# Patient Record
Sex: Male | Born: 1945 | Race: White | Hispanic: No | Marital: Married | Smoking: Former smoker
Health system: Southern US, Community
[De-identification: ages and names within clinical notes are randomized; demographics above are authoritative.]

## PROBLEM LIST (undated history)

## (undated) DIAGNOSIS — E669 Obesity, unspecified: Secondary | ICD-10-CM

## (undated) DIAGNOSIS — I4581 Long QT syndrome: Secondary | ICD-10-CM

## (undated) DIAGNOSIS — R609 Edema, unspecified: Secondary | ICD-10-CM

## (undated) DIAGNOSIS — E079 Disorder of thyroid, unspecified: Secondary | ICD-10-CM

## (undated) DIAGNOSIS — Z950 Presence of cardiac pacemaker: Secondary | ICD-10-CM

## (undated) DIAGNOSIS — R55 Syncope and collapse: Secondary | ICD-10-CM

## (undated) DIAGNOSIS — I442 Atrioventricular block, complete: Secondary | ICD-10-CM

## (undated) DIAGNOSIS — G4733 Obstructive sleep apnea (adult) (pediatric): Secondary | ICD-10-CM

## (undated) DIAGNOSIS — I10 Essential (primary) hypertension: Secondary | ICD-10-CM

## (undated) HISTORY — DX: Atrioventricular block, complete: I44.2

## (undated) HISTORY — DX: Long QT syndrome: I45.81

## (undated) HISTORY — DX: Obstructive sleep apnea (adult) (pediatric): G47.33

## (undated) HISTORY — DX: Disorder of thyroid, unspecified: E07.9

## (undated) HISTORY — DX: Essential (primary) hypertension: I10

## (undated) HISTORY — DX: Syncope and collapse: R55

## (undated) HISTORY — DX: Presence of cardiac pacemaker: Z95.0

## (undated) HISTORY — DX: Obesity, unspecified: E66.9

## (undated) HISTORY — DX: Edema, unspecified: R60.9

---

## 2005-05-23 ENCOUNTER — Ambulatory Visit: Payer: Self-pay | Admitting: Internal Medicine

## 2011-12-12 ENCOUNTER — Encounter: Payer: Self-pay | Admitting: Internal Medicine

## 2011-12-12 ENCOUNTER — Ambulatory Visit (INDEPENDENT_AMBULATORY_CARE_PROVIDER_SITE_OTHER): Payer: Self-pay | Admitting: Internal Medicine

## 2011-12-12 VITALS — BP 140/92 | HR 51 | Ht 72.0 in | Wt 288.0 lb

## 2011-12-12 DIAGNOSIS — I4581 Long QT syndrome: Secondary | ICD-10-CM | POA: Insufficient documentation

## 2011-12-12 DIAGNOSIS — R55 Syncope and collapse: Secondary | ICD-10-CM

## 2011-12-12 DIAGNOSIS — I10 Essential (primary) hypertension: Secondary | ICD-10-CM

## 2011-12-12 DIAGNOSIS — Z87898 Personal history of other specified conditions: Secondary | ICD-10-CM | POA: Insufficient documentation

## 2011-12-12 MED ORDER — SPIRONOLACTONE 25 MG PO TABS: 25.0000 mg | ORAL_TABLET | Freq: Every day | ORAL | Status: DC

## 2011-12-12 NOTE — Assessment & Plan Note (Signed)
The spells that they have described in the past have been relatively long in duration and unlikely related to polymorphic ventricular tachycardia. This most recent episode with his much more abrupt onset and full loss of consciousness could have been. The fact that he was unresponsive for many many minutes would make this less likely and more likely neurally mediated as I think is the mechanism of the previous spells.  However, it's hard to be sure and I recommended the insertion of a loop recorder for diagnostic purposes.

## 2011-12-12 NOTE — Assessment & Plan Note (Signed)
I suggested that he pursue the treatment for his sleep apnea and will change his diuretics as noted above to be potassium sparing.

## 2011-12-12 NOTE — Patient Instructions (Addendum)
Your physician has recommended you make the following change in your medication: STOP TRIAMTERENE/HCTZ AND START ON ALDACTONE (SPIRONOLACTONE) 25 MG 1 TABLET DAILY  Your physician recommends that you return for lab work in: TODAY BMET, MAGNESIUM   Your physician recommends that you schedule a follow-up appointment in: 4-6 WEEKS WITH DR. Graciela Husbands IN THE [place] OFFICE PER DR. Graciela Husbands

## 2011-12-12 NOTE — Progress Notes (Signed)
History and Physical  Patient ID: William Woods MRN: [id number], SOB: August 20, 1946 66 y.o. Date of Encounter: 12/12/2011, 4:30 PM  Primary Physician: Andree Elk, MD, MD Primary Cardiologist:  Primary Electrophysiologist:  sk  Chief Complaint: long QT syndrome  History of Present Illness: William Woods is a 66 y.o. male gentleman who I met in 2006. Just prior to that he had been seen at [facility] because of a chest pain syndrome and an abnormal electro-cardiogram. This prompted catheterization demonstrating no obstructive disease and normal left ventricular function.  He was seen then by Dr. Wynona Luna and was felt to have QT prolongation. His father apparently cured a similar diagnosis. I  He has profound fatigue because of atenolol at that time which I discontinued and he then was referred back to [facility]. There is also a comment in the charts related to hypertrophic heart disease. He had QRS prolongation and I have not seen him since.   He comes in today to reestablish care.  He had an episode of syncope about 4 or 5 weeks ago. He been feeling fine. He lifted a box out of of his trunk and carried it into the house. He weighed about 60 pounds. He put it on the counter and then fainted. He was out for some number of minutes. EMS was called. First vital signs are recorded as normal.  He has 2 children. Both of his sons has died. One of cancer and one for homan autopsy demonstrated cocaine and to which this was attributed. His brother and nephew were apparently evaluated and the diagnosis was unclear.  He has had a couple of other episodes of weakness. These have occurred in the setting of relative dehydration. The first occurred at the beach after he got badly sunburned in the setting of Claritin the other in the context of a low calorie diet.  His modest shortness of breath. He has some peripheral edema. He is being managed currently with potassium wasting diuretic taken in conjunction with  triamterene. He also has gout.   Past Medical History  Diagnosis Date  . Long Q-T syndrome   . Hypertension   . Thyroid disease      Past Surgical History  Procedure Date  . Cardiac catheterization     [facility]       Current Outpatient Prescriptions  Medication Sig Dispense Refill  . anastrozole (ARIMIDEX) 1 MG tablet Take 0.5 mg by mouth daily.      Marland Kitchen thyroid (ARMOUR) 120 MG tablet Take 120 mg by mouth daily.      Marland Kitchen spironolactone (ALDACTONE) 25 MG tablet Take 1 tablet (25 mg total) by mouth daily.  90 tablet  3     Allergies: No Known Allergies   History  Substance Use Topics  . Smoking status: Former Smoker -- 1.0 packs/day for 28 years    Types: Cigarettes  . Smokeless tobacco: Former Neurosurgeon    Quit date: 12/11/1997  . Alcohol Use: No      History reviewed. No pertinent family history.    ROS:  Please see the history of present illness.     All other systems reviewed and negative.   Vital Signs: Blood pressure 140/92, pulse 51, height 6' (1.829 m), weight 288 lb (130.636 kg).  PHYSICAL EXAM: General:  Well nourished, obese Caucasian male in no acute distress  HEENT: normal Lymph: no adenopathy Neck: no JVD Endocrine:  No thryomegaly Vascular: No carotid bruits; FA pulses 2+ bilaterally without bruits Cardiac:  normal S1, S2; RRR; no murmur Back: without kyphosis/scoliosis, no CVA tenderness Lungs:  clear to auscultation bilaterally, no wheezing, rhonchi or rales Abd: soft, nontender, no hepatomegaly Ext: 1-2+ edema Musculoskeletal:  No deformities, BUE and BLE strength normal and equal Skin: warm and dry Neuro:  CNs 2-12 intact, no focal abnormalities noted Psych:  Normal affect   EKG:  Sinus rhythm at 51 with intervals of 0.22/0.15/0.62 with a QTC of 0.55 JT. interval on today's tracings are 500 ms He also is right bundle branch block and left anterior fascicular block in conjunction with voltage criteria suggestive of hypertrophy.  Labs:        ASSESSMENT AND PLAN:     Duke Salvia, MD  12/12/2011, 4:30 PM

## 2011-12-12 NOTE — Assessment & Plan Note (Addendum)
The patient has QT intervals of more than 506 100 ms. This is true even with a mildly broad QRS and QT interval is 500 ms. The family carries a diagnosis of long Q-T I don't know if this is been verified. The patient's sons have both died. There is however very broad family on his father's side who is considered the source of the Gene.  I have discussed with them the importance of reviewing all drugs with the website QT drugs.org. Will discontinue his potassium wasting diuretic and will start him on spironolactone. I have suggested that we undertake his straining. They have no insurance and we would need to use [employer].    We will check his potassium and magnesium today.

## 2012-01-14 ENCOUNTER — Ambulatory Visit: Payer: Self-pay | Admitting: Internal Medicine

## 2012-01-19 ENCOUNTER — Observation Stay: Payer: Self-pay | Admitting: Internal Medicine

## 2012-01-19 DIAGNOSIS — I359 Nonrheumatic aortic valve disorder, unspecified: Secondary | ICD-10-CM

## 2012-01-19 DIAGNOSIS — R55 Syncope and collapse: Secondary | ICD-10-CM

## 2012-01-19 LAB — BASIC METABOLIC PANEL
Anion Gap: 9 (ref 7–16)
BUN: 19 mg/dL — ABNORMAL HIGH (ref 7–18)
Calcium, Total: 8.9 mg/dL (ref 8.5–10.1)
Chloride: 106 mmol/L (ref 98–107)
Co2: 29 mmol/L (ref 21–32)
Creatinine: 1.32 mg/dL — ABNORMAL HIGH (ref 0.60–1.30)
EGFR (African American): >60
EGFR (Non-African Amer.): 58 — ABNORMAL LOW
Glucose: 118 mg/dL — ABNORMAL HIGH (ref 65–99)
Osmolality: 290 (ref 275–301)
Potassium: 4.1 mmol/L (ref 3.5–5.1)
Sodium: 144 mmol/L (ref 136–145)

## 2012-01-19 LAB — APTT: Activated PTT: 27.7 secs (ref 23.6–35.9)

## 2012-01-19 LAB — CBC
HCT: 42 % (ref 40.0–52.0)
HGB: 13.6 g/dL (ref 13.0–18.0)
MCH: 26.4 pg (ref 26.0–34.0)
MCHC: 32.4 g/dL (ref 32.0–36.0)
MCV: 82 fL (ref 80–100)
Platelet: 123 10*3/uL — ABNORMAL LOW (ref 150–440)
RBC: 5.16 10*6/uL (ref 4.40–5.90)
RDW: 14.1 % (ref 11.5–14.5)
WBC: 6.1 10*3/uL (ref 3.8–10.6)

## 2012-01-19 LAB — TROPONIN I
Troponin-I: 0.06 ng/mL — ABNORMAL HIGH
Troponin-I: 0.18 ng/mL — ABNORMAL HIGH

## 2012-01-19 LAB — PRO B NATRIURETIC PEPTIDE: B-Type Natriuretic Peptide: 244 pg/mL — ABNORMAL HIGH (ref 0–125)

## 2012-01-20 DIAGNOSIS — I200 Unstable angina: Secondary | ICD-10-CM

## 2012-01-20 HISTORY — PX: CARDIAC CATHETERIZATION: SHX172

## 2012-01-20 LAB — BASIC METABOLIC PANEL
Anion Gap: 6 — ABNORMAL LOW (ref 7–16)
BUN: 21 mg/dL — ABNORMAL HIGH (ref 7–18)
Calcium, Total: 8.5 mg/dL (ref 8.5–10.1)
Chloride: 107 mmol/L (ref 98–107)
Co2: 30 mmol/L (ref 21–32)
Creatinine: 1.27 mg/dL (ref 0.60–1.30)
EGFR (African American): >60
EGFR (Non-African Amer.): >60
Glucose: 103 mg/dL — ABNORMAL HIGH (ref 65–99)
Osmolality: 288 (ref 275–301)
Potassium: 4.1 mmol/L (ref 3.5–5.1)
Sodium: 143 mmol/L (ref 136–145)

## 2012-01-20 LAB — CBC WITH DIFFERENTIAL/PLATELET
Basophil #: 0 10*3/uL (ref 0.0–0.1)
Basophil %: 0 %
Eosinophil #: 0.1 10*3/uL (ref 0.0–0.7)
Eosinophil %: 1.7 %
HCT: 36.2 % — ABNORMAL LOW (ref 40.0–52.0)
HGB: 12 g/dL — ABNORMAL LOW (ref 13.0–18.0)
Lymphocyte #: 1.9 10*3/uL (ref 1.0–3.6)
Lymphocyte %: 28.7 %
MCH: 26.9 pg (ref 26.0–34.0)
MCHC: 33.1 g/dL (ref 32.0–36.0)
MCV: 81 fL (ref 80–100)
Monocyte #: 0.6 10*3/uL (ref 0.0–0.7)
Monocyte %: 9.3 %
Neutrophil #: 4.1 10*3/uL (ref 1.4–6.5)
Neutrophil %: 60.3 %
Platelet: 120 10*3/uL — ABNORMAL LOW (ref 150–440)
RBC: 4.46 10*6/uL (ref 4.40–5.90)
RDW: 13.9 % (ref 11.5–14.5)
WBC: 6.8 10*3/uL (ref 3.8–10.6)

## 2012-01-20 LAB — HEMOGLOBIN A1C: Hemoglobin A1C: 6.1 % (ref 4.2–6.3)

## 2012-01-20 LAB — LIPID PANEL
Cholesterol: 145 mg/dL (ref 0–200)
HDL Cholesterol: 30 mg/dL — ABNORMAL LOW (ref 40–60)
Ldl Cholesterol, Calc: 96 mg/dL (ref 0–100)
Triglycerides: 95 mg/dL (ref 0–200)
VLDL Cholesterol, Calc: 19 mg/dL (ref 5–40)

## 2012-01-20 LAB — PROTIME-INR
INR: 1
Prothrombin Time: 13.5 secs (ref 11.5–14.7)

## 2012-01-20 LAB — T4, FREE: Free Thyroxine: 0.86 ng/dL (ref 0.76–1.46)

## 2012-01-20 LAB — TSH: Thyroid Stimulating Horm: 0.156 u[IU]/mL — ABNORMAL LOW

## 2012-01-20 LAB — TROPONIN I: Troponin-I: 0.16 ng/mL — ABNORMAL HIGH

## 2012-01-21 ENCOUNTER — Encounter (HOSPITAL_COMMUNITY)
Admission: RE | Disposition: A | Discharge status: Home / Self Care | Payer: Self-pay | Source: Ambulatory Visit | Attending: Internal Medicine

## 2012-01-21 ENCOUNTER — Ambulatory Visit (HOSPITAL_COMMUNITY)
Admission: RE | Admit: 2012-01-21 | Discharge: 2012-01-21 | Disposition: A | Discharge status: Home / Self Care | Payer: Self-pay | Source: Ambulatory Visit | Attending: Internal Medicine | Admitting: Internal Medicine

## 2012-01-21 DIAGNOSIS — R55 Syncope and collapse: Secondary | ICD-10-CM

## 2012-01-21 DIAGNOSIS — I10 Essential (primary) hypertension: Secondary | ICD-10-CM | POA: Insufficient documentation

## 2012-01-21 DIAGNOSIS — E669 Obesity, unspecified: Secondary | ICD-10-CM | POA: Insufficient documentation

## 2012-01-21 HISTORY — PX: LOOP RECORDER IMPLANT: SHX5477

## 2012-01-21 LAB — BASIC METABOLIC PANEL
BUN: 18 mg/dL (ref 6–23)
CO2: 27 mEq/L (ref 19–32)
Calcium: 9.4 mg/dL (ref 8.4–10.5)
Chloride: 106 mEq/L (ref 96–112)
Creatinine, Ser: 1.1 mg/dL (ref 0.50–1.35)
GFR calc Af Amer: 81 mL/min — ABNORMAL LOW (ref >90)
GFR calc non Af Amer: 70 mL/min — ABNORMAL LOW (ref >90)
Glucose, Bld: 100 mg/dL — ABNORMAL HIGH (ref 70–99)
Potassium: 3.9 mEq/L (ref 3.5–5.1)
Sodium: 142 mEq/L (ref 135–145)

## 2012-01-21 LAB — CBC
HCT: 39.2 % (ref 39.0–52.0)
Hemoglobin: 13.1 g/dL (ref 13.0–17.0)
MCH: 26.7 pg (ref 26.0–34.0)
MCHC: 33.4 g/dL (ref 30.0–36.0)
MCV: 79.8 fL (ref 78.0–100.0)
Platelets: 128 10*3/uL — ABNORMAL LOW (ref 150–400)
RBC: 4.91 MIL/uL (ref 4.22–5.81)
RDW: 13.9 % (ref 11.5–15.5)
WBC: 6.4 10*3/uL (ref 4.0–10.5)

## 2012-01-21 LAB — PROTIME-INR
INR: 0.98 (ref 0.00–1.49)
Prothrombin Time: 13.2 seconds (ref 11.6–15.2)

## 2012-01-21 SURGERY — LOOP RECORDER IMPLANT
Anesthesia: LOCAL

## 2012-01-21 MED ORDER — MIDAZOLAM HCL 5 MG/5ML IJ SOLN
INTRAMUSCULAR | Status: AC
  Filled 2012-01-21: qty 5

## 2012-01-21 MED ORDER — CEFAZOLIN SODIUM 1-5 GM-% IV SOLN
INTRAVENOUS | Status: AC
  Filled 2012-01-21: qty 150

## 2012-01-21 MED ORDER — LIDOCAINE HCL (PF) 1 % IJ SOLN
INTRAMUSCULAR | Status: AC
  Filled 2012-01-21: qty 60

## 2012-01-21 MED ORDER — GENTAMICIN SULFATE 40 MG/ML IJ SOLN
INTRAMUSCULAR | Status: DC
  Filled 2012-01-21: qty 2

## 2012-01-21 MED ORDER — FENTANYL CITRATE 0.05 MG/ML IJ SOLN
INTRAMUSCULAR | Status: AC
  Filled 2012-01-21: qty 2

## 2012-01-21 NOTE — Consult Note (Signed)
ELECTROPHYSIOLOGY CONSULT NOTE    Patient ID: William Woods MRN: [id number], DOB/AGE: June 10, 1946 66 y.o.  Admit date: 01/21/2012 Date of Consult: 01-21-2012  Primary Physician: Andree Elk, MD, MD Primary Cardiologist: Dossie Arbour, MD  Reason for Consultation: syncope  HPI: William Woods is a 66 year old male who has had a history of syncope.  I have evaluated him in February after spell of syncope occurring after he had walked to the kitchen. He was unconscious for about 4 or 5 minutes afterwards his recollection.  I had recommended in ILR at that time but the patient declined because of insurance issues  He had an episode 2 weeks ago while walking to church. A typical prodrome. He held onto a pole for about 2 minutes and the symptoms gradually abated. There associated with nausea diaphoresis and recovery fatigue.  This last Sunday, he had a subsequent episode of syncope on Sunday.  He was walking into church and had sudden onset of dizziness, grabbed a pole, and passed out.  He hit his head and was taken to [facility] ER for evaluation. At that time, he underwent echocardiogram and catheterization which was normal.  He also reports that on arrival of EMS, he was bradycardic then had another episode of bradycardia in the ER accompanied by pre-syncope.  He was discharged yesterday from [facility] with plans for ILR implantation today with Dr Graciela Husbands.    Of note, he also has long QT syndrome. Gene testing has not been pursued.     Past Medical History  Diagnosis Date  . Long Q-T syndrome   . Hypertension   . Thyroid disease   . Obesity (BMI 30-39.9)   . Obstructive sleep apnea   . Edema     Suspect diastolic heart failure  . Syncope      Surgical History:  Past Surgical History  Procedure Date  . Cardiac catheterization     [facility]      Prescriptions prior to admission  Medication Sig Dispense Refill  . anastrozole (ARIMIDEX) 1 MG tablet Take 0.5 mg by mouth daily.      . B Complex  Vitamins (VITAMIN B COMPLEX PO) Take 1 tablet by mouth daily.      . cholecalciferol (D-VI-SOL) 400 UNIT/ML LIQD Take 2,000-3,000 Units by mouth daily.      Marland Kitchen co-enzyme Q-10 50 MG capsule Take 50 mg by mouth daily.      . Cyanocobalamin (VITAMIN B-12 PO) Take 750 mg by mouth daily.      Marland Kitchen DHEA 25 MG CAPS Take 25 mg by mouth 2 (two) times a week.      . DOCOSAHEXAENOIC ACID PO Take 350 mg by mouth daily.      . folic acid (FOLVITE) 400 MCG tablet Take 400 mcg by mouth daily.      . LevOCARNitine Fumarate (L-CARNITINE) 200 MG CAPS Take 200 mg by mouth daily.      . Magnesium 200 MG TABS Take 1 tablet by mouth daily.      . Omega-3 Fatty Acids (EPA PO) Take 150 mg by mouth daily.      Marland Kitchen OVER THE COUNTER MEDICATION Take 500 mg by mouth daily. Cherry Extract      . OVER THE COUNTER MEDICATION Take 1 capsule by mouth daily. Calamarine Oil Concentrate      . OVER THE COUNTER MEDICATION Take 80,000 Units by mouth 2 (two) times daily. Serrapeptase 40,000 units      . Pyridoxine HCl (  VITAMIN B-6 PO) Take 30 mg by mouth daily.      Marland Kitchen spironolactone (ALDACTONE) 25 MG tablet Take 1 tablet (25 mg total) by mouth daily.  90 tablet  3  . TESTOSTERONE IM Inject 0.25 mLs into the muscle 2 (two) times a week.      . thyroid (ARMOUR) 120 MG tablet Take 120 mg by mouth daily.         Allergies: No Known Allergies  History   Social History  . Marital Status: Married    Spouse Name: N/A    Number of Children: N/A  . Years of Education: N/A   Occupational History  . Not on file.   Social History Main Topics  . Smoking status: Former Smoker -- 1.0 packs/day for 28 years    Types: Cigarettes  . Smokeless tobacco: Former Neurosurgeon    Quit date: 12/11/1997  . Alcohol Use: No  . Drug Use: No  . Sexually Active: Not on file   Other Topics Concern  . Not on file   Social History Narrative  . 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  Assessment/Impression/Plan  1) the patient has had recurrent syncope with a prolonged duration significant recovery symptoms and at least on one occasion a relatively long prodrome. I don't think these are consistent with long QT and polymorphic ventricular tachycardia given the duration.  As noted previously I think there neurally mediated. Therapeutic interventions depend upon the degree of bradycardia associated with the episode and this can be clarified by implantable loop recorder. Patient has not been demonstrated to be effective except in a small subset of patients with prolonged asystole associated with a clinical event, either supposed to tilt table testing results.  I have reviewed with him the potential limits of a diagnostic tool and reality of a recurrent event being necessary for diagnosis is somewhat disturbing to him, I think he appreciates the limitations at this point.  With further review the potential risks including and primarily related to  infection as well as the untoward events associated with another syncopal episode. He is advised about driving

## 2012-01-21 NOTE — CV Procedure (Signed)
LOOP RECORDER IMPLANT  Preoop Dx syncope Post op Dx same   After routine prep and drape of the left upper chest, lidocaine was infiltrated lateral to the sternum and caudal to the medial aspect of the clavicle. An incision was made and carried down to the layer of the prepectoral fascia and a Paco is formed. Hemostasis was obtained  2 2-0 silk sutures were placed at the cephalad aspect of the pocket and used to secure a reveal XT Medtronic loop recorder serial number  [id number]. The pocket was copiously irrigated with antibiotic containing saline solution hemostasis was assured and the pocket was closed in 3 layers in the normal fashion.  The wound was washed dried and a benzoin Steri-Strip this was applied needle counts sponge counts and instrument counts were correct at the end of the procedure according to the staff. The patient tolerated the procedure without apparent complication.

## 2012-02-04 ENCOUNTER — Encounter (HOSPITAL_COMMUNITY)
Admission: EM | Disposition: A | Discharge status: Home / Self Care | Payer: Self-pay | Source: Ambulatory Visit | Attending: Cardiology

## 2012-02-04 ENCOUNTER — Encounter (HOSPITAL_COMMUNITY): Payer: Self-pay | Admitting: Cardiology

## 2012-02-04 ENCOUNTER — Inpatient Hospital Stay (HOSPITAL_COMMUNITY)
Admission: EM | Admit: 2012-02-04 | Discharge: 2012-02-05 | DRG: 244 | Disposition: A | Discharge status: Home / Self Care | Payer: Self-pay | Source: Ambulatory Visit | Attending: Cardiology | Admitting: Cardiology

## 2012-02-04 ENCOUNTER — Emergency Department (HOSPITAL_COMMUNITY): Payer: Self-pay

## 2012-02-04 DIAGNOSIS — I10 Essential (primary) hypertension: Secondary | ICD-10-CM | POA: Diagnosis not present

## 2012-02-04 DIAGNOSIS — R55 Syncope and collapse: Secondary | ICD-10-CM

## 2012-02-04 DIAGNOSIS — I4581 Long QT syndrome: Secondary | ICD-10-CM

## 2012-02-04 DIAGNOSIS — Z87891 Personal history of nicotine dependence: Secondary | ICD-10-CM

## 2012-02-04 DIAGNOSIS — E669 Obesity, unspecified: Secondary | ICD-10-CM | POA: Diagnosis present

## 2012-02-04 DIAGNOSIS — I442 Atrioventricular block, complete: Secondary | ICD-10-CM

## 2012-02-04 DIAGNOSIS — Z87898 Personal history of other specified conditions: Secondary | ICD-10-CM | POA: Diagnosis not present

## 2012-02-04 DIAGNOSIS — E079 Disorder of thyroid, unspecified: Secondary | ICD-10-CM | POA: Diagnosis present

## 2012-02-04 DIAGNOSIS — R001 Bradycardia, unspecified: Secondary | ICD-10-CM

## 2012-02-04 DIAGNOSIS — I498 Other specified cardiac arrhythmias: Secondary | ICD-10-CM

## 2012-02-04 DIAGNOSIS — G4733 Obstructive sleep apnea (adult) (pediatric): Secondary | ICD-10-CM | POA: Diagnosis present

## 2012-02-04 DIAGNOSIS — Z79899 Other long term (current) drug therapy: Secondary | ICD-10-CM

## 2012-02-04 DIAGNOSIS — Z4509 Encounter for adjustment and management of other cardiac device: Secondary | ICD-10-CM

## 2012-02-04 HISTORY — PX: PACEMAKER INSERTION: SHX728

## 2012-02-04 HISTORY — PX: PERMANENT PACEMAKER INSERTION: SHX5480

## 2012-02-04 HISTORY — PX: LOOP RECORDER EXPLANT: SHX5476

## 2012-02-04 HISTORY — PX: OTHER SURGICAL HISTORY: SHX169

## 2012-02-04 LAB — DIFFERENTIAL
Basophils Absolute: 0 10*3/uL (ref 0.0–0.1)
Basophils Relative: 0 % (ref 0–1)
Eosinophils Absolute: 0.2 10*3/uL (ref 0.0–0.7)
Eosinophils Relative: 2 % (ref 0–5)
Lymphocytes Relative: 28 % (ref 12–46)
Lymphs Abs: 2.7 10*3/uL (ref 0.7–4.0)
Monocytes Absolute: 0.8 10*3/uL (ref 0.1–1.0)
Monocytes Relative: 8 % (ref 3–12)
Neutro Abs: 5.9 10*3/uL (ref 1.7–7.7)
Neutrophils Relative %: 62 % (ref 43–77)

## 2012-02-04 LAB — BASIC METABOLIC PANEL
BUN: 20 mg/dL (ref 6–23)
CO2: 26 mEq/L (ref 19–32)
Calcium: 9.7 mg/dL (ref 8.4–10.5)
Chloride: 103 mEq/L (ref 96–112)
Creatinine, Ser: 1.65 mg/dL — ABNORMAL HIGH (ref 0.50–1.35)
GFR calc Af Amer: 49 mL/min — ABNORMAL LOW (ref >90)
GFR calc non Af Amer: 43 mL/min — ABNORMAL LOW (ref >90)
Glucose, Bld: 104 mg/dL — ABNORMAL HIGH (ref 70–99)
Potassium: 4.5 mEq/L (ref 3.5–5.1)
Sodium: 141 mEq/L (ref 135–145)

## 2012-02-04 LAB — POCT I-STAT, CHEM 8
BUN: 23 mg/dL (ref 6–23)
Calcium, Ion: 1.22 mmol/L (ref 1.12–1.32)
Chloride: 104 mEq/L (ref 96–112)
Creatinine, Ser: 1.8 mg/dL — ABNORMAL HIGH (ref 0.50–1.35)
Glucose, Bld: 110 mg/dL — ABNORMAL HIGH (ref 70–99)
HCT: 47 % (ref 39.0–52.0)
Hemoglobin: 16 g/dL (ref 13.0–17.0)
Potassium: 4.4 mEq/L (ref 3.5–5.1)
Sodium: 142 mEq/L (ref 135–145)
TCO2: 28 mmol/L (ref 0–100)

## 2012-02-04 LAB — MAGNESIUM: Magnesium: 2 mg/dL (ref 1.5–2.5)

## 2012-02-04 LAB — TSH: TSH: 3.074 u[IU]/mL (ref 0.350–4.500)

## 2012-02-04 LAB — PRO B NATRIURETIC PEPTIDE: Pro B Natriuretic peptide (BNP): 743.6 pg/mL — ABNORMAL HIGH (ref 0–125)

## 2012-02-04 LAB — CBC
HCT: 46.2 % (ref 39.0–52.0)
Hemoglobin: 14.8 g/dL (ref 13.0–17.0)
MCH: 26.1 pg (ref 26.0–34.0)
MCHC: 32 g/dL (ref 30.0–36.0)
MCV: 81.6 fL (ref 78.0–100.0)
Platelets: 169 10*3/uL (ref 150–400)
RBC: 5.66 MIL/uL (ref 4.22–5.81)
RDW: 14.4 % (ref 11.5–15.5)
WBC: 9.6 10*3/uL (ref 4.0–10.5)

## 2012-02-04 LAB — TROPONIN I: Troponin I: <0.3 ng/mL (ref <0.30)

## 2012-02-04 LAB — GLUCOSE, CAPILLARY: Glucose-Capillary: 111 mg/dL — ABNORMAL HIGH (ref 70–99)

## 2012-02-04 SURGERY — PERMANENT PACEMAKER INSERTION
Anesthesia: LOCAL

## 2012-02-04 MED ORDER — FOLIC ACID 1 MG PO TABS
1.0000 mg | ORAL_TABLET | Freq: Every day | ORAL | Status: DC
  Administered 2012-02-04 – 2012-02-05 (×2): 1 mg via ORAL
  Filled 2012-02-04 (×2): qty 1

## 2012-02-04 MED ORDER — CEFAZOLIN SODIUM 1-5 GM-% IV SOLN
INTRAVENOUS | Status: AC
  Filled 2012-02-04: qty 50

## 2012-02-04 MED ORDER — DOPAMINE-DEXTROSE 3.2-5 MG/ML-% IV SOLN
INTRAVENOUS | Status: AC
  Administered 2012-02-04: 5 ug/kg/min
  Filled 2012-02-04: qty 250

## 2012-02-04 MED ORDER — ACETAMINOPHEN 325 MG PO TABS: 325.0000 mg | ORAL_TABLET | ORAL | Status: DC | PRN

## 2012-02-04 MED ORDER — VITAMIN B COMPLEX PO TABS: 1.0000 | ORAL_TABLET | Freq: Every day | ORAL | Status: DC

## 2012-02-04 MED ORDER — SODIUM CHLORIDE 0.9 % IV SOLN
INTRAVENOUS | Status: AC
  Filled 2012-02-04: qty 100

## 2012-02-04 MED ORDER — SPIRONOLACTONE 25 MG PO TABS
25.0000 mg | ORAL_TABLET | Freq: Every day | ORAL | Status: DC
  Filled 2012-02-04: qty 1

## 2012-02-04 MED ORDER — FOLIC ACID 400 MCG PO TABS: 400.0000 ug | ORAL_TABLET | Freq: Every day | ORAL | Status: DC

## 2012-02-04 MED ORDER — ONDANSETRON HCL 4 MG/2ML IJ SOLN
INTRAMUSCULAR | Status: AC
  Filled 2012-02-04: qty 2

## 2012-02-04 MED ORDER — HYDROCODONE-ACETAMINOPHEN 5-325 MG PO TABS: 1.0000 | ORAL_TABLET | ORAL | Status: DC | PRN

## 2012-02-04 MED ORDER — CYANOCOBALAMIN 500 MCG PO TABS
750.0000 ug | ORAL_TABLET | Freq: Every day | ORAL | Status: DC
  Filled 2012-02-04: qty 1

## 2012-02-04 MED ORDER — CEFAZOLIN SODIUM 1-5 GM-% IV SOLN
1.0000 g | Freq: Four times a day (QID) | INTRAVENOUS | Status: AC
  Administered 2012-02-04 – 2012-02-05 (×3): 1 g via INTRAVENOUS
  Filled 2012-02-04 (×3): qty 50

## 2012-02-04 MED ORDER — VITAMIN D3 25 MCG (1000 UNIT) PO TABS
2000.0000 [IU] | ORAL_TABLET | Freq: Every day | ORAL | Status: DC
  Administered 2012-02-05: 2000 [IU] via ORAL
  Filled 2012-02-04: qty 2

## 2012-02-04 MED ORDER — NITROGLYCERIN 0.4 MG SL SUBL: 0.4000 mg | SUBLINGUAL_TABLET | SUBLINGUAL | Status: DC | PRN

## 2012-02-04 MED ORDER — ONDANSETRON HCL 4 MG/2ML IJ SOLN: 4.0000 mg | Freq: Four times a day (QID) | INTRAMUSCULAR | Status: DC | PRN

## 2012-02-04 MED ORDER — EPA 1000 MG PO CAPS: 150.0000 mg | ORAL_CAPSULE | Freq: Every day | ORAL | Status: DC

## 2012-02-04 MED ORDER — L-CARNITINE 200 MG PO CAPS: 200.0000 mg | ORAL_CAPSULE | Freq: Every day | ORAL | Status: DC

## 2012-02-04 MED ORDER — CO-ENZYME Q-10 50 MG PO CAPS: 50.0000 mg | ORAL_CAPSULE | Freq: Every day | ORAL | Status: DC

## 2012-02-04 MED ORDER — B COMPLEX-C PO TABS
1.0000 | ORAL_TABLET | Freq: Every day | ORAL | Status: DC
  Administered 2012-02-05: 1 via ORAL
  Filled 2012-02-04: qty 1

## 2012-02-04 MED ORDER — CEFAZOLIN SODIUM 1-5 GM-% IV SOLN
INTRAVENOUS | Status: AC
  Filled 2012-02-04: qty 100

## 2012-02-04 MED ORDER — ZOLPIDEM TARTRATE 5 MG PO TABS: 5.0000 mg | ORAL_TABLET | Freq: Every evening | ORAL | Status: DC | PRN

## 2012-02-04 MED ORDER — PYRIDOXINE HCL 25 MG PO TABS
25.0000 mg | ORAL_TABLET | Freq: Every day | ORAL | Status: DC
  Filled 2012-02-04: qty 1

## 2012-02-04 MED ORDER — ATROPINE SULFATE 1 MG/ML IJ SOLN
INTRAMUSCULAR | Status: AC
  Administered 2012-02-04: 17:00:00 via INTRAVENOUS
  Filled 2012-02-04: qty 1

## 2012-02-04 MED ORDER — MAGNESIUM OXIDE 400 MG PO TABS
200.0000 mg | ORAL_TABLET | Freq: Every day | ORAL | Status: DC
  Administered 2012-02-04 – 2012-02-05 (×2): 200 mg via ORAL
  Filled 2012-02-04 (×2): qty 0.5

## 2012-02-04 MED ORDER — SODIUM CHLORIDE 0.9 % IJ SOLN: 3.0000 mL | INTRAMUSCULAR | Status: DC | PRN

## 2012-02-04 MED ORDER — ATROPINE SULFATE 1 MG/ML IJ SOLN
INTRAMUSCULAR | Status: AC
  Filled 2012-02-04: qty 2

## 2012-02-04 MED ORDER — THYROID 120 MG PO TABS
120.0000 mg | ORAL_TABLET | Freq: Every day | ORAL | Status: DC
  Administered 2012-02-04 – 2012-02-05 (×2): 120 mg via ORAL
  Filled 2012-02-04 (×2): qty 1

## 2012-02-04 MED ORDER — CALCIUM GLUCONATE 10 % IV SOLN
INTRAVENOUS | Status: AC
  Administered 2012-02-04: 17:00:00
  Filled 2012-02-04: qty 10

## 2012-02-04 MED ORDER — SODIUM CHLORIDE 0.9 % IJ SOLN
3.0000 mL | Freq: Two times a day (BID) | INTRAMUSCULAR | Status: DC
  Administered 2012-02-04 – 2012-02-05 (×2): 3 mL via INTRAVENOUS

## 2012-02-04 MED ORDER — FENTANYL CITRATE 0.05 MG/ML IJ SOLN
INTRAMUSCULAR | Status: AC
Start: 2012-02-04 — End: 2012-02-04
  Filled 2012-02-04: qty 2

## 2012-02-04 MED ORDER — SODIUM CHLORIDE 0.9 % IV SOLN: 250.0000 mL | INTRAVENOUS | Status: DC | PRN

## 2012-02-04 MED ORDER — MAGNESIUM 200 MG PO TABS: 1.0000 | ORAL_TABLET | Freq: Every day | ORAL | Status: DC

## 2012-02-04 MED ORDER — CHOLECALCIFEROL 400 UNIT/ML PO LIQD: 2000.0000 [IU] | Freq: Every day | ORAL | Status: DC

## 2012-02-04 MED ORDER — ALPRAZOLAM 0.25 MG PO TABS: 0.2500 mg | ORAL_TABLET | Freq: Two times a day (BID) | ORAL | Status: DC | PRN

## 2012-02-04 MED ORDER — ANASTROZOLE 1 MG PO TABS
0.5000 mg | ORAL_TABLET | Freq: Every day | ORAL | Status: DC
  Filled 2012-02-04 (×2): qty 1

## 2012-02-04 MED ORDER — ACETAMINOPHEN 325 MG PO TABS: 650.0000 mg | ORAL_TABLET | ORAL | Status: DC | PRN

## 2012-02-04 MED ORDER — DHEA 25 MG PO CAPS: 25.0000 mg | ORAL_CAPSULE | ORAL | Status: DC

## 2012-02-04 MED ORDER — SODIUM CHLORIDE 0.9 % IR SOLN
Status: DC
  Filled 2012-02-04: qty 2

## 2012-02-04 MED ORDER — LIDOCAINE HCL (PF) 1 % IJ SOLN
INTRAMUSCULAR | Status: AC
  Filled 2012-02-04: qty 30

## 2012-02-04 NOTE — Progress Notes (Signed)
PHARMACIST - PHYSICIAN ORDER COMMUNICATION  CONCERNING: P&T Medication Policy on Herbal Medications  DESCRIPTION:  This patient's order for: dhea, levocarnitine has been noted.  This product(s) is classified as an "herbal" or natural product. Due to a lack of definitive safety studies or FDA approval, nonstandard manufacturing practices, plus the potential risk of unknown drug-drug interactions while on inpatient medications, the Pharmacy and Therapeutics Committee does not permit the use of "herbal" or natural products of this type within [facility].   ACTION TAKEN: The pharmacy department is unable to verify this order at this time and your patient has been informed of this safety policy. Please reevaluate patient's clinical condition at discharge and address if the herbal or natural product(s) should be resumed at that time.

## 2012-02-04 NOTE — ED Notes (Addendum)
Pt had a HR of 26-30 at 1530 taken by [place]. Initial VS BP 126/90 1515, 127/62 1535, 100/80 1600. O2 at 97% 4L. 20 gauge in the right hand tolerated well. History of implanted cardiac monitor, no pacer or debrillator. Dr. Graciela Husbands placed it in 2 weeks ago. Allergic to codeine and several others.

## 2012-02-04 NOTE — ED Notes (Signed)
EDP to the bedside to evaluate pt at this time. Placed on the zole pads. Pt denies any chest pain at this time. Wife at the bedside.

## 2012-02-04 NOTE — ED Notes (Addendum)
Pt reports that they placed a loop monitor by cardiology by Dr. Alberteen Spindle. Rhonda Barrett at the bedside to evaluate pt.

## 2012-02-04 NOTE — ED Notes (Signed)
Pt taken to cath at this time on zole, cardiology team at the bedside to transport

## 2012-02-04 NOTE — Op Note (Signed)
SURGEON:  Hillis Range, MD     PREPROCEDURE DIAGNOSIS:  Syncope and complete heart block    POSTPROCEDURE DIAGNOSIS:  Syncope and complete heart block     PROCEDURES:   1. Pacemaker implantation.  2. Implantable loop recorder removal     INTRODUCTION: William Woods is a 66 y.o. male  with a history of recurrent unexplained syncope.  He recently underwent loop recorder implant by Dr Graciela Husbands.  He now presents with recurrent syncope and complete heart block.  His ventricular rate is in the 20s.  No reversible causes have been identified.  The patient therefore presents today for emergent pacemaker implantation with subsequent implantable loop recorder removal (per the patient's request).     DESCRIPTION OF PROCEDURE:  Informed written consent was obtained, and the patient was brought to the electrophysiology lab in a fasting state.  The patient received IV fentanyl for the procedure today.  The patients left chest was prepped and draped in the usual sterile fashion by the EP lab staff. The skin overlying the left deltopectoral region was infiltrated with lidocaine for local analgesia.  A 4-cm incision was made over the left deltopectoral region.  A left subcutaneous pacemaker pocket was fashioned using a combination of sharp and blunt dissection. Electrocautery was required to assure hemostasis.    RA/RV Lead Placement: The left axillary vein was therefore visualized and cannulated.   Through the left axillary vein, a Medtronic model [id number] (serial number [id number]) right atrial lead and a Medtronic model 5076- 58 (serial number [id number]) right ventricular lead were advanced with fluoroscopic visualization into the right atrial appendage and right ventricular apex positions respectively.  The patient had intermittent ventricular standstill during lead placement.  Initial atrial lead P- waves measured 10mV with impedance of 693 ohms and a threshold of 1.3 V at 0.5 msec.  Right ventricular lead  R-waves measured 6.6 mV with an impedance of 767 ohms and a threshold of  0.4 V at 0.5 msec.  Both leads were secured to the pectoralis fascia using #2-0 silk over the suture sleeves.   Device Placement:  The leads were then connected to a Medtronic Adapta L model ADDRL 1 (serial number [id number]) pacemaker.  The pocket was irrigated with copious gentamicin solution.  The pacemaker was then placed into the pocket.  The pocket was then closed in 2 layers with 2.0 Vicryl suture for the subcutaneous and subcuticular layers.  Steri-Strips and a sterile dressing were then applied.  There were no early apparent complications.   Implantable loop recorder removal: The skin overlying the implantable loop recorder (ILR) was infiltrated with lidocaine for local analgesia.  A 1.5-cm incision was made over the ILR implant site. Using a combination of sharp and blunt dissection, device was exposed.  2 separate #2 silk sutures were identified and removed in their entirety.  The Reveal XT ILR device was then removed from the pocket.  Electrocautery was required to assure hemostasis. The pocket was irrigated with copious gentamicin solution. The pocket was then closed in 2 layers with 2.0 Vicryl suture for the subcutaneous and subcuticular layers.  Steri-Strips and a sterile dressing were then applied.  There were no early apparent complications.    CONCLUSIONS:   1. Successful implantation of a Medtronic Adapta L dual-chamber pacemaker for syncope and complete heart block  2. Successful removal of a previously implanted MDT Reveal XT implantable loop recorder  3. No early apparent complications.  Hillis Range, MD 02/04/2012 6:23 PM

## 2012-02-04 NOTE — ED Notes (Signed)
CBG 111 @ 1617

## 2012-02-04 NOTE — H&P (Signed)
History and Physical  Patient ID: William Woods Patient ID: William Woods MRN: [id number], DOB/AGE: 1946/06/28 66 y.o. Date of Encounter: 02/04/2012  Primary Physician: Andree Elk, MD, MD Primary Cardiologist: TG/SK  Chief Complaint:  Weakness, CHB  HPI: William Woods is a 66 year old male with a history of syncope and long QT who had a loop recorder implanted on 01/21/2012.   Today, pt was doing well but was cleaning his car and bent over. When he stood up, he felt dizzy. He was taken home by staff and was checked on by 2 neighbors who are nurses as well as his wife They found his HR to be in the 30s and called EMS. In the ER, his heart rate is 19 and he has a ventricular escape rhythm. He is alert and oriented, he denies chest pain but is slightly SOB and is diaphoretic. He feels weak and is presyncopal. His HR did not respond to Atropine or Dopamine. This is the first episode since the loop recorder was implanted.  Past Medical History  Diagnosis Date  . Long Q-T syndrome   . Hypertension   . Thyroid disease   . Obesity (BMI 30-39.9)   . Obstructive sleep apnea   . Edema     Suspect diastolic heart failure  . Syncope      Surgical History:  Past Surgical History  Procedure Date  . Cardiac catheterization     [facility] March 2013     I have reviewed the patient's current medications.  Current facility-administered medications: 1. atropine 1 MG/ML injection,   2. calcium gluconate 10 % injection, , , , ;  3. DOPamine (INTROPIN) 3.2-5 MG/ML-% infusion, , , , ;   4. sodium chloride 0.9 % infusion, , , ,   Current outpatient prescriptions: 1. anastrozole (ARIMIDEX) 1 MG tablet, Take 0.5 mg by mouth daily., Disp: , Rfl: ;   2. B Complex Vitamins (VITAMIN B COMPLEX PO), Take 1 tablet by mouth daily.,  3. cholecalciferol (D-VI-SOL) 400 UNIT/ML LIQD, Take 2,000-3,000 Units by mouth daily. 4.  co-enzyme Q-10 50 MG capsule, Take 50 mg by mouth daily., Disp: , Rfl:    5. Cyanocobalamin (VITAMIN B-12 PO), Take 750 mg by mouth daily., Disp: , Rfl: ;  6. DHEA 25 MG CAPS, Take 25 mg by mouth 2 (two) times a week., Disp: , Rfl: ;  7. DOCOSAHEXAENOIC ACID PO, Take 350 mg by mouth daily., Disp: , Rfl: ;  8. folic acid (FOLVITE) 400 MCG tablet, Take 400 mcg by mouth daily., Disp: , Rfl: ;  9. LevOCARNitine Fumarate (L-CARNITINE) 200 MG CAPS, Take 200 mg by mouth daily., 10. Magnesium 200 MG TABS, Take 1 tablet by mouth daily., Disp: , Rfl: ;  11. Omega-3 Fatty Acids (EPA PO), Take 150 mg by mouth daily., Disp: , Rfl: ;   12. Cherry Extract Take 500 mg by mouth daily. , Disp: , Rfl: ;   13. Calamarine Oil ConcentrateTake 1 capsule by mouth daily. ,  14. Serrapeptase 40,000 units  Take 80,000 Units by mouth 2 (two) times daily., 15. Pyridoxine HCl (VITAMIN B-6 PO), Take 30 mg by mouth daily., ;   16. spironolactone (ALDACTONE) 25 MG tablet, Take 1 tablet (25 mg total) by mouth daily. 17. TESTOSTERONE IM, Inject 0.25 mLs into the muscle 2 (two) times a week.,  18. thyroid (ARMOUR) 120 MG tablet, Take 120 mg by mouth daily.,    Allergies: No Known Allergies  History   Social History  . Marital Status: Married    Spouse Name: N/A    Number of Children: N/A  . Years of Education: N/A   Occupational History  . Business Owner   Social History Main Topics  . Smoking status: Former Smoker -- 1.0 packs/day for 28 years    Types: Cigarettes  . Smokeless tobacco: Former Neurosurgeon    Quit date: 12/11/1997  . Alcohol Use: No  . Drug Use: No  . Sexually Active: Not on file   Other Topics Concern  . Does not exercise   Social History Narrative and Family History   Pt owns [employer] Woods. Quit tobacco at age 85. He lives with his wife. Pt is not active..the patient father had a history of long QT and syncope and died at age 49. His mother died at age 39 of cancer. No siblings have/had cardiac issues.   Review of Systems: No recent illnesses, fevers or  chills.  He has no bleeding issues or problems. He has a poor diet. He does not have regular GI issues. Full 14-point review of systems otherwise negative except as noted above.   Physical Exam: Blood pressure 115/70, pulse 23, resp. rate 20, SpO2 100.00%. General: Well developed, well nourished, male in no acute distress. Head: Normocephalic, atraumatic, sclera non-icteric, no xanthomas, nares are without discharge. Dentition: good Neck: No carotid bruits. JVD not elevated. No thyromegally Lungs: Good expansion bilaterally without wheezes or rhonchi. No Rales Heart: Very slow but Regular rate and rhythm with S1 S2.  No S3 or S4.  No murmur, no rubs, or gallops appreciated. Abdomen: Soft, non-tender, non-distended with normoactive bowel sounds. No hepatomegaly. No rebound/guarding. No obvious abdominal masses. Msk:  Strength and tone appear normal for age. No joint deformities or effusions, no spine or costo-vertebral angle tenderness. Extremities: No clubbing or cyanosis. No edema.  Distal pedal pulses are 2+ and equal bilaterally. Neuro: Alert and oriented X 3. Moves all extremities spontaneously. No focal deficits noted. Psych:  Responds to questions appropriately with a normal affect. Skin: No rashes or lesions noted  Labs: pending  Radiology/Studies:  Pending   ECG: CHB, rate 20  ASSESSMENT AND PLAN:  Principal Problem:  *Complete heart block Active Problems:  Syncope  Long Q-T syndrome  Hypertension  Plan: William Woods has symptomatic bradycardia and needs a PPM. He is currently mentating and has a BP but he is very unstable. Dr Johney Frame and Dr Myrtis Ser discussed the situation with William Woods. The risks and benefits of a pacemaker including, but not limited to, death, stroke, MI, pneumothorax, pericardial effusion, kidney damage and bleeding were discussed with the patient and his wife who indicate understanding and agree to proceed.    Signed,  William Loser Barrett  PA-C 02/04/2012, 4:38 PM   The patient presents with recurrent unexplained syncope.  Previously s/p ILR by Dr Graciela Husbands.  He now presents with complete heart block and profound bradycardia.  No reversible causes have been found.  ON exam, pt is diaphoretic and ill appearing.  I agree with exam as per Ms Woods. I would recommend pacemaker implantation at this time.  He requests that his implantable loop recorder also be removed at the same time. Risks, benefits, alternatives to pacemaker implantation and implantable loop removal were discussed in detail with the patient today. The patient understands that the risks include but are not limited to bleeding, infection, pneumothorax, perforation, tamponade, vascular damage, renal failure, MI, stroke, death,  and  lead dislodgement and wishes to proceed. We will therefore proceed at this time.   Fayrene Fearing Romeka Scifres,MD 02/04/2012

## 2012-02-04 NOTE — ED Provider Notes (Signed)
History     CSN: [id number]  Arrival date & time 02/04/12  1605   None     Chief Complaint  Patient presents with  . Bradycardia     Patient is a 66 y.o. male presenting with syncope. The history is provided by the patient and a relative. No language interpreter was used.  Loss of Consciousness This is a recurrent problem. The current episode started today. The problem occurs rarely. The problem has been unchanged. Pertinent negatives include no abdominal pain, anorexia, arthralgias, change in bowel habit, chest pain, chills, congestion, coughing, diaphoresis, fatigue, fever, headaches, joint swelling, myalgias, nausea, neck pain, numbness, rash, sore throat, swollen glands, urinary symptoms, visual change, vomiting or weakness. Associated symptoms comments: Dizzy . The symptoms are aggravated by bending. He has tried nothing for the symptoms.    Past Medical History  Diagnosis Date  . Long Q-T syndrome   . Hypertension   . Thyroid disease   . Obesity (BMI 30-39.9)   . Obstructive sleep apnea   . Edema     Suspect diastolic heart failure  . Syncope     Past Surgical History  Procedure Date  . Cardiac catheterization     [facility]     History reviewed. No pertinent family history.  History  Substance Use Topics  . Smoking status: Former Smoker -- 1.0 packs/day for 28 years    Types: Cigarettes  . Smokeless tobacco: Former Neurosurgeon    Quit date: 12/11/1997  . Alcohol Use: No      Review of Systems  Constitutional: Negative for fever, chills, diaphoresis and fatigue.  HENT: Negative for congestion, sore throat, neck pain and neck stiffness.   Eyes: Negative for visual disturbance.  Respiratory: Negative for cough, chest tightness and shortness of breath.   Cardiovascular: Positive for syncope. Negative for chest pain, palpitations and leg swelling.  Gastrointestinal: Negative for nausea, vomiting, abdominal pain, diarrhea, constipation, blood in stool, abdominal  distention, anorexia and change in bowel habit.  Genitourinary: Negative for dysuria, urgency, hematuria and difficulty urinating.  Musculoskeletal: Negative for myalgias, back pain, joint swelling, arthralgias and gait problem.  Skin: Negative for rash.  Neurological: Positive for dizziness. Negative for tremors, seizures, syncope, facial asymmetry, speech difficulty, weakness, light-headedness, numbness and headaches.  Hematological: Negative for adenopathy. Does not bruise/bleed easily.  Psychiatric/Behavioral: Negative for confusion.    Allergies  Review of patient's allergies indicates no known allergies.  Home Medications   Current Outpatient Rx  Name Route Sig Dispense Refill  . ANASTROZOLE 1 MG PO TABS Oral Take 0.5 mg by mouth daily.    Marland Kitchen VITAMIN B COMPLEX PO Oral Take 1 tablet by mouth daily.    . CHOLECALCIFEROL 400 UNIT/ML PO LIQD Oral Take 2,000-3,000 Units by mouth daily.    Marland Kitchen CO-ENZYME Q-10 50 MG PO CAPS Oral Take 50 mg by mouth daily.    Marland Kitchen VITAMIN B-12 PO Oral Take 750 mg by mouth daily.    Marland Kitchen DHEA 25 MG PO CAPS Oral Take 25 mg by mouth 2 (two) times a week.    . DOCOSAHEXAENOIC ACID PO Oral Take 350 mg by mouth daily.    Marland Kitchen FOLIC ACID 400 MCG PO TABS Oral Take 400 mcg by mouth daily.    Marland Kitchen L-CARNITINE 200 MG PO CAPS Oral Take 200 mg by mouth daily.    Marland Kitchen MAGNESIUM 200 MG PO TABS Oral Take 1 tablet by mouth daily.    . EPA PO Oral Take 150 mg by  mouth daily.    Marland Kitchen OVER THE COUNTER MEDICATION Oral Take 500 mg by mouth daily. Cherry Extract    . OVER THE COUNTER MEDICATION Oral Take 1 capsule by mouth daily. Calamarine Oil Concentrate    . OVER THE COUNTER MEDICATION Oral Take 80,000 Units by mouth 2 (two) times daily. Serrapeptase 40,000 units    . VITAMIN B-6 PO Oral Take 30 mg by mouth daily.    Marland Kitchen SPIRONOLACTONE 25 MG PO TABS Oral Take 1 tablet (25 mg total) by mouth daily. 90 tablet 3  . TESTOSTERONE IM Intramuscular Inject 0.25 mLs into the muscle 2 (two) times a week.     . THYROID 120 MG PO TABS Oral Take 120 mg by mouth daily.      There were no vitals taken for this visit.  Physical Exam  Constitutional: He is oriented to person, place, and time. He appears well-developed and well-nourished. No distress.  HENT:  Head: Normocephalic.  Eyes: Conjunctivae are normal.  Neck: Normal range of motion. Neck supple. No JVD present.  Cardiovascular: Normal heart sounds, intact distal pulses and normal pulses.  An irregular rhythm present. Bradycardia present.   No murmur heard. Pulmonary/Chest: Effort normal and breath sounds normal. No respiratory distress. He has no wheezes. He has no rales. He exhibits no tenderness.  Abdominal: Soft. Bowel sounds are normal. He exhibits no distension and no mass. There is no tenderness. There is no rebound and no guarding.  Musculoskeletal: Normal range of motion. He exhibits no edema and no tenderness.  Neurological: He is alert and oriented to person, place, and time.  Skin: Skin is warm and dry. He is not diaphoretic.  Psychiatric: He has a normal mood and affect.    ED Course  Procedures (including critical care time)  Labs Reviewed  BASIC METABOLIC PANEL - Abnormal; Notable for the following:    Glucose, Bld 104 (*)    Creatinine, Ser 1.65 (*)    GFR calc non Af Amer 43 (*)    GFR calc Af Amer 49 (*)    All other components within normal limits  PRO B NATRIURETIC PEPTIDE - Abnormal; Notable for the following:    Pro B Natriuretic peptide (BNP) 743.6 (*)    All other components within normal limits  POCT I-STAT, CHEM 8 - Abnormal; Notable for the following:    Creatinine, Ser 1.80 (*)    Glucose, Bld 110 (*)    All other components within normal limits  GLUCOSE, CAPILLARY - Abnormal; Notable for the following:    Glucose-Capillary 111 (*)    All other components within normal limits  CBC  DIFFERENTIAL  TROPONIN I  TSH  MAGNESIUM  LIPID PANEL   No results found.   1. Symptomatic bradycardia   2.  Long Q-T syndrome   3. Complete heart block   4. Syncope     MDM  Pt is a 66yo M with PMH of bradycardia with a loop monitor recently implanted in chest who was brought by EMS for dizziness at home while cleaning out floor mats in car. HR noted to be 19. Pt A&Ox3 in NAD. BP stable. HR 19 in exam room with EKG showing complete heart block. Cardiology emergently consulted. Atropine given without improvement. Minimal improvement with dopamine gtt. No pacing initiated in ED. Pt taken emergently to cath lab.     Date: 02/05/2012  Rate: 18  Rhythm: sinus bradycardia  QRS Axis: left  Intervals: QT prolonged  ST/T Wave abnormalities:  nonspecific T wave changes  Conduction Disutrbances:complete heart block  Narrative Interpretation:   Old EKG Reviewed: none available       Consuello Masse, MD 02/05/12 4098  Consuello Masse, MD 02/05/12 1191

## 2012-02-04 NOTE — H&P (Signed)
  The patient has symptomatic recurrent syncope.  He now presents with complete heart block and a ventricular escape in the 20s.  I would therefore recommend pacemaker implantation at this time.  Risks, benefits, alternatives to pacemaker implantation were discussed in detail with the patient and his spouse today. The patient understands that the risks include but are not limited to bleeding, infection, pneumothorax, perforation, tamponade, vascular damage, renal failure, MI, stroke, death,  and lead dislodgement and wishes to proceed. We will therefore proceed with PPM at this time.  Please see my H&P with Theodore Demark for full details.

## 2012-02-04 NOTE — ED Notes (Signed)
Dr. Clide Cliff at the bedside to assess pt. Remains on cardiac monitor.

## 2012-02-04 NOTE — ED Notes (Signed)
I-Stat Chem 8 results handed to [employer]/gallery exhibitions officer)

## 2012-02-04 NOTE — ED Notes (Signed)
Dopamine 10mcg/kg/min

## 2012-02-05 ENCOUNTER — Inpatient Hospital Stay (HOSPITAL_COMMUNITY): Payer: Self-pay

## 2012-02-05 ENCOUNTER — Encounter: Payer: Self-pay | Admitting: *Deleted

## 2012-02-05 LAB — BASIC METABOLIC PANEL
BUN: 23 mg/dL (ref 6–23)
CO2: 30 mEq/L (ref 19–32)
Calcium: 8.9 mg/dL (ref 8.4–10.5)
Chloride: 102 mEq/L (ref 96–112)
Creatinine, Ser: 1.62 mg/dL — ABNORMAL HIGH (ref 0.50–1.35)
GFR calc Af Amer: 51 mL/min — ABNORMAL LOW (ref >90)
GFR calc non Af Amer: 44 mL/min — ABNORMAL LOW (ref >90)
Glucose, Bld: 99 mg/dL (ref 70–99)
Potassium: 3.9 mEq/L (ref 3.5–5.1)
Sodium: 141 mEq/L (ref 135–145)

## 2012-02-05 LAB — LIPID PANEL
Cholesterol: 161 mg/dL (ref 0–200)
HDL: 34 mg/dL — ABNORMAL LOW (ref >39)
LDL Cholesterol: 104 mg/dL — ABNORMAL HIGH (ref 0–99)
Total CHOL/HDL Ratio: 4.7 RATIO
Triglycerides: 115 mg/dL (ref <150)
VLDL: 23 mg/dL (ref 0–40)

## 2012-02-05 MED ORDER — ANASTROZOLE 1 MG PO TABS: 0.5000 mg | ORAL_TABLET | Freq: Every day | ORAL | Status: DC

## 2012-02-05 MED ORDER — SPIRONOLACTONE 25 MG PO TABS: 25.0000 mg | ORAL_TABLET | Freq: Every day | ORAL | Status: DC

## 2012-02-05 MED ORDER — ANASTROZOLE 1 MG PO TABS: 0.5000 mg | ORAL_TABLET | Freq: Every day | ORAL | Status: AC

## 2012-02-05 MED ORDER — METOPROLOL SUCCINATE ER 25 MG PO TB24: 25.0000 mg | ORAL_TABLET | Freq: Every day | ORAL | Status: DC

## 2012-02-05 MED ORDER — THYROID 120 MG PO TABS: 120.0000 mg | ORAL_TABLET | Freq: Every day | ORAL | Status: DC

## 2012-02-05 NOTE — Progress Notes (Signed)
Orthopedic Tech Progress Note Patient Details:  William Woods [birth year] [id number]  Patient ID: [name], male   DOB: [birth year], 66 y.o.   MRN: [id number] Confirmed patient has arm sling with pt nurse.  Tajai Ihde T 02/05/2012, 1:50 PM

## 2012-02-05 NOTE — Discharge Instructions (Signed)
   Supplemental Discharge Instructions for  Pacemaker Patients  Activity No heavy lifting or vigorous activity with your left/right arm for 6 to 8 weeks.  Do not raise your left/right arm above your head for one week.  Gradually raise your affected arm as drawn below.            02/09/12                            02/10/12                   02/11/12                   02/12/12  NO DRIVING until cleared by your heart doctor WOUND CARE   Keep the wound area clean and dry.  Do not get this area wet for one week. No showers for one week; you may shower on 02/12/12   The tape/steri-strips on your wound will fall off; do not pull them off.  No bandage is needed on the site.  DO  NOT apply any creams, oils, or ointments to the wound area.   If you notice any drainage or discharge from the wound, any swelling or bruising at the site, or you develop a fever > 101? F after you are discharged home, call the office at once.  Special Instructions   You are still able to use cellular telephones; use the ear opposite the side where you have your pacemaker/defibrillator.  Avoid carrying your cellular phone near your device.   When traveling through airports, show security personnel your identification card to avoid being screened in the metal detectors.  Ask the security personnel to use the hand wand.   Avoid arc welding equipment, MRI testing (magnetic resonance imaging), TENS units (transcutaneous nerve stimulators).  Call the office for questions about other devices.   Avoid electrical appliances that are in poor condition or are not properly grounded.   Microwave ovens are safe to be near or to operate.

## 2012-02-05 NOTE — Progress Notes (Addendum)
SUBJECTIVE: The patient is doing well today.  At this time, he denies chest pain, shortness of breath, or any new concerns.     Marland Kitchen anastrozole  0.5 mg Oral Daily  . atropine      . B-complex with vitamin C  1 tablet Oral Daily  . calcium gluconate      . ceFAZolin      . ceFAZolin      .  ceFAZolin (ANCEF) IV  1 g Intravenous Q6H  . cholecalciferol  2,000 Units Oral Daily  . DOPamine      . fentaNYL      . folic acid  1 mg Oral Daily  . lidocaine      . magnesium oxide  200 mg Oral Daily  . ondansetron      . ondansetron      . sodium chloride      . sodium chloride  3 mL Intravenous Q12H  . thyroid  120 mg Oral Daily  . DISCONTD: cholecalciferol  2,000-3,000 Units Oral Daily  . DISCONTD: co-enzyme Q-10  50 mg Oral Daily  . DISCONTD: DHEA  25 mg Oral 2 times weekly  . DISCONTD: EPA  150 mg Oral Daily  . DISCONTD: folic acid  400 mcg Oral Daily  . DISCONTD: gentamicin irrigation   Irrigation To Cath  . DISCONTD: L-Carnitine  200 mg Oral Daily  . DISCONTD: Magnesium  1 tablet Oral Daily  . DISCONTD: pyridOXINE  25 mg Oral Daily  . DISCONTD: spironolactone  25 mg Oral Daily  . DISCONTD: Vitamin B Complex  1 tablet Oral Daily  . DISCONTD: vitamin B-12  750 mcg Oral Daily      OBJECTIVE: Physical Exam: Filed Vitals:   02/05/12 0600 02/05/12 0700 02/05/12 0733 02/05/12 0800  BP: 125/64 134/66  135/67  Pulse: 64  80 79  Temp:   98 F (36.7 C)   TempSrc:   Oral   Resp: 15  16 22   Height:      Weight:  292 lb 5.3 oz (132.6 kg)    SpO2: 96%  94% 96%    Intake/Output Summary (Last 24 hours) at 02/05/12 0827 Last data filed at 02/05/12 0400  Gross per 24 hour  Intake  306.5 ml  Output      0 ml  Net  306.5 ml    Telemetry reveals sinus rhythm, V paced  GEN- The patient is well appearing, alert and oriented x 3 today.   Head- normocephalic, atraumatic Eyes-  Sclera clear, conjunctiva pink Ears- hearing intact Oropharynx- clear Pacemaker site without  hematoma Lungs- Clear to ausculation bilaterally, normal work of breathing Heart- Regular rate and rhythm, no murmurs, rubs or gallops, PMI not laterally displaced GI- soft, NT, ND, + BS Extremities- no clubbing, cyanosis, or edema   LABS: Basic Metabolic Panel:  Basename 02/04/12 2120 02/04/12 1644 02/04/12 1631  NA -- 142 141  K -- 4.4 4.5  CL -- 104 103  CO2 -- -- 26  GLUCOSE -- 110* 104*  BUN -- 23 20  CREATININE -- 1.80* 1.65*  CALCIUM -- -- 9.7  MG 2.0 -- --  PHOS -- -- --   Liver Function Tests: No results found for this basename: AST:2,ALT:2,ALKPHOS:2,BILITOT:2,PROT:2,ALBUMIN:2 in the last 72 hours No results found for this basename: LIPASE:2,AMYLASE:2 in the last 72 hours CBC:  Basename 02/04/12 1644 02/04/12 1631  WBC -- 9.6  NEUTROABS -- 5.9  HGB 16.0 14.8  HCT 47.0 46.2  MCV --  81.6  PLT -- 169   Cardiac Enzymes:  Basename 02/04/12 1632  CKTOTAL --  CKMB --  CKMBINDEX --  TROPONINI <0.30  Fasting Lipid Panel:  Basename 02/05/12 0513  CHOL 161  HDL 34*  LDLCALC 104*  TRIG 115  CHOLHDL 4.7  LDLDIRECT --   Thyroid Function Tests:  Basename 02/04/12 1631  TSH 3.074  T4TOTAL --  T3FREE --  THYROIDAB --   Anemia Panel: No results found for this basename: VITAMINB12,FOLATE,FERRITIN,TIBC,IRON,RETICCTPCT in the last 72 hours  RADIOLOGY: Dg Chest 2 View  02/05/2012  *RADIOLOGY REPORT*  Clinical Data: Pacemaker insertion.  CHEST - 2 VIEW  Comparison: None.  Findings: Pacer is in place with leads in the right atrium and right ventricle.  No pneumothorax.  There is cardiomegaly.  Lungs are clear.  No effusions.  No acute bony abnormality.  IMPRESSION: Cardiomegaly.  Pacer as above.  No pneumothorax.  Original Report Authenticated By: Cyndie Chime, M.D.    ASSESSMENT AND PLAN:  Principal Problem:  *Complete heart block Active Problems:  Syncope  Long Q-T syndrome  Hypertension  1. AV block- doing well s/p PPM Routine wound care advised His  wife reports that he did not adhere to Dr Koren Bound' recommendation regarding wound care for his ILR.  I specifically stressed the importance of appropriate wound care with pt to prevent infection/ complications. Device interrogationt today is reviewed and normal   2. Possible Long QT- with pace at 80 bpm. Add toprol xl 25mg  daily Follow-up with Dr Graciela Husbands for further management  3. Syncope- no driving until he follows up with Dr Graciela Husbands  4. CRI- BMET this am No contrast used for PPM implant  If doing well, could go home late this afternoon. Wound check in 10 days.  Follow-up with Dr Graciela Husbands in [place] in 3 months.    Hillis Range, MD 02/05/2012 8:27 AM

## 2012-02-05 NOTE — Discharge Summary (Signed)
ELECTROPHYSIOLOGY PROCEDURE DISCHARGE SUMMARY    Patient ID: William Woods,  MRN: [id number], DOB/AGE: 64-12-1945 64 y.o.  Admit date: 02/04/2012 Discharge date: 02/05/2012  Primary Care Physician: Cay Schillings, MD Primary Cardiologist: Dossie Arbour, MD Electrophysiologist: Sherryl Manges, MD  Primary Discharge Diagnosis:  Complete heart block status post pacemaker implantation this admission.   Secondary Discharge Diagnosis:  1.  Long QT syndrome 2.  Hypertension 3.  Thyroid disease 4.  Obesity 5.  Obstructive sleep apnea 6.  Syncope- status post implantable loop recorder April 2013  Procedures This Admission: 1. Explantation of a implantable loop recorder and placement of a permanent pacemaker on 02-04-2012 by Dr William Woods.  The patient received a Medtronic ADDRL1 with model number [id number] right atrial and right ventricular leads.  The patient had no early apparent complications.   Brief HPI: [name] is a 66 year old male with a history of long qt syndrome and syncope dating back to February of this year.  He was evaluated by Dr William Woods at that time with recommendations for loop recorder implantation and genetic testing.  Because of financial reasons, neither were pursued.  He then had recurrent syncope and was evaluated at [facility].  At that time he underwent echocardiogram and catheterization which was normal.  He was discharged from [facility] and then underwent placement of an implantable loop recorder by Dr William Woods on 01-21-2012.  On the day of admission, he was cleaning his car and bent over.  When he stood up, he felt dizzy.  He was checked on by his neighbors and his heart rate was found to be in the 30's and EMS was called.  He was brought to [facility] ER where he was found to be in complete heart block with a ventricular escape of 19bpm.    Hospital Course:  The patient was evaluated by Dr William Woods who recommended pacemaker implantation.  Risks, benefits, and alternatives were reviewed with the  patient who wished to proceed.  He was monitored on telemetry overnight which demonstrated sinus rhythm with ventricular pacing.  His left chest was without hematoma or ecchymosis.  Chest x-ray was obtained which demonstrated no pneumothorax status post device implantation.  He was able to ambulate without difficulty and was considered stable for discharge to home.  Of note, he has been advised not to drive until evaluated by Dr William Woods in 3 months.  Toprol XL 25mg  daily was added by Dr William Woods on discharge with the patient's history of long qt syndrome.  His base rate on his pacer was also set at 80bpm.   Discharge Vitals: Blood pressure 148/79, pulse 83, temperature 97.9 F (36.6 C), temperature source Oral, resp. rate 22, height 6' (1.829 m), weight 292 lb 5.3 oz (132.6 kg), SpO2 96.00%.    Labs:   Lab Results  Component Value Date   WBC 9.6 02/04/2012   HGB 16.0 02/04/2012   HCT 47.0 02/04/2012   MCV 81.6 02/04/2012   PLT 169 02/04/2012    Lab 02/05/12 0513  NA 141  K 3.9  CL 102  CO2 30  BUN 23  CREATININE 1.62*  CALCIUM 8.9  PROT --  BILITOT --  ALKPHOS --  ALT --  AST --  GLUCOSE 99   Lab Results  Component Value Date   TROPONINI <0.30 02/04/2012    Lab Results  Component Value Date   CHOL 161 02/05/2012   Lab Results  Component Value Date   HDL 34* 02/05/2012   Lab Results  Component Value Date   LDLCALC 104* 02/05/2012   Lab Results  Component Value Date   TRIG 115 02/05/2012   Lab Results  Component Value Date   CHOLHDL 4.7 02/05/2012    Discharge Medications:  Medication List  As of 02/05/2012  2:07 PM   STOP taking these medications         magnesium oxide 400 MG tablet         TAKE these medications         anastrozole 1 MG tablet   Commonly known as: ARIMIDEX   Take 0.5 tablets (0.5 mg total) by mouth daily. Please contact the doctor who prescribed this medicine to find out if they want you to continue it, since you had previously stopped your  medicines.      metoprolol succinate 25 MG 24 hr tablet   Commonly known as: TOPROL-XL   Take 1 tablet (25 mg total) by mouth daily.      spironolactone 25 MG tablet   Commonly known as: ALDACTONE   Take 1 tablet (25 mg total) by mouth daily.      thyroid 120 MG tablet   Commonly known as: ARMOUR   Take 1 tablet (120 mg total) by mouth daily.            Disposition:  Discharge Orders    Future Appointments: Provider: Department: Dept Phone: Center:   02/11/2012 4:30 PM [facility] Salvia, MD [facility] [phone] [facility]     Future Orders Please Complete By Expires   Diet - low sodium heart healthy      Increase activity slowly      Comments:   Please see attached sheet for instructions on wound care, activity, and bathing.     Discharge instructions      Comments:   Your Toprol prescription should be available for pickup at [facility] in [place] as it has been e-prescribed.  Please follow up with your primary doctor regarding your chronic medications, including whether you should continue Arimidex.     Follow-up Information    Follow up with [facility]. (Wound check at [facility] office 02/11/12 at 4:30pm)    Contact information:   [address] [place] [address] [phone]      Follow up with Sherryl Manges, MD. (Our office will call you with an appointment. If you have not heard from [place] within 3-5 days, please call for appointment.)    Contact information:   [facility] [phone]         Duration of Discharge Encounter: Greater than 30 minutes including physician time.  Signed, Gypsy Balsam, RN, BSN 02/05/2012, 2:07 PM   I have seen, examined the patient, and reviewed the above assessment and plan.   Co Sign: Hillis Range, MD 02/06/2012 12:25 PM

## 2012-02-05 NOTE — Progress Notes (Signed)
UR Completed. Simmons, Chase Arnall F [phone]  

## 2012-02-08 NOTE — ED Provider Notes (Signed)
I saw and evaluated the patient, reviewed the resident's note and I agree with the findings and plan.  I saw and reviewed the EKG and agree with the findings as documented by the resident  Pt seen emergently upon arrival to the ED, pt bradycardic in 30s- pt awake, alert, answering questions without difficulty.  Lungs CTA, CV rrr but slow.  Pt placed on monitor, IV access obtained, cardiology consulted urgently.  Atropine given in ED without much response, dopamine gtt started- pt taken emergently to cath lab by cards.    CRITICAL CARE Performed by: Ethelda Chick   Total critical care time: 35  Critical care time was exclusive of separately billable procedures and treating other patients.  Critical care was necessary to treat or prevent imminent or life-threatening deterioration.  Critical care was time spent personally by me on the following activities: development of treatment plan with patient and/or surrogate as well as nursing, discussions with consultants, evaluation of patient's response to treatment, examination of patient, obtaining history from patient or surrogate, ordering and performing treatments and interventions, ordering and review of laboratory studies, ordering and review of radiographic studies, pulse oximetry and re-evaluation of patient's condition.  Ethelda Chick, MD 02/08/12 2033

## 2012-02-11 ENCOUNTER — Ambulatory Visit (INDEPENDENT_AMBULATORY_CARE_PROVIDER_SITE_OTHER): Payer: Self-pay | Admitting: *Deleted

## 2012-02-11 DIAGNOSIS — I442 Atrioventricular block, complete: Secondary | ICD-10-CM

## 2012-02-11 LAB — PACEMAKER DEVICE OBSERVATION
AL AMPLITUDE: 4 mv
AL IMPEDENCE PM: 456 Ohm
AL THRESHOLD: 0.75 V
ATRIAL PACING PM: 86
BAMS-0001: 150 {beats}/min
BATTERY VOLTAGE: 2.78 V
RV LEAD IMPEDENCE PM: 597 Ohm
RV LEAD THRESHOLD: 0.625 V
VENTRICULAR PACING PM: 100

## 2012-02-11 NOTE — Progress Notes (Signed)
Wound check-PPM 

## 2012-02-12 ENCOUNTER — Encounter: Payer: Self-pay | Admitting: Internal Medicine

## 2012-02-12 ENCOUNTER — Telehealth: Payer: Self-pay | Admitting: Internal Medicine

## 2012-02-12 NOTE — Telephone Encounter (Signed)
Patient called no answer.LMTC. 

## 2012-02-12 NOTE — Telephone Encounter (Signed)
Pt was seen yesterday by Dr Graciela Husbands and has some concerns with his BP meds. Please call pt to discuss this

## 2012-02-12 NOTE — Telephone Encounter (Signed)
Spoke with pt's wife who reports at last office visit with Dr. Graciela Husbands pt was changed from HCTZ to spironolactone.  Pt felt spironolactone was not helping his blood pressure and that it was actually going up so he stopped spironolactone.  I reviewed hospital discharge note from February 05, 2012 which indicates pt to be on Spironolactone.  I gave this information to wife and she states pt does not want to take spironolactone and would like to go back to HCTZ as he feels blood pressure better controlled on HCTZ.  Recent blood pressures and heart rate are:  4/22-138/99,80                                                                         4/23-153/85,80                                                                         4/24-157/86,61 I told wife I would forward to Dr. Graciela Husbands for his recommendations.

## 2012-02-12 NOTE — Telephone Encounter (Signed)
He can resume HCTZ Thanks  The issue is potassium sparing and he will need BMET in 2-3 weeks steve

## 2012-02-13 ENCOUNTER — Telehealth: Payer: Self-pay | Admitting: Internal Medicine

## 2012-02-13 NOTE — Telephone Encounter (Signed)
Fu call °Pt's wife returning your call °

## 2012-02-13 NOTE — Telephone Encounter (Signed)
I spoke with wife and the blood pressures that were recorded were before pt had taken his medication. Pt usually takes bp when he first gets up. Does not recheck after medication. She will call pcp as script bottle she read to me was not for HCTZ but for Trimamterine.   I will mail order for BMP in 2-3 weeks and fax results to [place]. Mylo Red RN

## 2012-04-30 DIAGNOSIS — M109 Gout, unspecified: Secondary | ICD-10-CM | POA: Insufficient documentation

## 2012-05-12 ENCOUNTER — Ambulatory Visit (INDEPENDENT_AMBULATORY_CARE_PROVIDER_SITE_OTHER): Payer: Self-pay | Admitting: Internal Medicine

## 2012-05-12 ENCOUNTER — Other Ambulatory Visit: Payer: Self-pay

## 2012-05-12 ENCOUNTER — Encounter: Payer: Self-pay | Admitting: Internal Medicine

## 2012-05-12 VITALS — BP 120/88 | HR 103 | Ht 72.0 in | Wt 296.0 lb

## 2012-05-12 DIAGNOSIS — R55 Syncope and collapse: Secondary | ICD-10-CM

## 2012-05-12 DIAGNOSIS — I442 Atrioventricular block, complete: Secondary | ICD-10-CM

## 2012-05-12 DIAGNOSIS — I4581 Long QT syndrome: Secondary | ICD-10-CM

## 2012-05-12 DIAGNOSIS — I10 Essential (primary) hypertension: Secondary | ICD-10-CM

## 2012-05-12 DIAGNOSIS — Z6838 Body mass index (BMI) 38.0-38.9, adult: Secondary | ICD-10-CM | POA: Insufficient documentation

## 2012-05-12 DIAGNOSIS — Z950 Presence of cardiac pacemaker: Secondary | ICD-10-CM

## 2012-05-12 LAB — PACEMAKER DEVICE OBSERVATION
AL AMPLITUDE: 2.8 mv
AL IMPEDENCE PM: 497 Ohm
AL THRESHOLD: 0.875 V
ATRIAL PACING PM: 16
BAMS-0001: 150 {beats}/min
BATTERY VOLTAGE: 2.79 V
RV LEAD AMPLITUDE: 22.4 mv
RV LEAD IMPEDENCE PM: 526 Ohm
RV LEAD THRESHOLD: 0.75 V
VENTRICULAR PACING PM: 100

## 2012-05-12 MED ORDER — METOPROLOL SUCCINATE ER 50 MG PO TB24: 50.0000 mg | ORAL_TABLET | Freq: Every day | ORAL | Status: DC

## 2012-05-12 NOTE — Assessment & Plan Note (Signed)
The patient's device was interrogated and the information was fully reviewed.  The device was reprogrammed to maximize longevity  

## 2012-05-12 NOTE — Assessment & Plan Note (Signed)
No recurrent  

## 2012-05-12 NOTE — Assessment & Plan Note (Signed)
The patient has intermittent complete heart block and has heart block evident today. We will continue him with his current pacing a rhythm of DDD. He has had no recurrent syncope

## 2012-05-12 NOTE — Assessment & Plan Note (Signed)
Blood pressure is reasonably controlled on beta blockers and Triamaterene HYDROCHLOROTHIAZIDE.  He was taking Toprol but when it ran out he didn't realize it was important to refill it.

## 2012-05-12 NOTE — Progress Notes (Signed)
  HPI  William Woods is a 66 y.o. male Seen in followup for pacemaker implanted for symptomatic intermittent complete heart block April 2013. He had a history of syncope with a prolonged QT interval and it undergone loop recorder insertion. With recurrent syncope heart block was identified.  Also be treated with beta blockers for hypertension and long QT. He has some degree of resting tachycardia.  He is not exercising.  Past Medical History  Diagnosis Date  . Long Q-T syndrome   . Hypertension   . Thyroid disease   . Obesity (BMI 30-39.9)   . Obstructive sleep apnea   . Edema     Suspect diastolic heart failure  . Syncope   . CHB (complete heart block)   . Pacemaker     Medtronic Adapta L model ADDRL 1 (serial number [id number]) pacemaker.    Past Surgical History  Procedure Date  . Cardiac catheterization     [facility]   . Pacemaker insertion 02/04/2012    Medtronic Adapta L model ADDRL 1 (serial number [id number]) pacemaker.  . Other surgical history 02/04/2012     removal of a previously implanted MDT Reveal XT implantable loop recorder    Current Outpatient Prescriptions  Medication Sig Dispense Refill  . triamterene-hydrochlorothiazide (DYAZIDE) 37.5-25 MG per capsule Take 1 capsule by mouth every morning.        No Known Allergies  Review of Systems negative except from HPI and PMH  Physical Exam BP 120/88  Pulse 103  Wt 296 lb (134.265 kg) Well developed and well nourished in no acute distress HENT normal E scleral and icterus clear Neck Supple JVP flat; carotids brisk and full Clear to ausculation Device pocket well healed; without hematoma or erythema  \Regular rate and rhythm, no murmurs gallops or rub Soft with active bowel sounds No clubbing cyanosis none Edema Alert and oriented, grossly normal motor and sensory function Skin Warm and Dry  Electrocardiogram demonstrates P. Synchronous pacing  Assessment and  Plan

## 2012-05-12 NOTE — Addendum Note (Signed)
Addended by: Sabino Snipes E on: 05/12/2012 03:34 PM   Modules accepted: Orders

## 2012-05-12 NOTE — Patient Instructions (Addendum)
Your physician wants you to follow-up in: April with Dr Klein You will receive a reminder letter in the mail two months in advance. If you don't receive a letter, please call our office to schedule the follow-up appointment.  

## 2012-05-12 NOTE — Assessment & Plan Note (Signed)
We discussed the importance of exercise. 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

## 2012-05-18 ENCOUNTER — Ambulatory Visit (INDEPENDENT_AMBULATORY_CARE_PROVIDER_SITE_OTHER): Payer: Self-pay | Admitting: Cardiovascular Disease

## 2012-05-18 ENCOUNTER — Encounter: Payer: Self-pay | Admitting: Cardiovascular Disease

## 2012-05-18 VITALS — BP 120/80 | HR 87 | Ht 72.0 in | Wt 291.2 lb

## 2012-05-18 DIAGNOSIS — Z950 Presence of cardiac pacemaker: Secondary | ICD-10-CM

## 2012-05-18 DIAGNOSIS — N390 Urinary tract infection, site not specified: Secondary | ICD-10-CM | POA: Insufficient documentation

## 2012-05-18 DIAGNOSIS — I10 Essential (primary) hypertension: Secondary | ICD-10-CM

## 2012-05-18 DIAGNOSIS — R55 Syncope and collapse: Secondary | ICD-10-CM

## 2012-05-18 DIAGNOSIS — I442 Atrioventricular block, complete: Secondary | ICD-10-CM

## 2012-05-18 MED ORDER — CIPROFLOXACIN HCL 500 MG PO TABS: 500.0000 mg | ORAL_TABLET | Freq: Two times a day (BID) | ORAL | Status: AC

## 2012-05-18 NOTE — Progress Notes (Signed)
HPI  This is a 66 year old gentleman with known history of intermittent complete heart block status post pacemaker placement in April of this year. He went to [facility] primary care today for evaluation of frequent urination and weakness. He felt dizzy there were he was sitting down and had a presyncopal episode. EMS were called to the clinic. His blood sugar was 145. His vital signs were stable. The patient refused to go to the emergency room. He had an EKG done and was told that it was abnormal and they sent him to our clinic for evaluation. He did not have a prescheduled appointment. I'm not sure if they were aware that he had a permanent pacemaker done. The EKG was read as wide QRS rhythm. The patient has been having symptoms of dysuria and frequent urination over the last few days. He has been avoiding drinking fluids to prevent go to the bathroom frequently. He started feeling dizzy and lightheaded over the last few days. He denies any chest pain, dyspnea or palpitations.   No Known Allergies   Current Outpatient Prescriptions on File Prior to Visit  Medication Sig Dispense Refill  . metoprolol succinate (TOPROL-XL) 50 MG 24 hr tablet Take 1 tablet (50 mg total) by mouth daily. Take with or immediately following a meal.  90 tablet  4  . triamterene-hydrochlorothiazide (DYAZIDE) 37.5-25 MG per capsule Take 1 capsule by mouth every morning.         Past Medical History  Diagnosis Date  . Long Q-T syndrome   . Hypertension   . Thyroid disease   . Obesity (BMI 30-39.9)   . Obstructive sleep apnea   . Edema     Suspect diastolic heart failure  . Syncope   . CHB (complete heart block)   . Pacemaker     Medtronic Adapta L model ADDRL 1 (serial number [id number] [id number]) pacemaker.     Past Surgical History  Procedure Date  . Pacemaker insertion 02/04/2012    Medtronic Adapta L model ADDRL 1 (serial number [id number]) pacemaker.  . Other surgical history 02/04/2012     removal of a  previously implanted MDT Reveal XT implantable loop recorder  . Cardiac catheterization     [facility]   . Cardiac catheterization 01/2012    [facility]: no significant CAD, EF 40%.      Family History  Problem Relation Age of Onset  . Kidney disease Father     long QT disease  . Cancer Mother      History   Social History  . Marital Status: Married    Spouse Name: N/A    Number of Children: N/A  . Years of Education: N/A   Occupational History  . Not on file.   Social History Main Topics  . Smoking status: Former Smoker -- 1.0 packs/day for 28 years    Types: Cigarettes  . Smokeless tobacco: Former Neurosurgeon    Quit date: 12/11/1997  . Alcohol Use: No  . Drug Use: No  . Sexually Active: Not on file   Other Topics Concern  . 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 He lives with his wife. Pt is not active..the patient father had a history of long QT and syncope and died at age 60. His mother died at age 75 of cancer.      PHYSICAL EXAM   BP 120/80  Pulse 87  Ht 6' (1.829  m)  Wt 291 lb 4 oz (132.11 kg)  BMI 39.50 kg/m2 Constitutional: He is oriented to person, place, and time. He appears well-developed and well-nourished. No distress.  HENT: No nasal discharge.  Head: Normocephalic and atraumatic.  Eyes: Pupils are equal and round. Right eye exhibits no discharge. Left eye exhibits no discharge.  Neck: Normal range of motion. Neck supple. No JVD present. No thyromegaly present.  Cardiovascular: Normal rate, regular rhythm, normal heart sounds and. Exam reveals no gallop and no friction rub. No murmur heard.  Pulmonary/Chest: Effort normal and breath sounds normal. No stridor. No respiratory distress. He has no wheezes. He has no rales. He exhibits no tenderness.  Abdominal: Soft. Bowel sounds are normal. He exhibits no distension. There is no tenderness. There is no rebound and no guarding.  Musculoskeletal: Normal  range of motion. He exhibits no edema and no tenderness.  Neurological: He is alert and oriented to person, place, and time. Coordination normal.  Skin: Skin is warm and dry. No rash noted. He is not diaphoretic. No erythema. No pallor.  Psychiatric: He has a normal mood and affect. His behavior is normal. Judgment and thought content normal.       EKG: Sinus rhythm with ventricular paced rhythm. The same was noted on his ECG from [facility] primary care    ASSESSMENT AND PLAN

## 2012-05-18 NOTE — Assessment & Plan Note (Signed)
His pacemaker seems to be functioning normally. I do not see evidence of cardiac arrhythmia.

## 2012-05-18 NOTE — Assessment & Plan Note (Signed)
Patient has clinical symptoms suggestive of urinary tract infection. He also seems to be volume depleted. We did labs today including CBC and basic metabolic profile. After the blood draw, he had another presyncopal episode. I advised him and his wife to go to the emergency room for evaluation. However, the patient again refused. I sample was obtained for urinalysis. I advised him to significantly increase fluid intake. I will start him on ciprofloxacin 500 mg twice daily. We will call him tomorrow with the results of the bloodwork. He is to followup with his primary care physician as well.

## 2012-05-18 NOTE — Patient Instructions (Addendum)
Labs today.  Start Ciprofloxacin 500 mg twice daily for 7 days.  Follow up with your primary care physician in 2 weeks to make sure the infection is clearing.

## 2012-05-19 LAB — CBC WITH DIFFERENTIAL/PLATELET
Basophils Absolute: 0 10*3/uL (ref 0.0–0.2)
Basos: 0 % (ref 0–3)
Eos: 1 % (ref 0–7)
Eosinophils Absolute: 0.1 10*3/uL (ref 0.0–0.4)
HCT: 41.2 % (ref 37.5–51.0)
Hemoglobin: 13.7 g/dL (ref 12.6–17.7)
Immature Grans (Abs): 0 10*3/uL (ref 0.0–0.1)
Immature Granulocytes: 0 % (ref 0–2)
Lymphocytes Absolute: 0.9 10*3/uL (ref 0.7–4.5)
Lymphs: 10 % — ABNORMAL LOW (ref 14–46)
MCH: 26.6 pg (ref 26.6–33.0)
MCHC: 33.3 g/dL (ref 31.5–35.7)
MCV: 80 fL (ref 79–97)
Monocytes Absolute: 0.8 10*3/uL (ref 0.1–1.0)
Monocytes: 8 % (ref 4–13)
Neutrophils Absolute: 7.8 10*3/uL (ref 1.8–7.8)
Neutrophils Relative %: 81 % — ABNORMAL HIGH (ref 40–74)
RBC: 5.16 x10E6/uL (ref 4.14–5.80)
RDW: 13.8 % (ref 12.3–15.4)
WBC: 9.6 10*3/uL (ref 4.0–10.5)

## 2012-05-19 LAB — BASIC METABOLIC PANEL
BUN/Creatinine Ratio: 17 (ref 10–22)
BUN: 23 mg/dL (ref 8–27)
CO2: 22 mmol/L (ref 19–28)
Calcium: 8.6 mg/dL (ref 8.6–10.2)
Chloride: 92 mmol/L — ABNORMAL LOW (ref 97–108)
Creatinine, Ser: 1.36 mg/dL — ABNORMAL HIGH (ref 0.76–1.27)
GFR calc Af Amer: 63 mL/min/{1.73_m2} (ref >59)
GFR calc non Af Amer: 55 mL/min/{1.73_m2} — ABNORMAL LOW (ref >59)
Glucose: 114 mg/dL — ABNORMAL HIGH (ref 65–99)
Potassium: 3.8 mmol/L (ref 3.5–5.2)
Sodium: 130 mmol/L — ABNORMAL LOW (ref 134–144)

## 2012-05-19 LAB — URINALYSIS, ROUTINE W REFLEX MICROSCOPIC
Bilirubin, UA: NEGATIVE
Glucose, UA: NEGATIVE
Ketones, UA: NEGATIVE
Nitrite, UA: NEGATIVE
Specific Gravity, UA: 1.015 (ref 1.005–1.030)
Urobilinogen, Ur: 1 mg/dL (ref 0.0–1.9)
pH, UA: 6 (ref 5.0–7.5)

## 2012-05-19 LAB — MICROSCOPIC EXAMINATION

## 2012-08-03 ENCOUNTER — Encounter: Payer: Self-pay | Admitting: Internal Medicine

## 2012-08-03 ENCOUNTER — Ambulatory Visit (INDEPENDENT_AMBULATORY_CARE_PROVIDER_SITE_OTHER): Payer: Self-pay | Admitting: Internal Medicine

## 2012-08-03 VITALS — BP 140/98 | HR 99 | Temp 98.4°F | Ht 72.0 in | Wt 296.0 lb

## 2012-08-03 DIAGNOSIS — N390 Urinary tract infection, site not specified: Secondary | ICD-10-CM

## 2012-08-03 DIAGNOSIS — E785 Hyperlipidemia, unspecified: Secondary | ICD-10-CM

## 2012-08-03 DIAGNOSIS — I4581 Long QT syndrome: Secondary | ICD-10-CM

## 2012-08-03 DIAGNOSIS — R3911 Hesitancy of micturition: Secondary | ICD-10-CM

## 2012-08-03 DIAGNOSIS — I10 Essential (primary) hypertension: Secondary | ICD-10-CM

## 2012-08-03 LAB — HM COLONOSCOPY

## 2012-08-03 MED ORDER — TRIAMTERENE-HCTZ 37.5-25 MG PO CAPS: 1.0000 | ORAL_CAPSULE | ORAL | Status: DC

## 2012-08-03 MED ORDER — CIPROFLOXACIN HCL 250 MG PO TABS: 250.0000 mg | ORAL_TABLET | Freq: Two times a day (BID) | ORAL | Status: DC

## 2012-08-03 MED ORDER — METOPROLOL SUCCINATE ER 50 MG PO TB24: 50.0000 mg | ORAL_TABLET | Freq: Every day | ORAL | Status: DC

## 2012-08-03 NOTE — Assessment & Plan Note (Signed)
Symptomatically doing well after pacemaker placement. Will check electrolytes and renal function with labs today. Follow up 4 weeks.

## 2012-08-03 NOTE — Progress Notes (Signed)
Subjective:    Patient ID: William Woods, male    DOB: [birth year], 66 y.o.   MRN: [id number]  HPI 66 year old male with history of hypertension, long QT syndrome status post pacemaker placement, obesity presents to establish care. He reports that he is generally been doing well. He has not had any issues of palpitations, chest pain. He did have a recent issue in July of 2013 in which he had dysuria and dehydration. He was treated for possible urinary tract infection with 4 days of antibiotics. He reports that symptoms improved but did not completely resolve. Since that time, he has continued to have some urinary hesitancy with decreased urinary stream, but no fever, chills, flank pain.  Outpatient Encounter Prescriptions as of 08/03/2012  Medication Sig Dispense Refill  . metoprolol succinate (TOPROL-XL) 50 MG 24 hr tablet Take 1 tablet (50 mg total) by mouth daily. Take with or immediately following a meal.  90 tablet  4  . triamterene-hydrochlorothiazide (DYAZIDE) 37.5-25 MG per capsule Take 1 each (1 capsule total) by mouth every morning.  90 capsule  4  . DISCONTD: metoprolol succinate (TOPROL-XL) 50 MG 24 hr tablet Take 1 tablet (50 mg total) by mouth daily. Take with or immediately following a meal.  90 tablet  4  . DISCONTD: triamterene-hydrochlorothiazide (DYAZIDE) 37.5-25 MG per capsule Take 1 capsule by mouth every morning.      . ciprofloxacin (CIPRO) 250 MG tablet Take 1 tablet (250 mg total) by mouth 2 (two) times daily.  42 tablet  0  BP 140/98  Pulse 99  Temp 98.4 F (36.9 C) (Oral)  Ht 6' (1.829 m)  Wt 296 lb (134.265 kg)  BMI 40.14 kg/m2  SpO2 97%   Review of Systems  Constitutional: Negative for fever, chills, activity change, appetite change, fatigue and unexpected weight change.  Eyes: Negative for visual disturbance.  Respiratory: Negative for cough and shortness of breath.   Cardiovascular: Negative for chest pain, palpitations and leg swelling.    Gastrointestinal: Negative for abdominal pain and abdominal distention.  Genitourinary: Positive for decreased urine volume and difficulty urinating. Negative for dysuria, urgency, frequency and hematuria.  Musculoskeletal: Negative for arthralgias and gait problem.  Skin: Negative for color change and rash.  Hematological: Negative for adenopathy.  Psychiatric/Behavioral: Negative for disturbed wake/sleep cycle and dysphoric mood. The patient is not nervous/anxious.        Objective:   Physical Exam  Constitutional: He is oriented to person, place, and time. He appears well-developed and well-nourished. No distress.  HENT:  Head: Normocephalic and atraumatic.  Right Ear: External ear normal.  Left Ear: External ear normal.  Nose: Nose normal.  Mouth/Throat: Oropharynx is clear and moist. No oropharyngeal exudate.  Eyes: Conjunctivae normal and EOM are normal. Pupils are equal, round, and reactive to light. Right eye exhibits no discharge. Left eye exhibits no discharge. No scleral icterus.  Neck: Normal range of motion. Neck supple. No tracheal deviation present. No thyromegaly present.  Cardiovascular: Normal rate, regular rhythm and normal heart sounds.  Exam reveals no gallop and no friction rub.   No murmur heard. Pulmonary/Chest: Effort normal and breath sounds normal. No respiratory distress. He has no wheezes. He has no rales. He exhibits no tenderness.  Musculoskeletal: Normal range of motion. He exhibits no edema.  Lymphadenopathy:    He has no cervical adenopathy.  Neurological: He is alert and oriented to person, place, and time. No cranial nerve deficit. Coordination normal.  Skin: Skin is warm and  dry. No rash noted. He is not diaphoretic. No erythema. No pallor.  Psychiatric: He has a normal mood and affect. His behavior is normal. Judgment and thought content normal.          Assessment & Plan:

## 2012-08-03 NOTE — Assessment & Plan Note (Signed)
BMI 40. Pt reports sedentary lifestyle. Will check CMP, TSH with labs today. Followup 4 weeks.

## 2012-08-03 NOTE — Assessment & Plan Note (Signed)
Blood pressure has been fairly well-controlled per patient report. Will check renal function with labs today. Follow up 4 weeks.

## 2012-08-03 NOTE — Assessment & Plan Note (Signed)
Suspect secondary to prostatitis. Will check PSA with labs. Will treat underlying urinary tract infection. Followup 4 weeks.

## 2012-08-03 NOTE — Assessment & Plan Note (Signed)
Symptoms are consistent with urinary tract infection. Will check urinalysis and urine culture today. Will start Cipro 250 mg twice daily x3 weeks given likelihood of prostatitis. Follow up 4 weeks.

## 2012-08-04 LAB — COMPREHENSIVE METABOLIC PANEL
ALT: 15 U/L (ref 0–53)
AST: 19 U/L (ref 0–37)
Albumin: 3.4 g/dL — ABNORMAL LOW (ref 3.5–5.2)
Alkaline Phosphatase: 65 U/L (ref 39–117)
BUN: 23 mg/dL (ref 6–23)
CO2: 30 mEq/L (ref 19–32)
Calcium: 9.3 mg/dL (ref 8.4–10.5)
Chloride: 101 mEq/L (ref 96–112)
Creatinine, Ser: 1.4 mg/dL (ref 0.4–1.5)
GFR: 52.41 mL/min — ABNORMAL LOW (ref >60.00)
Glucose, Bld: 129 mg/dL — ABNORMAL HIGH (ref 70–99)
Potassium: 4.1 mEq/L (ref 3.5–5.1)
Sodium: 139 mEq/L (ref 135–145)
Total Bilirubin: 0.6 mg/dL (ref 0.3–1.2)
Total Protein: 7.3 g/dL (ref 6.0–8.3)

## 2012-08-04 LAB — PSA: PSA: 1.6 ng/mL (ref 0.10–4.00)

## 2012-08-04 LAB — LIPID PANEL
Cholesterol: 187 mg/dL (ref 0–200)
HDL: 32.6 mg/dL — ABNORMAL LOW (ref >39.00)
Total CHOL/HDL Ratio: 6
Triglycerides: 254 mg/dL — ABNORMAL HIGH (ref 0.0–149.0)
VLDL: 50.8 mg/dL — ABNORMAL HIGH (ref 0.0–40.0)

## 2012-08-04 LAB — LDL CHOLESTEROL, DIRECT: Direct LDL: 125.1 mg/dL

## 2012-08-05 LAB — CULTURE, URINE COMPREHENSIVE
Colony Count: NO GROWTH
Organism ID, Bacteria: NO GROWTH

## 2012-08-06 ENCOUNTER — Encounter: Payer: Self-pay | Admitting: Internal Medicine

## 2012-11-24 ENCOUNTER — Emergency Department: Payer: Self-pay | Admitting: Emergency Medicine

## 2012-12-05 ENCOUNTER — Other Ambulatory Visit: Payer: Self-pay

## 2013-05-12 ENCOUNTER — Encounter: Payer: Self-pay | Admitting: *Deleted

## 2013-05-26 ENCOUNTER — Other Ambulatory Visit: Payer: Self-pay

## 2013-07-22 ENCOUNTER — Ambulatory Visit (INDEPENDENT_AMBULATORY_CARE_PROVIDER_SITE_OTHER): Payer: Medicare Other | Admitting: Internal Medicine

## 2013-07-22 DIAGNOSIS — I10 Essential (primary) hypertension: Secondary | ICD-10-CM

## 2013-07-22 DIAGNOSIS — I442 Atrioventricular block, complete: Secondary | ICD-10-CM

## 2013-07-22 DIAGNOSIS — R55 Syncope and collapse: Secondary | ICD-10-CM

## 2013-07-22 DIAGNOSIS — G4730 Sleep apnea, unspecified: Secondary | ICD-10-CM

## 2013-07-22 DIAGNOSIS — Z950 Presence of cardiac pacemaker: Secondary | ICD-10-CM | POA: Diagnosis not present

## 2013-07-22 DIAGNOSIS — I4581 Long QT syndrome: Secondary | ICD-10-CM

## 2013-07-22 DIAGNOSIS — G478 Other sleep disorders: Secondary | ICD-10-CM

## 2013-07-22 LAB — PACEMAKER DEVICE OBSERVATION
AL AMPLITUDE: 4 mv
AL IMPEDENCE PM: 413 Ohm
AL THRESHOLD: 0.75 V
ATRIAL PACING PM: 17
BAMS-0001: 150 {beats}/min
BATTERY VOLTAGE: 2.78 V
RV LEAD AMPLITUDE: 15.67 mv
RV LEAD IMPEDENCE PM: 569 Ohm
RV LEAD THRESHOLD: 1 V
VENTRICULAR PACING PM: 100

## 2013-07-22 MED ORDER — PROPRANOLOL HCL ER 160 MG PO CP24: ORAL_CAPSULE | ORAL | Status: DC

## 2013-07-22 MED ORDER — SPIRONOLACTONE 25 MG PO TABS: ORAL_TABLET | ORAL | Status: DC

## 2013-07-22 MED ORDER — NADOLOL 40 MG PO TABS: 40.0000 mg | ORAL_TABLET | Freq: Every day | ORAL | Status: DC

## 2013-07-22 NOTE — Assessment & Plan Note (Signed)
No recurrent syncope 

## 2013-07-22 NOTE — Assessment & Plan Note (Signed)
Likely real. Again there is no inclination to pursue genetic testing.  We'll change her from cardioselective beta blocker to a nonselective beta blocker and use it for blood pressure control. We'll try Corgard 40 and Inderal LA 160 he will let us know which she tolerates better and we reviewed side effects

## 2013-07-22 NOTE — Progress Notes (Signed)
Patient Care Team: Wynona Dove, MD as PCP - General (Internal Medicine)   HPI  William Woods is a 67 y.o. male Seen in followup for pacemaker implanted for symptomatic intermittent complete heart block April 2013. He had a history of syncope with a prolonged QT interval and it undergone loop recorder insertion. With recurrent syncope heart block was identified.  Also be treated with beta blockers for hypertension and long QT.  The family carries a diagnosis of long QT syndrome. Financial issues that precluded genetic testing. Both of his sons are deceased but unassociated with sudden death. In the past he has been advised of the  websites QT drugs.org and his wife place close attention to it. He is a potassium sparing thiazide-containing diuretic. He had problems with this contributing to gallop.  Past Medical History  Diagnosis Date  . Long Q-T syndrome   . Hypertension   . Thyroid disease   . Obesity (BMI 30-39.9)   . Obstructive sleep apnea   . Edema     Suspect diastolic heart failure  . Syncope   . CHB (complete heart block)   . Pacemaker     Medtronic Adapta L model ADDRL 1 (serial number [id number]) pacemaker.    Past Surgical History  Procedure Laterality Date  . Pacemaker insertion  02/04/2012    Medtronic Adapta L model ADDRL 1 (serial number [id number]) pacemaker.  . Other surgical history  02/04/2012     removal of a previously implanted MDT Reveal XT implantable loop recorder  . Cardiac catheterization      [facility]   . Cardiac catheterization  01/2012    [facility]: no significant CAD, EF 40%.     Current Outpatient Prescriptions  Medication Sig Dispense Refill  . ciprofloxacin (CIPRO) 250 MG tablet Take 1 tablet (250 mg total) by mouth 2 (two) times daily.  42 tablet  0  . metoprolol succinate (TOPROL-XL) 50 MG 24 hr tablet Take 1 tablet (50 mg total) by mouth daily. Take with or immediately following a meal.  90 tablet  4  .  triamterene-hydrochlorothiazide (DYAZIDE) 37.5-25 MG per capsule Take 1 each (1 capsule total) by mouth every morning.  90 capsule  4   No current facility-administered medications for this visit.    No Known Allergies  Review of Systems negative except from HPI and PMH  Physical Exam   Well developed and  Morbidly obese in no acute distress HENT normal Neck supple with JVP-flat Clear Regular rate and rhythm, no murmurs or gallops Abd-soft with active BS No Clubbing cyanosis 1-2+ edema Skin-warm and dry A & Oriented  Grossly normal sensory and motor function     Assessment and  Plan

## 2013-07-22 NOTE — Assessment & Plan Note (Signed)
The patient's device was interrogated.  The information was reviewed. No changes were made in the programming.    

## 2013-07-22 NOTE — Assessment & Plan Note (Signed)
Intermittent. 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

## 2013-07-22 NOTE — Patient Instructions (Addendum)
Your physician has recommended you make the following change in your medication:  1) Stop metoprolol 2) Stop triamterene HCTZ 3) Start aldactone 25 mg one tablet daily until swelling has resolved, then take one as needed for swelling.  ** you are being given prescriptions for 2 different beta blockers to try. You may take them in any order, but DO NOT take both at the same time. 4) inderal LA 160 mg one tablet by mouth daily 5) nadolol 40 mg one tablet by mouth daily  Your physician recommends that you have lab work today: bmp/magnesium  Your physician recommends that you return for lab work in: 1 week (bmp) & 3 weeks (bmp)  Remote monitoring is used to monitor your Pacemaker of ICD from home. This monitoring reduces the number of office visits required to check your device to one time per year. It allows [place] to keep an eye on the functioning of your device to ensure it is working properly. You are scheduled for a device check from home on 10/25/13. You may send your transmission at any time that day. If you have a wireless device, the transmission will be sent automatically. After your physician reviews your transmission, you will receive a postcard with your next transmission date.  Your physician wants you to follow-up in: 6 months with Dr. Graciela Husbands. You will receive a reminder letter in the mail two months in advance. If you don't receive a letter, please call our office to schedule the follow-up appointment.

## 2013-07-22 NOTE — Assessment & Plan Note (Signed)
We'll change his beta blocker as noted above and stop his potassium sparing diuretic. We'll give him Aldactone to take. We will follow his potassium checked in today in 1 weeks and in 3 weeks

## 2013-07-23 LAB — BASIC METABOLIC PANEL
BUN/Creatinine Ratio: 17 (ref 10–22)
BUN: 22 mg/dL (ref 8–27)
CO2: 27 mmol/L (ref 18–29)
Calcium: 9.6 mg/dL (ref 8.6–10.2)
Chloride: 95 mmol/L — ABNORMAL LOW (ref 97–108)
Creatinine, Ser: 1.31 mg/dL — ABNORMAL HIGH (ref 0.76–1.27)
GFR calc Af Amer: 66 mL/min/{1.73_m2} (ref >59)
GFR calc non Af Amer: 57 mL/min/{1.73_m2} — ABNORMAL LOW (ref >59)
Glucose: 82 mg/dL (ref 65–99)
Potassium: 4.3 mmol/L (ref 3.5–5.2)
Sodium: 140 mmol/L (ref 134–144)

## 2013-07-23 LAB — MAGNESIUM: Magnesium: 2 mg/dL (ref 1.6–2.6)

## 2013-07-28 ENCOUNTER — Ambulatory Visit (INDEPENDENT_AMBULATORY_CARE_PROVIDER_SITE_OTHER): Payer: Medicare Other | Admitting: *Deleted

## 2013-07-28 DIAGNOSIS — I4581 Long QT syndrome: Secondary | ICD-10-CM | POA: Diagnosis not present

## 2013-07-28 DIAGNOSIS — I442 Atrioventricular block, complete: Secondary | ICD-10-CM | POA: Diagnosis not present

## 2013-07-29 LAB — BASIC METABOLIC PANEL
BUN/Creatinine Ratio: 13 (ref 10–22)
BUN: 14 mg/dL (ref 8–27)
CO2: 27 mmol/L (ref 18–29)
Calcium: 8.8 mg/dL (ref 8.6–10.2)
Chloride: 102 mmol/L (ref 97–108)
Creatinine, Ser: 1.06 mg/dL (ref 0.76–1.27)
GFR calc Af Amer: 85 mL/min/{1.73_m2} (ref >59)
GFR calc non Af Amer: 73 mL/min/{1.73_m2} (ref >59)
Glucose: 100 mg/dL — ABNORMAL HIGH (ref 65–99)
Potassium: 4.3 mmol/L (ref 3.5–5.2)
Sodium: 141 mmol/L (ref 134–144)

## 2013-08-11 ENCOUNTER — Ambulatory Visit (INDEPENDENT_AMBULATORY_CARE_PROVIDER_SITE_OTHER): Payer: Medicare Other

## 2013-08-11 ENCOUNTER — Telehealth: Payer: Self-pay

## 2013-08-11 DIAGNOSIS — I442 Atrioventricular block, complete: Secondary | ICD-10-CM | POA: Diagnosis not present

## 2013-08-11 DIAGNOSIS — I4581 Long QT syndrome: Secondary | ICD-10-CM

## 2013-08-11 NOTE — Telephone Encounter (Signed)
Pt presented to office today with concerns about his BP. States that BP has consistently been creeping up, worse in am.   156/100, 134/68, 148/78, 169/75, 140/93 on his new med regimen of inderal and nadolol. Pt currently taking nadolol 40 mg daily.  He is concerned that this dose is too low. Pt would like to know if he can take nadolol bid or if there is a 24 hr extended release version. Please advise.  Thank you.

## 2013-08-12 LAB — BASIC METABOLIC PANEL
BUN/Creatinine Ratio: 12 (ref 10–22)
BUN: 13 mg/dL (ref 8–27)
CO2: 28 mmol/L (ref 18–29)
Calcium: 9 mg/dL (ref 8.6–10.2)
Chloride: 103 mmol/L (ref 97–108)
Creatinine, Ser: 1.07 mg/dL (ref 0.76–1.27)
GFR calc Af Amer: 84 mL/min/{1.73_m2} (ref >59)
GFR calc non Af Amer: 72 mL/min/{1.73_m2} (ref >59)
Glucose: 100 mg/dL — ABNORMAL HIGH (ref 65–99)
Potassium: 4.8 mmol/L (ref 3.5–5.2)
Sodium: 144 mmol/L (ref 134–144)

## 2013-08-20 ENCOUNTER — Telehealth: Payer: Self-pay

## 2013-08-20 NOTE — Telephone Encounter (Signed)
Spoke w/ pt.  Pt called requesting to speak w/ William Woods, as she left him a message regarding his labs.   Informed pt of results.  He states that his "labs might be normal, but my drugs aren't". Pt reports that BP has consistently been up, 160s-100s, he "feels like crap". States that his meds "are not working, what other drugs does Dr. Graciela Husbands want me to take?". Pt is very upset and confrontational.  He would like a call today regarding his meds.  Please advise.  Thank you.

## 2013-08-20 NOTE — Telephone Encounter (Signed)
Patient tells me he tried the first beta blocker (Inderal) Graciela Husbands recommended trying and that one made him feel bad. He is currently taking Nadolol 40mg  daily. He really wants Dr.Klein to consider something different with his BP still high. He states he currently runs 145-150/95-100. I explained I would discuss this with Dr. Graciela Husbands next week and get back with him. Patient verbalized understanding and agreeable to plan.

## 2013-08-22 NOTE — Telephone Encounter (Signed)
No extended version but can take BID where is he with sleep study? Thanks

## 2013-08-23 ENCOUNTER — Encounter: Payer: Self-pay | Admitting: *Deleted

## 2013-08-23 ENCOUNTER — Other Ambulatory Visit: Payer: Self-pay | Admitting: *Deleted

## 2013-08-25 ENCOUNTER — Other Ambulatory Visit: Payer: Self-pay | Admitting: *Deleted

## 2013-08-25 NOTE — Telephone Encounter (Signed)
Advised patient to increase his Nadolol to 80 mg daily per Dr. Graciela Husbands  - patient tells me he is currently taking 40 mg BID at home and his BPs are still elevated. I will discuss this with Graciela Husbands and get clarification of plan of care. Patient verbalized understanding and agreeable to plan

## 2013-08-26 ENCOUNTER — Other Ambulatory Visit: Payer: Self-pay

## 2013-08-26 ENCOUNTER — Other Ambulatory Visit: Payer: Self-pay | Admitting: *Deleted

## 2013-08-27 ENCOUNTER — Other Ambulatory Visit: Payer: Self-pay | Admitting: *Deleted

## 2013-08-27 DIAGNOSIS — Z950 Presence of cardiac pacemaker: Secondary | ICD-10-CM

## 2013-08-27 DIAGNOSIS — I10 Essential (primary) hypertension: Secondary | ICD-10-CM

## 2013-08-27 DIAGNOSIS — I442 Atrioventricular block, complete: Secondary | ICD-10-CM

## 2013-08-27 DIAGNOSIS — I4581 Long QT syndrome: Secondary | ICD-10-CM

## 2013-08-27 DIAGNOSIS — R55 Syncope and collapse: Secondary | ICD-10-CM

## 2013-08-27 DIAGNOSIS — E875 Hyperkalemia: Secondary | ICD-10-CM

## 2013-08-27 MED ORDER — LOSARTAN POTASSIUM 50 MG PO TABS: 50.0000 mg | ORAL_TABLET | Freq: Every day | ORAL | Status: DC

## 2013-08-27 MED ORDER — NADOLOL 40 MG PO TABS: 40.0000 mg | ORAL_TABLET | Freq: Every day | ORAL | Status: DC

## 2013-08-27 NOTE — Telephone Encounter (Signed)
Sending prescription in for Losartan 50mg . Also sending refill for nadalol because patient is out of the medication. Advised them to come in for BMET 11/19 to check potassium level after initiation of medication. Patient verbalized understanding and agreeable to plan.

## 2013-09-03 DIAGNOSIS — M999 Biomechanical lesion, unspecified: Secondary | ICD-10-CM | POA: Diagnosis not present

## 2013-09-03 DIAGNOSIS — IMO0001 Reserved for inherently not codable concepts without codable children: Secondary | ICD-10-CM | POA: Diagnosis not present

## 2013-09-03 DIAGNOSIS — M5137 Other intervertebral disc degeneration, lumbosacral region: Secondary | ICD-10-CM | POA: Diagnosis not present

## 2013-09-07 DIAGNOSIS — M999 Biomechanical lesion, unspecified: Secondary | ICD-10-CM | POA: Diagnosis not present

## 2013-09-07 DIAGNOSIS — IMO0001 Reserved for inherently not codable concepts without codable children: Secondary | ICD-10-CM | POA: Diagnosis not present

## 2013-09-07 DIAGNOSIS — M5137 Other intervertebral disc degeneration, lumbosacral region: Secondary | ICD-10-CM | POA: Diagnosis not present

## 2013-09-08 ENCOUNTER — Telehealth: Payer: Self-pay

## 2013-09-08 ENCOUNTER — Ambulatory Visit (INDEPENDENT_AMBULATORY_CARE_PROVIDER_SITE_OTHER): Payer: Medicare Other

## 2013-09-08 DIAGNOSIS — E875 Hyperkalemia: Secondary | ICD-10-CM | POA: Diagnosis not present

## 2013-09-08 NOTE — Telephone Encounter (Signed)
Pt presented to office today for lab draw and wanted to speak about his bp meds. Reports that BP is good in the pm when he gets home from work and sits for about 30-45 mins (135-140/85-90). His concern is with his am readings which tend to be higher around 155-160/85-90. Pt reports that HCTZ worked really well for his BP previously, but worsened his gout.   He would like something that works just as well but doesn't aggravate his gout. He is really interested in something extended release to keep his am readings lower. Pt reports that he does not want a sleep study, as he "has a sleep number bed". Had lengthy discussion with pt about benefits of sleep study and treatments. He would like for Dr. Graciela Husbands to go ahead and order this for him if he will.  Pt did not want a follow up appt to discuss this, but would appreciate a phone call with date for sleep study and possible new meds. Please advise.  Thank you!

## 2013-09-09 LAB — BASIC METABOLIC PANEL
BUN/Creatinine Ratio: 14 (ref 10–22)
BUN: 15 mg/dL (ref 8–27)
CO2: 25 mmol/L (ref 18–29)
Calcium: 9 mg/dL (ref 8.6–10.2)
Chloride: 99 mmol/L (ref 97–108)
Creatinine, Ser: 1.09 mg/dL (ref 0.76–1.27)
GFR calc Af Amer: 82 mL/min/{1.73_m2} (ref >59)
GFR calc non Af Amer: 71 mL/min/{1.73_m2} (ref >59)
Glucose: 70 mg/dL (ref 65–99)
Potassium: 4.6 mmol/L (ref 3.5–5.2)
Sodium: 140 mmol/L (ref 134–144)

## 2013-09-10 NOTE — Telephone Encounter (Signed)
Left message on pt's voicemail that someone from [facility] would be contacting him for a home sleep study. Asked pt to call at his convenience.

## 2013-09-10 NOTE — Telephone Encounter (Signed)
Cant figure out what meds he is on, 2 B B are listed.  I would have suggest we try taking either cozaar or bb a second time at night And see how he does plz schedule sleep study, either home or lab whichever is easier for pt

## 2013-09-23 DIAGNOSIS — H01009 Unspecified blepharitis unspecified eye, unspecified eyelid: Secondary | ICD-10-CM | POA: Diagnosis not present

## 2013-09-25 DIAGNOSIS — G4733 Obstructive sleep apnea (adult) (pediatric): Secondary | ICD-10-CM | POA: Diagnosis not present

## 2013-09-27 ENCOUNTER — Encounter: Payer: Self-pay | Admitting: Internal Medicine

## 2013-10-01 ENCOUNTER — Other Ambulatory Visit: Payer: Self-pay | Admitting: *Deleted

## 2013-10-01 DIAGNOSIS — G4733 Obstructive sleep apnea (adult) (pediatric): Secondary | ICD-10-CM

## 2013-10-04 ENCOUNTER — Telehealth: Payer: Self-pay | Admitting: *Deleted

## 2013-10-04 NOTE — Telephone Encounter (Signed)
Informed pt of sleep study referral to pulmonology, secondary to severe OSA results. Pt agreeable to plan.

## 2013-10-22 ENCOUNTER — Telehealth: Payer: Self-pay | Admitting: *Deleted

## 2013-10-22 ENCOUNTER — Other Ambulatory Visit: Payer: Self-pay | Admitting: *Deleted

## 2013-10-22 MED ORDER — LOSARTAN POTASSIUM-HCTZ 100-12.5 MG PO TABS: ORAL_TABLET | ORAL | Status: DC

## 2013-10-22 NOTE — Telephone Encounter (Signed)
Pt faxed over questions about medications/BP. Dr. Caryl Comes spoke with pt's wife. Order to stop Losartan, change to Hyzaar 100/12.5 - take half tablet daily. Pt and wife agreeable to plan.

## 2013-10-25 ENCOUNTER — Ambulatory Visit (INDEPENDENT_AMBULATORY_CARE_PROVIDER_SITE_OTHER): Payer: Medicare Other | Admitting: *Deleted

## 2013-10-25 DIAGNOSIS — I442 Atrioventricular block, complete: Secondary | ICD-10-CM

## 2013-10-26 ENCOUNTER — Telehealth: Payer: Self-pay | Admitting: Internal Medicine

## 2013-10-26 NOTE — Telephone Encounter (Signed)
Advised pt he was to stop Losartan, start Hyzaar 1/2 tablet daily (Pt thought he was to take it every other day). Pt verbalized understanding and agreeable to plan.

## 2013-10-26 NOTE — Telephone Encounter (Signed)
New Problem:  Pt would like clarification on what meds he is to take.

## 2013-10-27 LAB — MDC_IDC_ENUM_SESS_TYPE_REMOTE
Battery Impedance: 160 Ohm
Battery Remaining Longevity: 127 mo
Battery Voltage: 2.78 V
Brady Statistic AP VP Percent: 83 %
Brady Statistic AP VS Percent: 2 %
Brady Statistic AS VP Percent: 14 %
Brady Statistic AS VS Percent: 1 %
Date Time Interrogation Session: 20150105123345
Lead Channel Impedance Value: 392 Ohm
Lead Channel Impedance Value: 542 Ohm
Lead Channel Pacing Threshold Amplitude: 0.75 V
Lead Channel Pacing Threshold Amplitude: 0.75 V
Lead Channel Pacing Threshold Pulse Width: 0.4 ms
Lead Channel Pacing Threshold Pulse Width: 0.4 ms
Lead Channel Setting Pacing Amplitude: 1.5 V
Lead Channel Setting Pacing Amplitude: 2 V
Lead Channel Setting Pacing Pulse Width: 0.4 ms
Lead Channel Setting Sensing Sensitivity: 4 mV

## 2013-10-28 ENCOUNTER — Telehealth: Payer: Self-pay | Admitting: Internal Medicine

## 2013-10-28 NOTE — Telephone Encounter (Signed)
New message    Pt cannot get into mychart.   Do you know their activation code?

## 2013-10-28 NOTE — Telephone Encounter (Signed)
Advised them to call [phone]  (pt is showing as active so I am unable to generate a code for him) for help getting access completed. Pt's wife agreeable to plan.

## 2013-11-04 ENCOUNTER — Encounter: Payer: Self-pay | Admitting: *Deleted

## 2013-11-05 DIAGNOSIS — M999 Biomechanical lesion, unspecified: Secondary | ICD-10-CM | POA: Diagnosis not present

## 2013-11-05 DIAGNOSIS — M5137 Other intervertebral disc degeneration, lumbosacral region: Secondary | ICD-10-CM | POA: Diagnosis not present

## 2013-11-05 DIAGNOSIS — IMO0001 Reserved for inherently not codable concepts without codable children: Secondary | ICD-10-CM | POA: Diagnosis not present

## 2013-11-08 DIAGNOSIS — IMO0001 Reserved for inherently not codable concepts without codable children: Secondary | ICD-10-CM | POA: Diagnosis not present

## 2013-11-08 DIAGNOSIS — M5137 Other intervertebral disc degeneration, lumbosacral region: Secondary | ICD-10-CM | POA: Diagnosis not present

## 2013-11-08 DIAGNOSIS — M999 Biomechanical lesion, unspecified: Secondary | ICD-10-CM | POA: Diagnosis not present

## 2013-11-11 ENCOUNTER — Encounter: Payer: Self-pay | Admitting: Internal Medicine

## 2013-11-11 ENCOUNTER — Encounter: Payer: Self-pay | Admitting: Pulmonary Disease

## 2013-11-11 ENCOUNTER — Ambulatory Visit (INDEPENDENT_AMBULATORY_CARE_PROVIDER_SITE_OTHER): Payer: Medicare Other | Admitting: Pulmonary Disease

## 2013-11-11 VITALS — BP 138/92 | HR 74 | Temp 97.6°F | Ht 72.0 in | Wt 305.8 lb

## 2013-11-11 DIAGNOSIS — G4733 Obstructive sleep apnea (adult) (pediatric): Secondary | ICD-10-CM

## 2013-11-11 NOTE — Patient Instructions (Signed)
Will set up on cpap at a moderate pressure level.  Please call if having tolerance issues. Work on weight loss followup with me in 8 weeks.  

## 2013-11-11 NOTE — Progress Notes (Signed)
Subjective:    Patient ID: William Woods, male    DOB: 11/23/1945, 68 y.o.   MRN: [id number]  HPI The patient is a 68 year old male who I've been asked to see for management of obstructive sleep apnea. He has undergone home sleep testing last month, and over 3 nights that showed an AHI very from 26-55 events per hour. The patient has been noted to have loud snoring, as well as an abnormal breathing pattern during sleep. He has frequent awakenings during the night, and is unrested at least 50% of the mornings. The patient denies inappropriate daytime sleepiness while at work, but admits that he stays very busy. He has definite sleep pressure in the evenings, and falls asleep easily watching television or movies. He denies any sleepiness with driving. The patient states that his weight is neutral over the last few years, and his Epworth score today is only 4.     Sleep Questionnaire What time do you typically go to bed?( Between what hours) 11-12 11-12 at 1144 on 11/11/13 by Virl Cagey, CMA How long does it take you to fall asleep? 78mins 15mins at 1144 on 11/11/13 by Virl Cagey, CMA How many times during the night do you wake up? 4 4 at 1144 on 11/11/13 by Virl Cagey, CMA What time do you get out of bed to start your day? 0730 0730 at 1144 on 11/11/13 by Virl Cagey, CMA Do you drive or operate heavy machinery in your occupation? No No at 1144 on 11/11/13 by Virl Cagey, CMA How much has your weight changed (up or down) over the past two years? (In pounds) 10 lb (4.536 kg) 10 lb (4.536 kg) at 1144 on 11/11/13 by Virl Cagey, CMA Have you ever had a sleep study before? Yes Yes at 1144 on 11/11/13 by Virl Cagey, CMA If yes, location of study? If yes, date of study? 09/2013 09/2013 at 1144 on 11/11/13 by Virl Cagey, CMA Do you currently use CPAP? No No at 1144 on 11/11/13 by Virl Cagey, CMA Do you wear oxygen at any time? No No at 1144 on  11/11/13 by Virl Cagey, CMA   Review of Systems  Constitutional: Negative for fever and unexpected weight change.  HENT: Negative for congestion, dental problem, ear pain, nosebleeds, postnasal drip, rhinorrhea, sinus pressure, sneezing, sore throat and trouble swallowing.   Eyes: Negative for redness and itching.  Respiratory: Positive for cough. Negative for chest tightness, shortness of breath and wheezing.   Cardiovascular: Negative for palpitations and leg swelling.  Gastrointestinal: Negative for nausea and vomiting.  Genitourinary: Negative for dysuria.  Musculoskeletal: Negative for joint swelling.  Skin: Negative for rash.  Neurological: Negative for headaches.  Hematological: Does not bruise/bleed easily.  Psychiatric/Behavioral: Negative for dysphoric mood. The patient is not nervous/anxious.        Objective:   Physical Exam Constitutional:  Obese male, no acute distress  HENT:  Nares patent without discharge  Oropharynx without exudate, palate and uvula are thick and elongated.   Eyes:  Perrla, eomi, no scleral icterus  Neck:  No JVD, no TMG  Cardiovascular:  Normal rate, regular rhythm, no rubs or gallops.  No murmurs        Intact distal pulses  Pulmonary :  Normal breath sounds, no stridor or respiratory distress   No rales, rhonchi, or wheezing  Abdominal:  Soft, nondistended, bowel sounds present.  No tenderness noted.   Musculoskeletal:  mild lower extremity edema noted.  Lymph Nodes:  No cervical lymphadenopathy noted  Skin:  No cyanosis noted  Neurologic:  Alert, appropriate, moves all 4 extremities without obvious deficit.          Assessment & Plan:

## 2013-11-11 NOTE — Assessment & Plan Note (Signed)
The patient has severe obstructive sleep apnea by his recent home sleep testing, and has definite symptoms both during the night and day. It had a long discussion with him and his wife about the pathophysiology of sleep apnea, including its impact to his quality of life and cardiovascular health. His best treatment option for this is CPAP coupled with aggressive weight loss. The patient is willing to give this a try. I will set the patient up on cpap at a moderate pressure level to allow for desensitization, and will troubleshoot the device over the next 4-6weeks if needed.  The pt is to call me if having issues with tolerance.  Will then optimize the pressure once patient is able to wear cpap on a consistent basis.

## 2013-11-15 ENCOUNTER — Telehealth: Payer: Self-pay

## 2013-11-15 DIAGNOSIS — G4730 Sleep apnea, unspecified: Secondary | ICD-10-CM | POA: Insufficient documentation

## 2013-11-15 NOTE — Telephone Encounter (Signed)
Melissa from [facility] left voicemail stating that pt was seen by Dr. Caryl Comes 07/22/13 and sleep study was ordered thru [facility] on 09/10/13 via a phone note. Pt has Medicare, which requires a face-to-face discussion of sleep study and symptoms that led to it being ordered. Pt saw Dr. Gwenette Greet on 11/11/12 and diagnosed pt w/ OSA and ordered a CPAP. Melissa states that Dr. Caryl Comes can addend his note on 10/2 with the sleep study discussion, or Dr. Gwenette Greet can completely take over, however, this would mean pt would need to start the process over with a new sleep study.

## 2013-11-15 NOTE — Assessment & Plan Note (Signed)
Pt had sleep disordered breathing and with refractroy hypertension and morbid obesiity will undertake sleep study to clarify degree of sleep apnea;  Day time somnolence and fatigue also

## 2013-11-16 NOTE — Telephone Encounter (Addendum)
Pt chart 10/2 office visit addended by Dr. Caryl Comes yesterday 1/26. Also left message for Melissa at [facility] updating her about addendum.

## 2013-11-22 DIAGNOSIS — M5137 Other intervertebral disc degeneration, lumbosacral region: Secondary | ICD-10-CM | POA: Diagnosis not present

## 2013-11-22 DIAGNOSIS — M999 Biomechanical lesion, unspecified: Secondary | ICD-10-CM | POA: Diagnosis not present

## 2013-11-22 DIAGNOSIS — IMO0001 Reserved for inherently not codable concepts without codable children: Secondary | ICD-10-CM | POA: Diagnosis not present

## 2014-01-06 ENCOUNTER — Ambulatory Visit: Payer: Medicare Other | Admitting: Pulmonary Disease

## 2014-01-07 ENCOUNTER — Telehealth: Payer: Self-pay | Admitting: Internal Medicine

## 2014-01-07 NOTE — Telephone Encounter (Signed)
Wife calling stating Mr. Hallquist has been coughing, chest congestion and nose runny. Wants to know what he can take OTC.  States he does not have a fever or SOB.  His only medication is Losartan/HCTZ 100/12.5 mg (1/2) tablet.  Advised he could take plain Mucinex and Robitussin but needs to check with the pharmacist to verify.  She will get some Mucinex and speak w/pharmacist.

## 2014-01-07 NOTE — Telephone Encounter (Signed)
New message    Pt is having allergy symptons---coughing, congestion, runny nose. Pt states that he has coughed so much that his ribs hurt.  No fever, headache or sob.  What can he take that will not interfere with his medications or his long QT syndrome?

## 2014-01-12 ENCOUNTER — Telehealth: Payer: Self-pay | Admitting: *Deleted

## 2014-01-12 NOTE — Telephone Encounter (Signed)
Called patient to respond to fax his wife had sent Dr. Caryl Comes about congestion  Patient stated Sherri RN had already called to answer and the patient is feeling better

## 2014-01-14 ENCOUNTER — Encounter: Payer: Self-pay | Admitting: Pulmonary Disease

## 2014-01-14 ENCOUNTER — Ambulatory Visit (INDEPENDENT_AMBULATORY_CARE_PROVIDER_SITE_OTHER): Payer: Medicare Other | Admitting: Pulmonary Disease

## 2014-01-14 VITALS — BP 124/82 | HR 100 | Temp 97.6°F | Ht 72.0 in | Wt 310.4 lb

## 2014-01-14 DIAGNOSIS — G4733 Obstructive sleep apnea (adult) (pediatric): Secondary | ICD-10-CM

## 2014-01-14 NOTE — Progress Notes (Signed)
   Subjective:    Patient ID: William Woods, male    DOB: 13-Nov-1945, 68 y.o.   MRN: [id number]  HPI The patient comes in today for followup of his obstructive sleep apnea. He was started on CPAP at the last visit, and has done very well with the device. His download shows perfect compliance, no significant mask leak, and only mild breakthrough apnea on his moderate pressure setting. His symptoms have greatly improved both at night and during the day.   Review of Systems  Constitutional: Negative for fever and unexpected weight change.  HENT: Positive for congestion ( clear mucus), postnasal drip and rhinorrhea. Negative for dental problem, ear pain, nosebleeds, sinus pressure, sneezing, sore throat and trouble swallowing.   Eyes: Negative for redness and itching.  Respiratory: Positive for cough. Negative for chest tightness, shortness of breath and wheezing.   Cardiovascular: Negative for palpitations and leg swelling.  Gastrointestinal: Negative for nausea and vomiting.  Genitourinary: Negative for dysuria.  Musculoskeletal: Negative for joint swelling.  Skin: Negative for rash.  Neurological: Negative for headaches.  Hematological: Does not bruise/bleed easily.  Psychiatric/Behavioral: Negative for dysphoric mood. The patient is not nervous/anxious.        Objective:   Physical Exam Obese male in no acute distress Nose without purulence or discharge noted No skin breakdown or pressure necrosis from the CPAP mask Neck without lymphadenopathy or thyromegaly Lower extremities with mild edema, no cyanosis Alert and oriented, moves all 4 extremities.       Assessment & Plan:

## 2014-01-14 NOTE — Patient Instructions (Signed)
Will put your machine on the auto setting so that it self adjusts for you.   Work on weight loss followup with me in 60mos, but call if having issues.

## 2014-01-14 NOTE — Assessment & Plan Note (Signed)
The patient has done very well on CPAP by his download, with only mild breakthrough at his current pressure. He feels that he is sleeping much better, awakens refreshed, and has seen significant improvement in his daytime alertness. I have asked him to continue on his CPAP device, and we'll change his setting to the automatic mode. I've encouraged him to work aggressively on weight loss, and to keep up with his mask changes and supplies.

## 2014-01-25 DIAGNOSIS — M7730 Calcaneal spur, unspecified foot: Secondary | ICD-10-CM | POA: Diagnosis not present

## 2014-01-25 DIAGNOSIS — M7660 Achilles tendinitis, unspecified leg: Secondary | ICD-10-CM | POA: Diagnosis not present

## 2014-01-26 ENCOUNTER — Encounter: Payer: Self-pay | Admitting: Internal Medicine

## 2014-01-26 ENCOUNTER — Ambulatory Visit (INDEPENDENT_AMBULATORY_CARE_PROVIDER_SITE_OTHER): Payer: Medicare Other | Admitting: *Deleted

## 2014-01-26 DIAGNOSIS — Z950 Presence of cardiac pacemaker: Secondary | ICD-10-CM

## 2014-01-26 DIAGNOSIS — R55 Syncope and collapse: Secondary | ICD-10-CM

## 2014-01-26 DIAGNOSIS — I442 Atrioventricular block, complete: Secondary | ICD-10-CM

## 2014-02-01 LAB — MDC_IDC_ENUM_SESS_TYPE_REMOTE
Battery Impedance: 160 Ohm
Battery Remaining Longevity: 130 mo
Battery Voltage: 2.78 V
Brady Statistic AP VP Percent: 45 %
Brady Statistic AP VS Percent: 1 %
Brady Statistic AS VP Percent: 52 %
Brady Statistic AS VS Percent: 2 %
Date Time Interrogation Session: 20150408105454
Lead Channel Impedance Value: 408 Ohm
Lead Channel Impedance Value: 554 Ohm
Lead Channel Pacing Threshold Amplitude: 0.75 V
Lead Channel Pacing Threshold Amplitude: 0.75 V
Lead Channel Pacing Threshold Pulse Width: 0.4 ms
Lead Channel Pacing Threshold Pulse Width: 0.4 ms
Lead Channel Sensing Intrinsic Amplitude: 2.8 mV
Lead Channel Setting Pacing Amplitude: 1.5 V
Lead Channel Setting Pacing Amplitude: 2 V
Lead Channel Setting Pacing Pulse Width: 0.4 ms
Lead Channel Setting Sensing Sensitivity: 4 mV

## 2014-02-17 DIAGNOSIS — I4581 Long QT syndrome: Secondary | ICD-10-CM | POA: Diagnosis not present

## 2014-02-17 DIAGNOSIS — Z0000 Encounter for general adult medical examination without abnormal findings: Secondary | ICD-10-CM | POA: Diagnosis not present

## 2014-02-17 DIAGNOSIS — M109 Gout, unspecified: Secondary | ICD-10-CM | POA: Diagnosis not present

## 2014-02-17 DIAGNOSIS — G4730 Sleep apnea, unspecified: Secondary | ICD-10-CM | POA: Diagnosis not present

## 2014-02-17 DIAGNOSIS — Z7189 Other specified counseling: Secondary | ICD-10-CM | POA: Diagnosis not present

## 2014-02-20 ENCOUNTER — Other Ambulatory Visit: Payer: Self-pay | Admitting: Internal Medicine

## 2014-02-21 ENCOUNTER — Other Ambulatory Visit: Payer: Self-pay

## 2014-02-21 MED ORDER — LOSARTAN POTASSIUM-HCTZ 100-12.5 MG PO TABS: ORAL_TABLET | ORAL | Status: DC

## 2014-02-23 ENCOUNTER — Encounter: Payer: Self-pay | Admitting: Cardiology

## 2014-03-01 ENCOUNTER — Ambulatory Visit (INDEPENDENT_AMBULATORY_CARE_PROVIDER_SITE_OTHER): Payer: Medicare Other | Admitting: Internal Medicine

## 2014-03-01 ENCOUNTER — Encounter: Payer: Self-pay | Admitting: Internal Medicine

## 2014-03-01 VITALS — BP 122/77 | HR 95 | Ht 72.0 in | Wt 307.5 lb

## 2014-03-01 DIAGNOSIS — I442 Atrioventricular block, complete: Secondary | ICD-10-CM

## 2014-03-01 DIAGNOSIS — I4581 Long QT syndrome: Secondary | ICD-10-CM | POA: Diagnosis not present

## 2014-03-01 DIAGNOSIS — I10 Essential (primary) hypertension: Secondary | ICD-10-CM | POA: Diagnosis not present

## 2014-03-01 LAB — MDC_IDC_ENUM_SESS_TYPE_INCLINIC
Battery Impedance: 160 Ohm
Battery Remaining Longevity: 129 mo
Battery Voltage: 2.78 V
Brady Statistic AP VP Percent: 42 %
Brady Statistic AP VS Percent: 1 %
Brady Statistic AS VP Percent: 55 %
Brady Statistic AS VS Percent: 2 %
Date Time Interrogation Session: 20150512104112
Lead Channel Impedance Value: 403 Ohm
Lead Channel Impedance Value: 519 Ohm
Lead Channel Pacing Threshold Amplitude: 0.75 V
Lead Channel Pacing Threshold Amplitude: 0.75 V
Lead Channel Pacing Threshold Pulse Width: 0.4 ms
Lead Channel Pacing Threshold Pulse Width: 0.4 ms
Lead Channel Sensing Intrinsic Amplitude: 11.2 mV
Lead Channel Sensing Intrinsic Amplitude: 4 mV
Lead Channel Setting Pacing Amplitude: 1.5 V
Lead Channel Setting Pacing Amplitude: 2 V
Lead Channel Setting Pacing Pulse Width: 0.4 ms
Lead Channel Setting Sensing Sensitivity: 4 mV

## 2014-03-01 MED ORDER — FUROSEMIDE 40 MG PO TABS: ORAL_TABLET | ORAL | Status: DC

## 2014-03-01 NOTE — Patient Instructions (Addendum)
Your physician recommends that you have lab work today: Navos  Your physician has recommended you make the following change in your medication:  Start Lasix 40 mg three times per week   Your physician recommends that you schedule a follow-up appointment in:  3 weeks

## 2014-03-01 NOTE — Progress Notes (Signed)
Patient Care Team: Jackolyn Confer, MD as PCP - General (Internal Medicine)   HPI  William Woods is a 68 y.o. male Seen in followup for pacemaker implanted for symptomatic intermittent complete heart block April 2013. He had a history of syncope with a prolonged QT interval and it undergone loop recorder insertion. With recurrent syncope heart block was identified.   Also he was treated with beta blockers for hypertension and long QT. He has stopped this and his now on losartan    He has been intercurrently diagnosed with Sleep apnea and has been improved with CPAP  His BP has been much better since the control of his sleep apnea  He denies edema  His exercise tolerance is much improved  He has had a recent flare up of his gout  The family carries a diagnosis of long QT syndrome. Financial issues that precluded genetic testing. Both of his sons are deceased but unassociated with sudden death. In the past he has been advised of the websites QT drugs.org and his wife place close attention to it. He is a potassium sparing thiazide-containing diuretic. He had problems with this contributing to gallop.   Past Medical History  Diagnosis Date  . Long Q-T syndrome   . Hypertension   . Thyroid disease   . Obesity (BMI 30-39.9)   . Obstructive sleep apnea   . Edema     Suspect diastolic heart failure  . Syncope   . CHB (complete heart block)   . Pacemaker     Medtronic Adapta L model ADDRL 1 (serial number NWE [id number]) pacemaker.    Past Surgical History  Procedure Laterality Date  . Pacemaker insertion  02/04/2012    Medtronic Adapta L model ADDRL 1 (serial number NWE [id number]) pacemaker.  . Other surgical history  02/04/2012     removal of a previously implanted MDT Reveal XT implantable loop recorder  . Cardiac catheterization      [facility]   . Cardiac catheterization  01/2012    [facility]: no significant CAD, EF 40%.     Current Outpatient Prescriptions  Medication Sig  Dispense Refill  . losartan-hydrochlorothiazide (HYZAAR) 100-12.5 MG per tablet Take one half tablet by mouth daily.  15 tablet  3   No current facility-administered medications for this visit.    Allergies  Allergen Reactions  . Other     LONG QT SYNDROME--MANY DRUGS PATIENT HAS TO AVOID SEE WEBSITE FOR DETAILS Http://www.sads.org.uk/drugs_to_avoid.htm     Review of Systems negative except from HPI and PMH  Physical Exam BP 122/77  Pulse 95  Ht 6' (1.829 m)  Wt 307 lb 8 oz (139.481 kg)  BMI 41.70 kg/m2 Well developed and well nourished in no acute distress HENT normal E scleral and icterus clear Neck Supple JVP flat; carotids brisk and full Clear to ausculation Device pocket well healed; without hematoma or erythema.  There is no tethering  *Regular rate and rhythm, no murmurs gallops or rub Soft with active bowel sounds No clubbing cyanosis 2+ Edema Alert and oriented, grossly normal motor and sensory function Skin Warm and Dry  ECG  P-synchronous/ AV  pacing   Assessment and  Plan  Complete Heart block stable  Pacemaker- Medtronic  The patient's device was interrogated.  The information was reviewed. No changes were made in the programming.    OSA  Much improved  Continue therapy  HFpEF--volume overload chronic  Gout  HTN   His blood pressure  control is much improved.  We willneed to check his K  Will give him a short burst of lasix to address his edema and volume  He will resume his allopurinol

## 2014-03-02 LAB — BASIC METABOLIC PANEL
BUN/Creatinine Ratio: 18 (ref 10–22)
BUN: 21 mg/dL (ref 8–27)
CO2: 26 mmol/L (ref 18–29)
Calcium: 9.5 mg/dL (ref 8.6–10.2)
Chloride: 102 mmol/L (ref 97–108)
Creatinine, Ser: 1.15 mg/dL (ref 0.76–1.27)
GFR calc Af Amer: 76 mL/min/{1.73_m2} (ref >59)
GFR calc non Af Amer: 66 mL/min/{1.73_m2} (ref >59)
Glucose: 95 mg/dL (ref 65–99)
Potassium: 4.5 mmol/L (ref 3.5–5.2)
Sodium: 143 mmol/L (ref 134–144)

## 2014-03-23 ENCOUNTER — Ambulatory Visit (INDEPENDENT_AMBULATORY_CARE_PROVIDER_SITE_OTHER): Payer: Medicare Other | Admitting: Internal Medicine

## 2014-03-23 ENCOUNTER — Encounter: Payer: Self-pay | Admitting: Internal Medicine

## 2014-03-23 VITALS — BP 126/82 | HR 65 | Ht 72.0 in | Wt 307.0 lb

## 2014-03-23 DIAGNOSIS — I442 Atrioventricular block, complete: Secondary | ICD-10-CM | POA: Diagnosis not present

## 2014-03-23 MED ORDER — LOSARTAN POTASSIUM 100 MG PO TABS: 100.0000 mg | ORAL_TABLET | Freq: Every day | ORAL | Status: DC

## 2014-03-23 NOTE — Patient Instructions (Addendum)
Your physician has recommended you make the following change in your medication:  Change Losartan HCTZ to Just Losartan 100 mg daily   Your physician wants you to follow-up in: Dr. Caryl Comes in 6 months. You will receive a reminder letter in the mail two months in advance. If you don't receive a letter, please call our office to schedule the follow-up appointment.

## 2014-03-23 NOTE — Progress Notes (Signed)
Patient Care Team: Jackolyn Confer, MD as PCP - General (Internal Medicine)   HPI  William Woods is a 68 y.o. male Seen in followup for pacemaker implanted for symptomatic intermittent complete heart block April 2013. He had a history of syncope with a prolonged QT interval and it undergone loop recorder insertion. With recurrent syncope heart block was identified.   Also he was treated with beta blockers for hypertension and long QT. He has stopped this and his now on losartan    He has been intercurrently diagnosed with Sleep apnea and has been improved with CPAP  His BP has been much better since the control of his sleep apnea     Edema is not better. Dietary salt intake is high. He      Past Medical History  Diagnosis Date  . Long Q-T syndrome   . Hypertension   . Thyroid disease   . Obesity (BMI 30-39.9)   . Obstructive sleep apnea   . Edema     Suspect diastolic heart failure  . Syncope   . CHB (complete heart block)   . Pacemaker     Medtronic Adapta L model ADDRL 1 (serial number NWE [id number]) pacemaker.    Past Surgical History  Procedure Laterality Date  . Pacemaker insertion  02/04/2012    Medtronic Adapta L model ADDRL 1 (serial number NWE [id number]) pacemaker.  . Other surgical history  02/04/2012     removal of a previously implanted MDT Reveal XT implantable loop recorder  . Cardiac catheterization      [facility]   . Cardiac catheterization  01/2012    [facility]: no significant CAD, EF 40%.     Current Outpatient Prescriptions  Medication Sig Dispense Refill  . furosemide (LASIX) 40 MG tablet Take one 40 mg tablet three days per week.  30 tablet  0  . GARCINIA CAMBOGIA-CHROMIUM PO Take by mouth daily.      Marland Kitchen losartan-hydrochlorothiazide (HYZAAR) 100-12.5 MG per tablet Take one half tablet by mouth daily.  15 tablet  3   No current facility-administered medications for this visit.    Allergies  Allergen Reactions  . Other     LONG QT  SYNDROME--MANY DRUGS PATIENT HAS TO AVOID SEE WEBSITE FOR DETAILS Http://www.sads.org.uk/drugs_to_avoid.htm     Review of Systems negative except from HPI and PMH  Physical Exam BP 126/82  Pulse 65  Ht 6' (1.829 m)  Wt 307 lb (139.254 kg)  BMI 41.63 kg/m2 Well developed and well nourished in no acute distress HENT normal E scleral and icterus clear Neck Supple JVP flat; carotids brisk and full Clear to ausculation Device pocket well healed; without hematoma or erythema.  There is no tethering  *Regular rate and rhythm, no murmurs gallops or rub Soft with active bowel sounds No clubbing cyanosis 2+ Edema Alert and oriented, grossly normal motor and sensory function Skin Warm and Dry  ECG  P-synchronous/ AV  pacing   Assessment and  Plan  Complete Heart block stable  Pacemaker- Medtronic  The patient's device was interrogated.  The information was reviewed. No changes were made in the programming.    OSA  Much improved  Continue therapy  HFpEF--volume overload chronic  Gout vs arthritis  HTN   His blood pressure control is much improved.  We willneed to check his K  We will continue his furosemide edema persists. We have also reviewed the importance of salt and recommended that he be more  cognizant of dietary salt intake We will decrease his antihypertensives by removing his hydrochlorothiazide and we will continue furosemide

## 2014-03-25 ENCOUNTER — Encounter: Payer: Self-pay | Admitting: Internal Medicine

## 2014-03-25 ENCOUNTER — Telehealth: Payer: Self-pay | Admitting: Internal Medicine

## 2014-03-25 DIAGNOSIS — L259 Unspecified contact dermatitis, unspecified cause: Secondary | ICD-10-CM | POA: Diagnosis not present

## 2014-03-25 MED ORDER — LOSARTAN POTASSIUM 100 MG PO TABS: 50.0000 mg | ORAL_TABLET | Freq: Every day | ORAL | Status: DC

## 2014-03-25 NOTE — Telephone Encounter (Signed)
Follow up    Pt's wife called needs a call back she will be at work till 5 pm

## 2014-03-25 NOTE — Telephone Encounter (Signed)
New message          Pt needs clarification of medication that was changed the last time he was here

## 2014-03-25 NOTE — Telephone Encounter (Signed)
Spoke with pt's wife. (Pt not at home at the moment and she called in to begin with). Pt was taking half pill of Hyzaar. Corrected medication dosage to reflect taking 1/2 Losartan 100 mg tablet. Pt's wife agreeable to splitting pill in half. She also explained cost may be too high, they will investigate prices and call back if need help with this.

## 2014-03-25 NOTE — Telephone Encounter (Signed)
Follow up     Talk to the nurse regarding medications

## 2014-03-25 NOTE — Telephone Encounter (Signed)
This encounter was created in error - please disregard.

## 2014-03-28 ENCOUNTER — Telehealth: Payer: Self-pay | Admitting: Internal Medicine

## 2014-03-28 MED ORDER — LOSARTAN POTASSIUM 100 MG PO TABS: 50.0000 mg | ORAL_TABLET | Freq: Every day | ORAL | Status: DC

## 2014-03-28 NOTE — Telephone Encounter (Signed)
Sent rx for Losartan to requested pharmacy

## 2014-03-28 NOTE — Telephone Encounter (Signed)
New message     Want to make sure you call in a presc for losartin to [facility] in [place]---[address]

## 2014-05-06 DIAGNOSIS — L719 Rosacea, unspecified: Secondary | ICD-10-CM | POA: Diagnosis not present

## 2014-05-23 ENCOUNTER — Other Ambulatory Visit: Payer: Self-pay

## 2014-05-23 DIAGNOSIS — I442 Atrioventricular block, complete: Secondary | ICD-10-CM

## 2014-05-23 DIAGNOSIS — I4581 Long QT syndrome: Secondary | ICD-10-CM

## 2014-05-23 MED ORDER — FUROSEMIDE 40 MG PO TABS: ORAL_TABLET | ORAL | Status: DC

## 2014-06-02 ENCOUNTER — Ambulatory Visit (INDEPENDENT_AMBULATORY_CARE_PROVIDER_SITE_OTHER): Payer: Medicare Other | Admitting: *Deleted

## 2014-06-02 DIAGNOSIS — I442 Atrioventricular block, complete: Secondary | ICD-10-CM | POA: Diagnosis not present

## 2014-06-02 DIAGNOSIS — R55 Syncope and collapse: Secondary | ICD-10-CM

## 2014-06-02 NOTE — Progress Notes (Signed)
Remote pacemaker transmission.   

## 2014-06-03 LAB — MDC_IDC_ENUM_SESS_TYPE_REMOTE
Battery Remaining Longevity: 65 mo
Battery Voltage: 2.78 V
Brady Statistic AP VP Percent: 19.1 %
Brady Statistic AP VS Percent: 0.1 %
Brady Statistic AS VP Percent: 80.9 %
Brady Statistic AS VS Percent: 0.1 %
Lead Channel Impedance Value: 413 Ohm
Lead Channel Impedance Value: 487 Ohm
Lead Channel Pacing Threshold Amplitude: 0.75 V
Lead Channel Pacing Threshold Amplitude: 1 V
Lead Channel Pacing Threshold Pulse Width: 0.4 ms
Lead Channel Pacing Threshold Pulse Width: 0.4 ms
Lead Channel Sensing Intrinsic Amplitude: 2.8 mV
Lead Channel Setting Pacing Amplitude: 1.5 V
Lead Channel Setting Pacing Amplitude: 2 V
Lead Channel Setting Pacing Pulse Width: 0.4 ms
Lead Channel Setting Sensing Sensitivity: 4 mV

## 2014-06-14 ENCOUNTER — Encounter: Payer: Self-pay | Admitting: Cardiology

## 2014-06-16 ENCOUNTER — Ambulatory Visit: Payer: Medicare Other | Admitting: Pulmonary Disease

## 2014-06-20 ENCOUNTER — Ambulatory Visit (INDEPENDENT_AMBULATORY_CARE_PROVIDER_SITE_OTHER): Payer: Medicare Other | Admitting: Pulmonary Disease

## 2014-06-20 ENCOUNTER — Encounter: Payer: Self-pay | Admitting: Pulmonary Disease

## 2014-06-20 VITALS — BP 122/72 | HR 78 | Temp 98.7°F | Ht 72.0 in | Wt 311.0 lb

## 2014-06-20 DIAGNOSIS — G4733 Obstructive sleep apnea (adult) (pediatric): Secondary | ICD-10-CM | POA: Diagnosis not present

## 2014-06-20 NOTE — Progress Notes (Signed)
   Subjective:    Patient ID: William Woods, male    DOB: 05/19/1946, 68 y.o.   MRN: [id number]  HPI The patient comes in today for followup of his obstructive sleep apnea. He is wearing CPAP compliantly, and is having no issues with his mask fit or pressure. He feels that he is sleeping well with his device, and has adequate daytime alertness.   Review of Systems  Constitutional: Negative for fever and unexpected weight change.  HENT: Negative for congestion, dental problem, ear pain, nosebleeds, postnasal drip, rhinorrhea, sinus pressure, sneezing, sore throat and trouble swallowing.   Eyes: Negative for redness and itching.  Respiratory: Negative for cough, chest tightness, shortness of breath and wheezing.   Cardiovascular: Negative for palpitations and leg swelling.  Gastrointestinal: Negative for nausea and vomiting.  Genitourinary: Negative for dysuria.  Musculoskeletal: Negative for joint swelling.  Skin: Negative for rash.  Neurological: Negative for headaches.  Hematological: Does not bruise/bleed easily.  Psychiatric/Behavioral: Negative for dysphoric mood. The patient is not nervous/anxious.        Objective:   Physical Exam Obese male in no acute distress Nose without purulence or discharge noted Neck without lymphadenopathy or thyromegaly No skin breakdown or pressure necrosis from the CPAP mask Lower extremities with minimal edema, no cyanosis Alert and oriented, moves all 4 extremities.       Assessment & Plan:

## 2014-06-20 NOTE — Patient Instructions (Signed)
Continue with cpap, and keep up with mask changes and supplies. Work on weight reduction followup with again in one year.

## 2014-06-20 NOTE — Assessment & Plan Note (Signed)
Patient is doing well with his CPAP device, and is satisfied with his symptom response. He is having no issues with his mask fit or pressure. I've asked him to continue on the device, and to work aggressively on weight loss. I will see him back in one year.

## 2014-06-22 ENCOUNTER — Encounter: Payer: Self-pay | Admitting: Internal Medicine

## 2014-08-05 ENCOUNTER — Other Ambulatory Visit: Payer: Self-pay

## 2014-09-05 ENCOUNTER — Ambulatory Visit (INDEPENDENT_AMBULATORY_CARE_PROVIDER_SITE_OTHER): Payer: Medicare Other | Admitting: *Deleted

## 2014-09-05 ENCOUNTER — Telehealth: Payer: Self-pay | Admitting: Cardiology

## 2014-09-05 DIAGNOSIS — R55 Syncope and collapse: Secondary | ICD-10-CM

## 2014-09-05 DIAGNOSIS — I442 Atrioventricular block, complete: Secondary | ICD-10-CM

## 2014-09-05 NOTE — Telephone Encounter (Signed)
LMOVM reminding pt to send remote transmission.   

## 2014-09-06 DIAGNOSIS — I442 Atrioventricular block, complete: Secondary | ICD-10-CM | POA: Diagnosis not present

## 2014-09-06 LAB — MDC_IDC_ENUM_SESS_TYPE_REMOTE
Battery Impedance: 184 Ohm
Battery Remaining Longevity: 125 mo
Battery Voltage: 2.79 V
Brady Statistic AP VP Percent: 26 %
Brady Statistic AP VS Percent: 0 %
Brady Statistic AS VP Percent: 74 %
Brady Statistic AS VS Percent: 0 %
Date Time Interrogation Session: 20151117115634
Lead Channel Impedance Value: 392 Ohm
Lead Channel Impedance Value: 497 Ohm
Lead Channel Pacing Threshold Amplitude: 0.75 V
Lead Channel Pacing Threshold Amplitude: 0.875 V
Lead Channel Pacing Threshold Pulse Width: 0.4 ms
Lead Channel Pacing Threshold Pulse Width: 0.4 ms
Lead Channel Sensing Intrinsic Amplitude: 2.8 mV
Lead Channel Setting Pacing Amplitude: 1.5 V
Lead Channel Setting Pacing Amplitude: 2 V
Lead Channel Setting Pacing Pulse Width: 0.4 ms
Lead Channel Setting Sensing Sensitivity: 4 mV

## 2014-09-06 NOTE — Progress Notes (Signed)
Remote pacemaker transmission.   

## 2014-09-14 ENCOUNTER — Other Ambulatory Visit: Payer: Self-pay | Admitting: Internal Medicine

## 2014-09-21 ENCOUNTER — Encounter: Payer: Self-pay | Admitting: Cardiology

## 2014-09-26 ENCOUNTER — Encounter: Payer: Self-pay | Admitting: Internal Medicine

## 2014-09-27 ENCOUNTER — Ambulatory Visit (INDEPENDENT_AMBULATORY_CARE_PROVIDER_SITE_OTHER): Payer: Medicare Other | Admitting: Internal Medicine

## 2014-09-27 ENCOUNTER — Encounter: Payer: Self-pay | Admitting: Internal Medicine

## 2014-09-27 VITALS — BP 144/90 | HR 72 | Ht 72.0 in | Wt 313.5 lb

## 2014-09-27 DIAGNOSIS — I442 Atrioventricular block, complete: Secondary | ICD-10-CM

## 2014-09-27 LAB — MDC_IDC_ENUM_SESS_TYPE_INCLINIC
Battery Impedance: 184 Ohm
Battery Remaining Longevity: 125 mo
Battery Voltage: 2.78 V
Brady Statistic AP VP Percent: 26 %
Brady Statistic AP VS Percent: 0 %
Brady Statistic AS VP Percent: 74 %
Brady Statistic AS VS Percent: 0 %
Date Time Interrogation Session: 20151208150831
Lead Channel Impedance Value: 408 Ohm
Lead Channel Impedance Value: 485 Ohm
Lead Channel Pacing Threshold Amplitude: 0.75 V
Lead Channel Pacing Threshold Amplitude: 0.75 V
Lead Channel Pacing Threshold Pulse Width: 0.4 ms
Lead Channel Pacing Threshold Pulse Width: 0.4 ms
Lead Channel Sensing Intrinsic Amplitude: 5.6 mV
Lead Channel Setting Pacing Amplitude: 1.5 V
Lead Channel Setting Pacing Amplitude: 2 V
Lead Channel Setting Pacing Pulse Width: 0.4 ms
Lead Channel Setting Sensing Sensitivity: 4 mV

## 2014-09-27 MED ORDER — FUROSEMIDE 40 MG PO TABS: ORAL_TABLET | ORAL | Status: DC

## 2014-09-27 MED ORDER — LOSARTAN POTASSIUM 100 MG PO TABS: 50.0000 mg | ORAL_TABLET | Freq: Two times a day (BID) | ORAL | Status: DC

## 2014-09-27 NOTE — Patient Instructions (Addendum)
Your physician wants you to follow-up in: 1 year with Dr. Caryl Comes. You will receive a reminder letter in the mail two months in advance. If you don't receive a letter, please call our office to schedule the follow-up appointment.  Remote monitoring is used to monitor your Pacemaker of ICD from home. This monitoring reduces the number of office visits required to check your device to one time per year. It allows [place] to keep an eye on the functioning of your device to ensure it is working properly. You are scheduled for a device check from home on 12/27/2014. You may send your transmission at any time that day. If you have a wireless device, the transmission will be sent automatically. After your physician reviews your transmission, you will receive a postcard with your next transmission date.   Your medications have been refilled as requested.

## 2014-09-27 NOTE — Progress Notes (Signed)
      Patient Care Team: Jackolyn Confer, MD as PCP - General (Internal Medicine)   HPI  William Woods is a 68 y.o. male Seen in followup for pacemaker implanted for symptomatic intermittent complete heart block April 2013. He had a history of syncope with a prolonged QT interval and it undergone loop recorder insertion. With recurrent syncope heart block was identified.   Also he was treated with beta blockers for hypertension and long QT. He has stopped this and his now on losartan    He has been intercurrently diagnosed with Sleep apnea and has been improved with CPAP  His BP has been much better since the control of his sleep apnea     Edema is not better. Dietary salt intake is high.   Today he told me of the death of his two sons, one from cancer and one from cocaine overdose  And Gods calling him back via [employer] [facility] [employer] that he Gave      Past Medical History  Diagnosis Date  . Long Q-T syndrome   . Hypertension   . Thyroid disease   . Obesity (BMI 30-39.9)   . Obstructive sleep apnea   . Edema     Suspect diastolic heart failure  . Syncope   . CHB (complete heart block)   . Pacemaker     Medtronic Adapta L model ADDRL 1 (serial number NWE [id number]) pacemaker.    Past Surgical History  Procedure Laterality Date  . Pacemaker insertion  02/04/2012    Medtronic Adapta L model ADDRL 1 (serial number NWE [id number]) pacemaker.  . Other surgical history  02/04/2012     removal of a previously implanted MDT Reveal XT implantable loop recorder  . Cardiac catheterization      [facility]   . Cardiac catheterization  01/2012    [facility]: no significant CAD, EF 40%.     Current Outpatient Prescriptions  Medication Sig Dispense Refill  . furosemide (LASIX) 40 MG tablet TAKE ONE TABLET BY MOUTH 3 DAYS PER WEEK. 90 tablet 0  . losartan (COZAAR) 100 MG tablet Take 50 mg by mouth 2 (two) times daily.     No current facility-administered medications for this visit.    Allergies   Allergen Reactions  . Other     LONG QT SYNDROME--MANY DRUGS PATIENT HAS TO AVOID SEE WEBSITE FOR DETAILS Http://www.sads.org.uk/drugs_to_avoid.htm     Review of Systems negative except from HPI and PMH  Physical Exam BP 144/90 mmHg  Pulse 72  Ht 6' (1.829 m)  Wt 313 lb 8 oz (142.203 kg)  BMI 42.51 kg/m2 Well developed and well nourished in no acute distress HENT normal E scleral and icterus clear Neck Supple JVP flat; carotids brisk and full Clear to ausculation Device pocket well healed; without hematoma or erythema.  There is no tethering  *Regular rate and rhythm, no murmurs gallops or rub Soft with active bowel sounds No clubbing cyanosis 2+ Edema Alert and oriented, grossly normal motor and sensory function Skin Warm and Dry  ECG  P-synchronous/ AV  pacing   Assessment and  Plan  Complete Heart block stable  Pacemaker- Medtronic  The patient's device was interrogated.  The information was reviewed. No changes were made in the programming.    OSA  Much improved  Continue therapy  HFpEF--volume overload chronic    HTN  Stable  Continue current meds

## 2014-09-29 ENCOUNTER — Encounter (HOSPITAL_COMMUNITY): Payer: Self-pay | Admitting: Internal Medicine

## 2014-10-04 ENCOUNTER — Encounter: Payer: Medicare Other | Admitting: Internal Medicine

## 2014-10-06 ENCOUNTER — Encounter: Payer: Self-pay | Admitting: Cardiology

## 2014-10-24 ENCOUNTER — Telehealth: Payer: Self-pay | Admitting: Internal Medicine

## 2014-10-24 NOTE — Telephone Encounter (Signed)
Patient called stating he needs appt with [facility]   For stopped up ear   /see Dr Pryor Ochoa  , pt has [facility] they will not make him appt until he has referral from PCP

## 2014-10-24 NOTE — Telephone Encounter (Signed)
[employer] generally requires that we see a patient first and document reason for referral. Pt has not been seen in some time. Please schedule 88min follow up.

## 2014-10-25 NOTE — Telephone Encounter (Signed)
Spoke with pt he does not want to make appt   / states he will found another doctor

## 2014-10-28 ENCOUNTER — Encounter: Payer: Self-pay | Admitting: *Deleted

## 2014-11-01 ENCOUNTER — Telehealth: Payer: Self-pay | Admitting: Internal Medicine

## 2014-11-01 NOTE — Telephone Encounter (Signed)
Patient dismissal form and cover letter emailed to HIM.

## 2014-11-02 ENCOUNTER — Telehealth: Payer: Self-pay | Admitting: Internal Medicine

## 2014-11-02 NOTE — Telephone Encounter (Signed)
Dismissal Letter sent by Certified Mail 07/57/3225  Received the Return Receipt showing someone picked up the Dismissal 11/09/2014

## 2014-11-25 ENCOUNTER — Encounter: Payer: Self-pay | Admitting: Internal Medicine

## 2014-12-27 ENCOUNTER — Ambulatory Visit (INDEPENDENT_AMBULATORY_CARE_PROVIDER_SITE_OTHER): Payer: Commercial Managed Care - HMO | Admitting: *Deleted

## 2014-12-27 DIAGNOSIS — I442 Atrioventricular block, complete: Secondary | ICD-10-CM

## 2014-12-27 LAB — MDC_IDC_ENUM_SESS_TYPE_REMOTE
Battery Impedance: 184 Ohm
Battery Remaining Longevity: 127 mo
Battery Voltage: 2.78 V
Brady Statistic AP VP Percent: 24 %
Brady Statistic AP VS Percent: 0 %
Brady Statistic AS VP Percent: 76 %
Brady Statistic AS VS Percent: 0 %
Date Time Interrogation Session: 20160308142646
Lead Channel Impedance Value: 403 Ohm
Lead Channel Impedance Value: 542 Ohm
Lead Channel Pacing Threshold Amplitude: 0.75 V
Lead Channel Pacing Threshold Amplitude: 0.875 V
Lead Channel Pacing Threshold Pulse Width: 0.4 ms
Lead Channel Pacing Threshold Pulse Width: 0.4 ms
Lead Channel Sensing Intrinsic Amplitude: 2.8 mV
Lead Channel Setting Pacing Amplitude: 1.5 V
Lead Channel Setting Pacing Amplitude: 2 V
Lead Channel Setting Pacing Pulse Width: 0.4 ms
Lead Channel Setting Sensing Sensitivity: 4 mV

## 2014-12-27 NOTE — Progress Notes (Signed)
Remote pacemaker transmission.   

## 2015-01-05 ENCOUNTER — Encounter: Payer: Self-pay | Admitting: Cardiology

## 2015-01-11 ENCOUNTER — Encounter: Payer: Self-pay | Admitting: Internal Medicine

## 2015-01-19 ENCOUNTER — Encounter: Payer: Self-pay | Admitting: Cardiology

## 2015-02-09 ENCOUNTER — Encounter: Payer: Self-pay | Admitting: Family Medicine

## 2015-02-12 NOTE — H&P (Signed)
PATIENT NAME:  William Woods, William Woods MR#:  [id number] DATE OF BIRTH:  [birth year]  DATE OF ADMISSION:  01/19/2012  PRIMARY CARE PHYSICIAN:  Dr. Eliberto Ivory CARDIOLOGIST:  Dr. Caryl Comes     CHIEF COMPLAINT: " I passed out."   HISTORY OF PRESENT ILLNESS: This is a 69 year old man who has been followed by Dr. Caryl Comes for prolonged QT interval. He presents with a passing out episode. He states that he was walking on his way to church. He may have walked around 400 feet. He developed dizziness where he had to hold onto a pole. The next thing he knew he was in the ambulance. He hit the back of his head and blood was coming from the site.  At the end of January he stated he did pass out also at that time where he said he walked for a period of time and then ended up on the ground again. He hurt his ribs at that time. He has been followed by Dr. Caryl Comes for a long QT interval, and they have been trying to regulate his blood pressure. He was previously on hydrochlorothiazide and spironolactone but last week he stopped all medications and has been monitoring his blood pressure on his own. Last Sunday he also felt like he was going to faint but he did not faint.  In the Emergency Room he was found to be bradycardic requiring a dose of atropine IV for symptomatic bradycardia.  Currently when I saw him, heart rate was in the 80s.   PAST MEDICAL HISTORY:  1. History of prolonged QT.  2. Hypertension.   PAST SURGICAL HISTORY: None.   ALLERGIES: No known drug allergies.   MEDICATIONS:  1. Testosterone injections twice a week. 2. Anastrozole 0.5 mg daily.  3. B complex daily.  4. Vitamin C daily.  5. Potassium supplementation daily.  6. Magnesium supplementation daily.  7. Herbal seropeptase?. 8. Armour Thyroid 60 mg daily.   SOCIAL HISTORY: Quit smoking 23 years ago. No alcohol. No drug use. Does desk work and computer work.   FAMILY HISTORY: Mother died at 34 of leukemia. Father died at 31 of kidney failure, also had long  QT interval.     REVIEW OF SYSTEMS:  CONSTITUTIONAL: Positive for sweating. No fever or chills. Positive for weight gain. No weakness. EYES: He does wear reading glasses. EARS, NOSE, MOUTH, AND THROAT: No hearing loss. No sore throat. No difficulty swallowing. CARDIOVASCULAR: No chest pain but rib pain from previous fall. RESPIRATORY: No shortness of breath. No coughing. No sputum. No hemoptysis. GASTROINTESTINAL: No nausea. No vomiting. No abdominal pain. No diarrhea. No constipation. No bright red blood per rectum. No melena. GENITOURINARY: No burning on urination or hematuria. MUSCULOSKELETAL: No joint pain or muscle pain besides the ribs. NEUROLOGIC: Positive for fainting episodes and near fainting episodes. PSYCHIATRIC: No anxiety or depression. ENDOCRINE: Positive for hypothyroidism. HEMATOLOGIC/LYMPHATIC: No anemia. No easy bruising or bleeding.   PHYSICAL EXAMINATION:  VITAL SIGNS: On presentation, pulse 44, respirations 18, blood pressure 172/76, pulse oximetry 95% on room air. Current pulse when I saw him was ranging between 80 and 90.   GENERAL: No respiratory distress, lying flat in bed.   EYES: Conjunctivae and lids normal. Pupils equal, round, and reactive to light. Extraocular muscles intact. No nystagmus.   EARS, NOSE, MOUTH, AND THROAT: Tympanic membranes no erythema. Nasal mucosa no erythema. Throat no erythema. No exudate seen. Lips and gums no lesions.   NECK: No JVD. No bruits. No lymphadenopathy. No thyromegaly. No thyroid  nodules palpated.   LUNGS: Lungs are clear to auscultation. No use of accessory muscles to breathe. No rhonchi, rales, or wheeze heard.   HEART: S1, S2 normal. No gallops, rubs, or murmurs heard. Carotid upstroke 2+ bilaterally. No bruits.   EXTREMITIES: Dorsalis pedis pulses 2+ bilaterally. No edema of the lower extremities.   ABDOMEN: Soft and nontender. No organomegaly/splenomegaly. Normoactive bowel sounds. No masses felt.   LYMPHATIC: No lymph  nodes in the neck.   MUSCULOSKELETAL: No clubbing, edema, or cyanosis.   SKIN: Right back of the head three staples placed by the ER physician secondary to bleeding. Area still has some blood on it.   NEUROLOGIC: Cranial nerves II through XII grossly intact. Deep tendon reflexes 2+ bilateral lower extremities.   PSYCHIATRIC: The patient is oriented to person, place, and time.   LABORATORY AND RADIOLOGICAL DATA: CT scan of the head showed no acute intracranial abnormality. Chest x-ray: Hypoinflation, cardiomegaly, right base atelectasis. White blood cell count 6.1, hemoglobin and hematocrit 13.6 and 42.1, platelet count 123, glucose 118, BUN 19, creatinine 1.32, sodium 144, potassium 4.1, chloride 106, CO2 29, calcium 8.9. Troponin borderline at 0.06. BNP 244.  Two EKGs: One was done that showed marked sinus bradycardia, first-degree AV block, left axis deviation, right bundle branch block, left ventricular hypertrophy, QT 674, QTc 576.  EKG showed normal sinus rhythm, 79 beats per minute, first-degree AV block, left axis deviation, right bundle branch block, left ventricular hypertrophy.   ASSESSMENT AND PLAN:  1. Syncope, bradycardia, prolonged QT, borderline troponin: Case discussed with cardiology, Dr. Rockey Situ. We will watch on telemetry as an observation. Get serial cardiac enzymes, obtain an echocardiogram. We will also give IV fluid hydration and check orthostatic vitals.  Dr. Rockey Situ probably will speak with Dr. Caryl Comes to see whether or not a pacemaker would be indicated.  2. Laceration at the back of the head: The patient refused a tetanus injection. Keflex.  3. Acute renal failure: We will give IV fluid hydration.  4. Hypertension: Blood pressure currently stable off medications.  5. Hypothyroidism: Continue Armour Thyroid. Check a TSH in the a.m.  6. Low testosterone and high estrogen: The patient is on anastrozole and testosterone injections.  7. Thrombocytopenia: Unclear cause. We will  hold off on heparin products. Will check a CBC in the a.m.  8. Impaired fasting glucose: We will also check a hemoglobin A1c in the a.m.   TIME SPENT ON ADMISSION: 55 minutes.    ____________________________ Tana Conch. Leslye Peer, MD [id number] D: 01/19/2012 13:39:27 ET T: 01/19/2012 14:26:42 ET JOB#: [id number]  cc: Tana Conch. Leslye Peer, MD, <Dictator> Deboraha Sprang, MD Dory Horn. Eliberto Ivory, [place] MD ELECTRONICALLY SIGNED 01/19/2012 17:06

## 2015-02-12 NOTE — Consult Note (Signed)
General Aspect 69 year old man with a hx of prolonged QT interval,  h/o syncope and near syncope, who presents after a syncopal epsiode. Cardiology was consulted for syncope.  He was seen recently in the office 11/2011 by Dr. Caryl Comes for prolonged QT and syncope. Loop monitor was recommended. Today he was walking to church and after walking around 400 feet, He developed dizziness  had to hold onto a pole and then passed out. The next thing he knew he was in the ambulance. He hit the back of his head and required staples.  At the end of January he did pass out.He lifted a box out of of his trunk and carried it into the house. It weighed about 60 pounds. He put it on the counter and then fainted. He was out for some number of minutes. EMS was called. First vital signs are recorded as normal. He hurt his ribs at that time. He was previously on hydrochlorothiazide and spironolactone but last week he stopped all medications and has been monitoring his blood pressure on his own.   Last Sunday he also felt like he was going to faint but he did not faint.  In the Emergency Room he was found to be bradycardic requiring a dose of atropine IV for bradycardia, rates in the 40s.   PAST MEDICAL HISTORY:  1. History of prolonged QT, dx back in 2005 2. Hypertension.  3.           Syncope and Near syncope 4.           cardiac cath 2005 with no obstructive disease. Done for chest pain  PAST SURGICAL HISTORY: None.   ALLERGIES: No known drug allergies.   MEDICATIONS:  1. Testosterone injections twice a week. 2. Anastrozole 0.5 mg daily.  3. B complex daily.  4. Vitamin C daily.  5. Potassium supplementation daily.  6. Magnesium supplementation daily.  7. Herbal seropeptase?. 8. Armour Thyroid 60 mg daily.   SOCIAL HISTORY: Quit smoking 23 years ago. No alcohol. No drug use. Does desk work and computer work.   FAMILY HISTORY: Mother died at 10 of leukemia. Father died at 70 of kidney failure, also had long  QT interval.    Present Illness He has 2 children. Both of his sons has died. One of cancer and one for homan autopsy demonstrated cocaine and to which this was attributed. His brother and nephew were apparently evaluated and the diagnosis was unclear.   He has had a couple of other episodes of weakness. These have occurred in the setting of relative dehydration. The first occurred at the beach after he got badly sunburned in the setting of Claritin the other in the context of a low calorie diet.   His modest shortness of breath. He has some peripheral edema. He is being managed currently with potassium wasting diuretic taken in conjunction with triamterene. He also has gout.   Physical Exam:   GEN WD, WN, NAD, obese    HEENT red conjunctivae    NECK supple     RESP normal resp effort  clear BS     CARD Regular rate and rhythm  Murmur     Murmur Systolic     ABD soft     SKIN normal to palpation    NEURO cranial nerves intact, motor/sensory function intact    PSYCH alert, A+O to time, place, person, good insight   Review of Systems:   Subjective/Chief Complaint pass out spell  General: No Complaints     Skin: No Complaints     ENT: No Complaints     Eyes: No Complaints     Neck: No Complaints     Respiratory: No Complaints     Cardiovascular: Dyspnea     Gastrointestinal: No Complaints     Genitourinary: No Complaints     Vascular: No Complaints     Musculoskeletal: No Complaints     Neurologic: Fainting     Hematologic: No Complaints     Endocrine: No Complaints     Psychiatric: No Complaints     Review of Systems: All other systems were reviewed and found to be negative     Medications/Allergies Reviewed Medications/Allergies reviewed         Admit Diagnosis:   SYNCOPE: 19-Jan-2012, Active, SYNCOPE      Admit Reason:   Syncope: (780.2) Active, ICD9, Syncope and collapse    Routine Hem:  31-Mar-13 10:15    WBC (CBC) 6.1   RBC (CBC)  5.16   Hemoglobin (CBC) 13.6   Hematocrit (CBC) 42.0   Platelet Count (CBC) 123   MCV 82   MCH 26.4   MCHC 32.4   RDW 14.1  Routine Chem:  31-Mar-13 10:15    Glucose, Serum 118   BUN 19   Creatinine (comp) 1.32   Sodium, Serum 144   Potassium, Serum 4.1   Chloride, Serum 106   CO2, Serum 29   Calcium (Total), Serum 8.9   Anion Gap 9   Osmolality (calc) 290   eGFR (African American) >60   eGFR (Non-African American) 58  Cardiac:  31-Mar-13 10:15    Troponin I 0.06  Routine Chem:  31-Mar-13 10:15    B-Type Natriuretic Peptide ([facility]) 244  Routine Coag:  31-Mar-13 17:17    Activated PTT (APTT) 27.7   EKG:   Interpretation EKG shows sinus bradycardia with RBBB, LAFB, rate 44 bpm, marked T wave ABN in V3 to V6, II, III, and AVF.  Dynamic T wave ABN that resolved on follow up EKG   Radiology Results: XRay:    31-Mar-13 10:20, Chest Portable Single View   Chest Portable Single View    REASON FOR EXAM:    syncope  COMMENTS:       PROCEDURE: DXR - DXR PORTABLE CHEST SINGLE VIEW  - Jan 19 2012 10:20AM     RESULT: There is no previous exam for comparison.    The cardiac silhouette is mildly enlarged. Right lung base atelectasis is   present. Mild edema is a possibility. No significant effusion is seen.   Shallow inspiration is demonstrated which may accentuate the lung   markings.    IMPRESSION:  Hypoinflation with cardiomegaly and some prominence of the   lung markings versus slight pulmonary edema. Right lung base atelectasis   suspected.    Verified By: Sundra Aland, M.D., MD  Cardiology:    31-Mar-13 13:41, Echo Doppler   Echo Doppler    Interpretation Summary    Challenging image quality. Ejection Fraction = 35-45% based on   parasternal short axis images only. Left ventricular systolic   function is moderately reduced. The transmitral spectral Doppler   flow pattern is suggestiveof impaired LV relaxation. There is   severe inferior wall hypokinesis.  There is severe posterior wall   hypokinesis. The right ventricular systolic function is normal. The   left atrial size is normal. Right ventricular systolic pressure is   normal.    PatientHeight:  183 cm    PatientWeight: 127 kg    BSA: 2.5 m2    Procedure:    A two-dimensional transthoracic echocardiogram with color flow and   Doppler was performed.    The study was completed  in the emergency room.    TDS due to body Habitus.    Left Ventricle    There is severe inferior wall hypokinesis.    Ejection Fraction = 35-45%.    The transmitral spectral Doppler flow pattern is suggestive of   impaired LV relaxation.    There is severe posterior wall hypokinesis.    The left ventricle is normal in size.    There is mild concentric left ventricular hypertrophy.    Left ventricular systolic function is moderately reduced.    Right Ventricle    The right ventricle is normal size.    The right ventricular systolic function is normal.    Atria    The left atrial size is normal.    Right atrial size is normal.    There is no Doppler evidence for an atrial septal defect.    Mitral Valve    The mitral valve leaflets appear normal. There is no evidence of   stenosis, fluttering, or prolapse.    There is no mitral valve stenosis.    There is trace mitral regurgitation.    Tricuspid Valve    The tricuspid valve is not well visualized, but is grossly normal.    There is trace tricuspid regurgitation.    Right ventricular systolic pressure is normal.    Aortic Valve    The aortic valve opens well.    No hemodynamically significant valvular aortic stenosis.    Mild aortic regurgitation.    Pulmonic Valve    The pulmonic valve is not well visualized.    Trace pulmonic valvular regurgitation.    Great Vessels    The aortic root is not well visualized.    The pulmonary artery is not well visualized, but is probably normal   size.    Pericardium/Pleural    There is no pleural  effusion.    No pericardial effusion.    MMode 2DMeasurements and Calculations    IVSd: 1.8 cm    LVIDd: 4.6 cm    LVIDs: 2.8 cm    LVPWd: 1.6 cm    FS: 38 %    EF(Teich): 69 %    Ao root diam: 4.0 cm    ACS: 2.7 cm    LA dimension: 4.3 cm    LVOT diam: 2.3 cm    Doppler Measurements and Calculations    MV E point: 71 cm/sec    MV A point: 123 cm/sec    MV E/A: 0.58     LV IVRT: 0.06 sec    MV P1/2t max vel: 71 cm/sec    MV P1/2t: 71 msec    MVA(P1/2t): 3.1 cm2    MV dec slope: 291 cm/sec2    MV dec time: 0.24 sec    Ao V2 max: 165 cm/sec    [place] PG: 11 mmHg    Ao V2 mean: 101 cm/sec    Ao mean PG: 4.7 mmHg    Ao V2 VTI: 30 cm    AVA(I,D): 2.1 cm2    AVA(V,D): 2.1 cm2    AI max vel: 223 cm/sec    AI max PG: 20 mmHg    AI dec slope: 66 cm/sec2    AI P1/2t: 994 msec    LV  max PG: 3.0 mmHg   LV mean PG: 0.93 mmHg    LV V1 max: 83 cm/sec    LV V1 mean: 42 cm/sec    LV V1 VTI: 15 cm    MR max vel: 233 cm/sec    MR max PG: 22 mmHg    SV(LVOT): 62 ml    PA V2 max: 98 cm/sec    PA max PG: 4.0 mmHg    PA acc time: 0.13 sec    TR Max vel: 170 cm/sec    TR Max PG: 12 mmHg    RVSP: 17 mmHg    RAP systole: 5.0 mmHg    PA pr(Accel): 22 mmHg    Reading Physician: Ida Rogue   Sonographer: Jaci Standard  Interpreting Physician:  Ida Rogue,  electronically signed on   01-19-2012 18:37:47  Requesting Physician: Ida Rogue    No Known Allergies:   Vital Signs/Nurse's Notes: **Vital Signs.:   31-Mar-13 15:02   Vital Signs Type Admission   Temperature Temperature (F) 97.7   Celsius 36.5   Temperature Source oral   Pulse Pulse 72   Pulse source per Dinamap   Respirations Respirations 18   Systolic BP Systolic BP 194   Diastolic BP (mmHg) Diastolic BP (mmHg) 76   Pulse Lying Pulse Lying 73   Systolic BP Systolic BP 174   Diastolic BP (mmHg) Diastolic BP (mmHg) 78   Pulse Pulse Sitting 72   Systolic BP Systolic BP 081   Diastolic BP (mmHg)  Diastolic BP (mmHg) 88   Pulse Standing Pulse Standing 75   Pulse Ox % Pulse Ox % 98   Pulse Ox Activity Level  At rest   Oxygen Delivery 2L     Impression 69 year old man with a hx of prolonged QT interval,  h/o syncope and near syncope this year, who presents after a syncopal epsiode. Cardiology was consulted for syncope.  Dramatic T wave ABN on initial EKG with significant T wave inversions. T wave ABN resolved on follow up ekg. Initial sinus brady with rates in the 40s, he reports rates in teh 30s with symptoms in teh ER  He required atropine (x2?)  A/P: 1) Syncope Concerning for possible underlying arrhythmia Bradycardia on arrival and at the scene ECHO showing inferior wall hypokinesis (new?) Dramatic EKG ABN. TNT 0.18 --I have suggested to him that he needs a cardiac cath tomorrow --If cath shows no significant disease, he may need pacer --Would need to discuss with Dr. Caryl Comes and determine if pacer or ICD better given prolonged QT hx  2) Bradycardia Stable rate currently Can use atropine for bradycardia Suspect given 1 degress AV block, RBBB, LAFB with bradycardia, he will need pacer at least 30 day monitor at the very least  3) HTN: He has been holding his meds. Will monitor BP for now.   Electronic Signatures: Ida Rogue (MD)  (Signed 31-Mar-13 20:31)  Authored: General Aspect/Present Illness, History and Physical Exam, Review of System, Health Issues, Home Medications, Labs, EKG , Radiology, Allergies, Vital Signs/Nurse's Notes, Impression/Plan   Last Updated: 31-Mar-13 20:31 by Ida Rogue (MD)

## 2015-02-12 NOTE — Discharge Summary (Signed)
PATIENT NAME:  William Woods, William Woods MR#:  [id number] DATE OF BIRTH:  05-Sep-1946  DATE OF ADMISSION:  01/19/2012 DATE OF DISCHARGE:  01/20/2012  DISCHARGE DIAGNOSES:  1. Syncope and bradycardia along with prolonged QT syndrome and borderline troponin, likely due to supply/demand ischemia. Negative cardiac catheterization with ejection fraction of 40% outpatient. Will require outpatient cardiology follow up for possible automatic implantable cardiovascular defibrillator and pacer due to recurrent nature of syncope and prolonged QT syndrome.  2. Laceration at the back of the head. Patient refused tetanus injection and Keflex. The site was not infected.   3. Acute renal failure resolved with intravenous hydration, likely prerenal. 4. Hypertension, stable for now.  5. Hypothyroidism, on thyroid medication with low thyroid stimulating hormone and normal free T4.  6. Low testosterone and high estrogen.  7. Thrombocytopenia likely chronic.  8. Impaired fasting glucose with hemoglobin A1c of 6.1.   SECONDARY DIAGNOSES:  1. History of prolonged QT.  2. Hypertension.   CONSULTATIONS:  1. Physical therapy.  2. Cardiology.   LABORATORY, DIAGNOSTIC AND RADIOLOGICAL DATA: Cardiac catheterization on 04/01 by Dr. Fletcher Anon showed nonobstructive coronary disease. Ejection fraction of 40%.   CT scan of the head without contrast on 01/19/2012 showed no acute intracranial abnormality. Chronic small vessel ischemic disease. Chest x-ray on the 01/19/2012 showed hypoinflation with cardiomegaly. Right lung base atelectasis. 2-D echocardiogram on 01/19/2012 showed ejection fraction of 35% to 45%. Impaired LV relaxation. Moderately reduced LV systolic function. Severe inferior and posterior wall hypokinesis. Normal RV systolic function.   Serum free T3 was within normal limits.   HISTORY AND SHORT HOSPITAL COURSE: Patient is a 69 year old male with above-mentioned medical problems was admitted for syncope and bradycardia. He  was found to have prolonged QT syndrome along with borderline elevated troponin. Cardiology consult was obtained with Dr. Ida Rogue and subsequently Dr. Fletcher Anon. His elevated enzymes were thought to be due to supply/demand ischemia but considering his recurrent nature of syncope cardiac catheterization was recommended along with 2-D echo. Results have been dictated above. Catheterization was completely clean but considering his recurrent nature of syncope and prolonged QT syndrome Dr. Fletcher Anon recommended automatic implantable cardiac defibrillator placement. He discussed the case with Dr. Caryl Comes who recommended loop recorder placement before placement of an implantable cardiac defibrillator and patient was discharged home to get that procedure done as an outpatient. He was evaluated by physical therapy while in the hospital and was recommended discharge home.   PHYSICAL EXAMINATION: VITAL SIGNS: On the date of discharge his vital signs were as follows: Temperature 98.2, heart rate 66 per minute, respirations 16 per minute, blood pressure 138/82 mmHg. He was saturating 98% on room air. Pertinent Physical Examination: CARDIOVASCULAR: S1, S2 normal. No murmurs, rubs, or gallop. LUNGS: Clear to auscultation bilaterally. No wheezes, rales, rhonchi, crepitation. ABDOMEN: Soft, benign. NEUROLOGIC: Nonfocal examination. All other physical examination remained at the baseline.   DISCHARGE MEDICATIONS: No new medication; to be continued his home medications for now.   DISCHARGE DIET: Low sodium.   DISCHARGE ACTIVITY: As tolerated.   DISCHARGE INSTRUCTIONS AND FOLLOW UP: Patient was instructed to follow up with his primary care physician, Dr. Calla Kicks, in 1 to 2 weeks. He will need follow up with Dr. Virl Axe from [facility] on 04/02 as scheduled at 9:00 a.m. for planned procedure.       TOTAL TIME DISCHARGING THIS PATIENT: 55 minutes.  ____________________________ Lucina Mellow. Manuella Ghazi,  MD vss:cms D: 01/23/2012 21:48:57 ET T: 01/24/2012 11:26:37 ET JOB#: [id number]  cc: Ronnie Mallette S. Manuella Ghazi, MD, <Dictator> Dory Horn. Eliberto Ivory, MD Minna Merritts, MD Mertie Clause. Fletcher Anon, MD Deboraha Sprang, MD Lucina Mellow [facility] MD ELECTRONICALLY SIGNED 01/26/2012 20:00

## 2015-02-12 NOTE — Consult Note (Signed)
Brief Consult Note: Diagnosis: Syncope.   Comments: I discussed the case with Dr. Caryl Comes regarding the need for furthur EP evaluation and possible need for an ICD. Dr. Caryl Comes recommended a loop recorder placement for a diagnosis of arrhythmia before an ICD placement. The patient was scheduled for an outpatient loop recorder placement to be done tomorrow at 9 am at [facility]. This was discussed with patient and wife.  Electronic Signatures: Kathlyn Sacramento (MD)  (Signed 01-Apr-13 17:41)  Authored: Brief Consult Note   Last Updated: 01-Apr-13 17:41 by Kathlyn Sacramento (MD)

## 2015-03-30 ENCOUNTER — Ambulatory Visit (INDEPENDENT_AMBULATORY_CARE_PROVIDER_SITE_OTHER): Payer: Commercial Managed Care - HMO | Admitting: *Deleted

## 2015-03-30 DIAGNOSIS — I442 Atrioventricular block, complete: Secondary | ICD-10-CM | POA: Diagnosis not present

## 2015-03-30 NOTE — Progress Notes (Signed)
Remote pacemaker transmission.   

## 2015-04-02 LAB — CUP PACEART REMOTE DEVICE CHECK
Battery Impedance: 208 Ohm
Battery Remaining Longevity: 123 mo
Battery Voltage: 2.78 V
Brady Statistic AP VP Percent: 20 %
Brady Statistic AP VS Percent: 0 %
Brady Statistic AS VP Percent: 80 %
Brady Statistic AS VS Percent: 0 %
Date Time Interrogation Session: 20160609114537
Lead Channel Impedance Value: 409 Ohm
Lead Channel Impedance Value: 554 Ohm
Lead Channel Pacing Threshold Amplitude: 0.875 V
Lead Channel Pacing Threshold Amplitude: 1 V
Lead Channel Pacing Threshold Pulse Width: 0.4 ms
Lead Channel Pacing Threshold Pulse Width: 0.4 ms
Lead Channel Sensing Intrinsic Amplitude: 2.8 mV
Lead Channel Setting Pacing Amplitude: 1.75 V
Lead Channel Setting Pacing Amplitude: 2 V
Lead Channel Setting Pacing Pulse Width: 0.4 ms
Lead Channel Setting Sensing Sensitivity: 4 mV

## 2015-04-12 ENCOUNTER — Encounter: Payer: Self-pay | Admitting: Cardiology

## 2015-04-17 ENCOUNTER — Other Ambulatory Visit: Payer: Self-pay

## 2015-04-19 ENCOUNTER — Encounter: Payer: Self-pay | Admitting: Internal Medicine

## 2015-04-26 ENCOUNTER — Encounter: Payer: Self-pay | Admitting: Cardiology

## 2015-04-26 ENCOUNTER — Encounter: Payer: Self-pay | Admitting: Internal Medicine

## 2015-05-01 ENCOUNTER — Telehealth: Payer: Self-pay | Admitting: *Deleted

## 2015-05-01 MED ORDER — LOSARTAN POTASSIUM 100 MG PO TABS: 50.0000 mg | ORAL_TABLET | Freq: Two times a day (BID) | ORAL | Status: DC

## 2015-05-01 NOTE — Telephone Encounter (Signed)
Refill sent for Losartan 100 mg.  

## 2015-05-01 NOTE — Telephone Encounter (Signed)
  1. Which medications need to be refilled? Losartin 100mg   2. Which pharmacy is medication to be sent to? [facility] on [address]  3. Do they need a 30 day or 90 day supply? 90 day supply  4. Would they like a call back once the medication has been sent to the pharmacy? Yes

## 2015-05-31 ENCOUNTER — Ambulatory Visit (INDEPENDENT_AMBULATORY_CARE_PROVIDER_SITE_OTHER): Payer: Commercial Managed Care - HMO | Admitting: Family Medicine

## 2015-05-31 ENCOUNTER — Encounter: Payer: Self-pay | Admitting: Family Medicine

## 2015-05-31 VITALS — BP 130/80 | HR 82 | Temp 98.2°F | Resp 16 | Ht 72.0 in | Wt 301.0 lb

## 2015-05-31 DIAGNOSIS — Z950 Presence of cardiac pacemaker: Secondary | ICD-10-CM | POA: Diagnosis not present

## 2015-05-31 DIAGNOSIS — G4733 Obstructive sleep apnea (adult) (pediatric): Secondary | ICD-10-CM | POA: Diagnosis not present

## 2015-05-31 DIAGNOSIS — L989 Disorder of the skin and subcutaneous tissue, unspecified: Secondary | ICD-10-CM

## 2015-05-31 DIAGNOSIS — I442 Atrioventricular block, complete: Secondary | ICD-10-CM

## 2015-05-31 DIAGNOSIS — I4581 Long QT syndrome: Secondary | ICD-10-CM

## 2015-05-31 DIAGNOSIS — I10 Essential (primary) hypertension: Secondary | ICD-10-CM | POA: Diagnosis not present

## 2015-05-31 DIAGNOSIS — M1000 Idiopathic gout, unspecified site: Secondary | ICD-10-CM

## 2015-05-31 NOTE — Progress Notes (Signed)
Name: William Woods   MRN: [id number]    DOB: 09/09/1946   Date:05/31/2015       Progress Note  Subjective  Chief Complaint  Chief Complaint  Patient presents with  . Establish Care    Pt needs to establish care for [facility] Referrals to pulmonary, dermatology, and cardiology.    HPI  [name] to establish care with me.  Had seen Dr. Ellene Route >1 yr ago.  Sees Dr. Jens Som re: long QT syndrome.  Has inplanted pacemaker.  Uses CPAP and needs pul;monary referral for [id number] for CPAP.  Needs Derm referral for skin lesion on face.  Feeling ok.  Taking Lasix 3 days a week.  Takes Losartan 1/2 bid. He has stated a diet and has lost 12# in past 1-2 mos.    Has not seen primary care in  16 mos.  Hx. Of gout in past.   No problem-specific assessment & plan notes found for this encounter.   Past Medical History  Diagnosis Date  . Long Q-T syndrome   . Hypertension   . Thyroid disease   . Obesity (BMI 30-39.9)   . Obstructive sleep apnea   . Edema     Suspect diastolic heart failure  . Syncope   . CHB (complete heart block)   . Pacemaker     Medtronic Adapta L model ADDRL 1 (serial number [id number]) pacemaker.    Past Surgical History  Procedure Laterality Date  . Pacemaker insertion  02/04/2012    Medtronic Adapta L model ADDRL 1 (serial number [id number]) pacemaker.  . Other surgical history  02/04/2012     removal of a previously implanted MDT Reveal XT implantable loop recorder  . Cardiac catheterization      [facility]   . Cardiac catheterization  01/2012    [facility]: no significant CAD, EF 40%.   . Loop recorder implant N/A 01/21/2012    Procedure: LOOP RECORDER IMPLANT;  Surgeon: Deboraha Sprang, MD;  Location: [facility] CATH LAB;  Service: Cardiovascular;  Laterality: N/A;  . Permanent pacemaker insertion N/A 02/04/2012    Procedure: PERMANENT PACEMAKER INSERTION;  Surgeon: Thompson Grayer, MD;  Location: [facility] CATH LAB;  Service: Cardiovascular;  Laterality: N/A;  . Loop recorder explant N/A  02/04/2012    Procedure: LOOP RECORDER EXPLANT;  Surgeon: Thompson Grayer, MD;  Location: [facility] CATH LAB;  Service: Cardiovascular;  Laterality: N/A;    Family History  Problem Relation Age of Onset  . Kidney disease Father     long QT disease  . Cancer Mother     leukemia  . Bone cancer Son     deceased    Social History   Social History  . Marital Status: Married    Spouse Name: N/A  . Number of Children: N/A  . Years of Education: N/A   Occupational History  . Service and [employer]    Social History Main Topics  . Smoking status: Former Smoker -- 4.00 packs/day for 25 years    Types: Cigarettes    Quit date: 02/06/1988  . Smokeless tobacco: Never Used  . Alcohol Use: 0.0 oz/week    0 Standard drinks or equivalent per week     Comment: occassional--special occassions  . Drug Use: No  . Sexual Activity: Not on file   Other Topics Concern  . Not on file   Social History Narrative   Lives in [place], with wife. No children, 2 sons deceased. No pets.  Pt owns [employer]. Quit tobacco at age 39. He lives with his wife. Pt is not active..the patient father had a history of long QT and syncope and died at age 54. His mother died at age 38 of cancer.      Current outpatient prescriptions:  .  furosemide (LASIX) 40 MG tablet, TAKE ONE TABLET BY MOUTH 3 DAYS PER WEEK., Disp: 90 tablet, Rfl: 3 .  losartan (COZAAR) 100 MG tablet, Take 0.5 tablets (50 mg total) by mouth 2 (two) times daily., Disp: 90 tablet, Rfl: 3  Allergies  Allergen Reactions  . Other     LONG QT SYNDROME--MANY DRUGS PATIENT HAS TO AVOID SEE WEBSITE FOR DETAILS Http://www.sads.org.uk/drugs_to_avoid.htm      Review of Systems  Constitutional: Positive for weight loss (desired). Negative for fever, chills and malaise/fatigue.  HENT: Negative for congestion and hearing loss.   Eyes: Negative for blurred vision and double vision.  Respiratory: Negative for cough, sputum  production, shortness of breath and wheezing.   Cardiovascular: Negative for chest pain, palpitations, orthopnea and leg swelling.  Gastrointestinal: Negative for heartburn, nausea, vomiting, abdominal pain, diarrhea and blood in stool.  Genitourinary: Negative for dysuria, urgency and frequency.  Musculoskeletal: Negative for myalgias and joint pain.  Skin: Negative for rash.  Neurological: Negative for dizziness, tingling, tremors, sensory change, focal weakness, weakness and headaches.  Psychiatric/Behavioral: Negative for depression. The patient is not nervous/anxious.       Objective  Filed Vitals:   05/31/15 1433 05/31/15 1531  BP: 100/66 130/80  Pulse: 82   Temp: 98.2 F (36.8 C)   TempSrc: Oral   Resp: 16   Height: 6' (1.829 m)   Weight: 301 lb (136.533 kg)     Physical Exam  Constitutional: He is oriented to person, place, and time and well-developed, well-nourished, and in no distress. No distress.  Morbidly obese.  HENT:  Head: Normocephalic and atraumatic.  Eyes: Conjunctivae and EOM are normal. Pupils are equal, round, and reactive to light.  Fundoscopic exam:      The right eye shows no arteriolar narrowing, no AV nicking, no hemorrhage and no papilledema.       The left eye shows no arteriolar narrowing, no AV nicking, no hemorrhage and no papilledema.  Neck: Normal range of motion. Neck supple. Carotid bruit is not present. No thyromegaly present.  Cardiovascular: Normal rate, normal heart sounds and intact distal pulses.  Exam reveals no gallop and no friction rub.   No murmur heard. Pulmonary/Chest: Effort normal and breath sounds normal. No respiratory distress. He has no wheezes. He has no rales.  Abdominal: Soft. Bowel sounds are normal. He exhibits no distension and no mass. There is no tenderness.  Musculoskeletal: He exhibits edema (1+ pedal edema bilaterally).  Lymphadenopathy:    He has no cervical adenopathy.  Neurological: He is alert and  oriented to person, place, and time. No cranial nerve deficit.  Skin: Skin is warm and dry.  Seborrheic keratosis L. cheek  Vitals reviewed.      Recent Results (from the past 2160 hour(s))  Implantable device - remote     Status: None   Collection Time: 03/30/15 11:45 AM  Result Value Ref Range   Date Time Interrogation Session [id number]    Pulse Generator Manufacturer MERM    Pulse Gen Model ADDRL1 Adapta    Pulse Gen Serial Number [id number]    Implantable Pulse Generator Type Implantable Pulse Generator    Implantable Pulse Generator Implant Date  [id number]    Lead Channel Setting Sensing Sensitivity 4 mV   Lead Channel Setting Pacing Amplitude 1.75 V   Lead Channel Setting Pacing Pulse Width 0.4 ms   Lead Channel Setting Pacing Amplitude 2 V   Lead Channel Impedance Value 409 ohm   Lead Channel Sensing Intrinsic Amplitude 2.8 mV   Lead Channel Pacing Threshold Amplitude 0.875 V   Lead Channel Pacing Threshold Pulse Width 0.4 ms   Lead Channel Impedance Value 554 ohm   Lead Channel Pacing Threshold Amplitude 1.0 V   Lead Channel Pacing Threshold Pulse Width 0.4 ms   Battery Status Unknown    Battery Remaining Longevity 123 mo   Battery Voltage 2.78 V   Battery Impedance 208 ohm   Brady Statistic AP VP Percent 20 %   Brady Statistic AS VP Percent 80 %   Brady Statistic AP VS Percent 0 %   Brady Statistic AS VS Percent 0 %   Eval Rhythm AsVp      Assessment & Plan  Problem List Items Addressed This Visit      Cardiovascular and Mediastinum   Long Q-T syndrome - Primary   Relevant Orders   Ambulatory referral to Cardiology   Hypertension   Relevant Orders   Comprehensive Metabolic Panel (CMET)   Complete heart block     Respiratory   OSA (obstructive sleep apnea)     Other   Morbid obesity   Relevant Orders   Lipid Profile   TSH   Pacemaker-MDT   Relevant Orders   CBC with Differential   Gout   Relevant Orders   Uric acid    Other  Visit Diagnoses    Skin lesion of face        Relevant Orders    Ambulatory referral to Dermatology    Sleep apnea, obstructive        Relevant Orders    Ambulatory referral to Pulmonology       No orders of the defined types were placed in this encounter.   1. Long Q-T syndrome  - Ambulatory referral to Cardiology  2. Essential hypertension  - Comprehensive Metabolic Panel (CMET)  3. Complete heart block   4. OSA (obstructive sleep apnea)   5. Morbid obesity  - Lipid Profile - TSH  6. Pacemaker-MDT  - CBC with Differential  7. Idiopathic gout, unspecified chronicity, unspecified site  - Uric acid  8. Skin lesion of face  - Ambulatory referral to Dermatology  9. Sleep apnea, obstructive  - Ambulatory referral to Pulmonology

## 2015-05-31 NOTE — Patient Instructions (Signed)
Cont. His current meds

## 2015-06-05 LAB — CBC WITH DIFFERENTIAL/PLATELET
Basophils Absolute: 0 10*3/uL (ref 0.0–0.2)
Basos: 0 %
EOS (ABSOLUTE): 0.1 10*3/uL (ref 0.0–0.4)
Eos: 3 %
Hematocrit: 43.6 % (ref 37.5–51.0)
Hemoglobin: 14.9 g/dL (ref 12.6–17.7)
Immature Grans (Abs): 0 10*3/uL (ref 0.0–0.1)
Immature Granulocytes: 0 %
Lymphocytes Absolute: 1.4 10*3/uL (ref 0.7–3.1)
Lymphs: 28 %
MCH: 26.9 pg (ref 26.6–33.0)
MCHC: 34.2 g/dL (ref 31.5–35.7)
MCV: 79 fL (ref 79–97)
Monocytes Absolute: 0.4 10*3/uL (ref 0.1–0.9)
Monocytes: 9 %
Neutrophils Absolute: 2.9 10*3/uL (ref 1.4–7.0)
Neutrophils: 60 %
Platelets: 121 10*3/uL — ABNORMAL LOW (ref 150–379)
RBC: 5.53 x10E6/uL (ref 4.14–5.80)
RDW: 13.8 % (ref 12.3–15.4)
WBC: 4.9 10*3/uL (ref 3.4–10.8)

## 2015-06-06 LAB — COMPREHENSIVE METABOLIC PANEL
ALT: 23 IU/L (ref 0–44)
AST: 22 IU/L (ref 0–40)
Albumin/Globulin Ratio: 1.3 (ref 1.1–2.5)
Albumin: 3.8 g/dL (ref 3.6–4.8)
Alkaline Phosphatase: 78 IU/L (ref 39–117)
BUN/Creatinine Ratio: 12 (ref 10–22)
BUN: 13 mg/dL (ref 8–27)
Bilirubin Total: 0.6 mg/dL (ref 0.0–1.2)
CO2: 21 mmol/L (ref 18–29)
Calcium: 9 mg/dL (ref 8.6–10.2)
Chloride: 101 mmol/L (ref 97–108)
Creatinine, Ser: 1.12 mg/dL (ref 0.76–1.27)
GFR calc Af Amer: 78 mL/min/{1.73_m2} (ref >59)
GFR calc non Af Amer: 68 mL/min/{1.73_m2} (ref >59)
Globulin, Total: 2.9 g/dL (ref 1.5–4.5)
Glucose: 103 mg/dL — ABNORMAL HIGH (ref 65–99)
Potassium: 4.8 mmol/L (ref 3.5–5.2)
Sodium: 143 mmol/L (ref 134–144)
Total Protein: 6.7 g/dL (ref 6.0–8.5)

## 2015-06-06 LAB — LIPID PANEL
Chol/HDL Ratio: 4.9 ratio units (ref 0.0–5.0)
Cholesterol, Total: 180 mg/dL (ref 100–199)
HDL: 37 mg/dL — ABNORMAL LOW (ref >39)
LDL Calculated: 117 mg/dL — ABNORMAL HIGH (ref 0–99)
Triglycerides: 130 mg/dL (ref 0–149)
VLDL Cholesterol Cal: 26 mg/dL (ref 5–40)

## 2015-06-06 LAB — URIC ACID: Uric Acid: 7.8 mg/dL (ref 3.7–8.6)

## 2015-06-06 LAB — TSH: TSH: 2.31 u[IU]/mL (ref 0.450–4.500)

## 2015-06-09 ENCOUNTER — Telehealth: Payer: Self-pay | Admitting: *Deleted

## 2015-06-09 MED ORDER — LOSARTAN POTASSIUM 100 MG PO TABS: 50.0000 mg | ORAL_TABLET | Freq: Two times a day (BID) | ORAL | Status: DC

## 2015-06-09 NOTE — Telephone Encounter (Signed)
Refill sent for losartan 90 day supply

## 2015-06-09 NOTE — Telephone Encounter (Signed)
  1. Which medications need to be refilled? Lorsartin 50 mg x2,  2. Which pharmacy is medication to be sent to? [facility]  3. Do they need a 30 day or 90 day supply? 90 day  4. Would they like a call back once the medication has been sent to the pharmacy? no

## 2015-06-12 ENCOUNTER — Other Ambulatory Visit: Payer: Self-pay | Admitting: *Deleted

## 2015-06-12 MED ORDER — LOSARTAN POTASSIUM 100 MG PO TABS: 50.0000 mg | ORAL_TABLET | Freq: Two times a day (BID) | ORAL | Status: DC

## 2015-06-21 ENCOUNTER — Ambulatory Visit: Payer: Medicare Other | Admitting: Pulmonary Disease

## 2015-06-27 ENCOUNTER — Other Ambulatory Visit: Payer: Self-pay | Admitting: *Deleted

## 2015-06-27 MED ORDER — LOSARTAN POTASSIUM 100 MG PO TABS: 50.0000 mg | ORAL_TABLET | Freq: Two times a day (BID) | ORAL | Status: DC

## 2015-06-28 ENCOUNTER — Ambulatory Visit (INDEPENDENT_AMBULATORY_CARE_PROVIDER_SITE_OTHER): Payer: Commercial Managed Care - HMO | Admitting: Pulmonary Disease

## 2015-06-28 ENCOUNTER — Encounter: Payer: Self-pay | Admitting: Pulmonary Disease

## 2015-06-28 VITALS — BP 144/92 | HR 67 | Ht 72.0 in | Wt 296.0 lb

## 2015-06-28 DIAGNOSIS — G4733 Obstructive sleep apnea (adult) (pediatric): Secondary | ICD-10-CM | POA: Diagnosis not present

## 2015-06-28 NOTE — Patient Instructions (Signed)
Continue the excellent work Follow up in 6 months or as needed

## 2015-06-28 NOTE — Progress Notes (Signed)
Routine follow up for OSA Previously followed by Dr Gwenette Greet in [place] OSA is well-controlled. He is compliant with CPAP He has worked on weight loss and has lost 20 pounds over last several months  Filed Vitals:   06/28/15 1136  BP: 144/92  Pulse: 67  Height: 6' (1.829 m)  Weight: 134.265 kg (296 lb)  SpO2: 94%   NAD HEENT WNL No JVD Chest clear Reg, no M Obese, soft, +BS No LE edema  IMPRESSION: Obesity OSA - well controlled  PLAN: Cont CPAP @ current settings He is changing CPAP suppliers - we will help him establish locally I have congratulated him on the success with wt loss and encouraged that he cont the good work F/U in 6 months or sooner as needed  William Border, MD PCCM service Mobile [phone] Pager [phone]

## 2015-06-30 ENCOUNTER — Ambulatory Visit: Payer: Commercial Managed Care - HMO | Admitting: Pulmonary Disease

## 2015-07-03 ENCOUNTER — Encounter: Payer: Self-pay | Admitting: Internal Medicine

## 2015-07-03 ENCOUNTER — Ambulatory Visit (INDEPENDENT_AMBULATORY_CARE_PROVIDER_SITE_OTHER): Payer: Commercial Managed Care - HMO | Admitting: *Deleted

## 2015-07-03 DIAGNOSIS — I442 Atrioventricular block, complete: Secondary | ICD-10-CM | POA: Diagnosis not present

## 2015-07-03 NOTE — Progress Notes (Signed)
Remote pacemaker transmission.   

## 2015-07-13 LAB — CUP PACEART REMOTE DEVICE CHECK
Battery Impedance: 233 Ohm
Battery Remaining Longevity: 117 mo
Battery Voltage: 2.79 V
Brady Statistic AP VP Percent: 26 %
Brady Statistic AP VS Percent: 0 %
Brady Statistic AS VP Percent: 74 %
Brady Statistic AS VS Percent: 0 %
Date Time Interrogation Session: 20160912095349
Lead Channel Impedance Value: 387 Ohm
Lead Channel Impedance Value: 538 Ohm
Lead Channel Pacing Threshold Amplitude: 0.875 V
Lead Channel Pacing Threshold Amplitude: 1 V
Lead Channel Pacing Threshold Pulse Width: 0.4 ms
Lead Channel Pacing Threshold Pulse Width: 0.4 ms
Lead Channel Sensing Intrinsic Amplitude: 2.8 mV
Lead Channel Setting Pacing Amplitude: 1.75 V
Lead Channel Setting Pacing Amplitude: 2 V
Lead Channel Setting Pacing Pulse Width: 0.4 ms
Lead Channel Setting Sensing Sensitivity: 4 mV

## 2015-07-27 ENCOUNTER — Ambulatory Visit (INDEPENDENT_AMBULATORY_CARE_PROVIDER_SITE_OTHER): Payer: Self-pay | Admitting: Family Medicine

## 2015-07-27 ENCOUNTER — Telehealth: Payer: Self-pay | Admitting: Family Medicine

## 2015-07-27 ENCOUNTER — Encounter: Payer: Self-pay | Admitting: Family Medicine

## 2015-07-27 VITALS — BP 134/84 | HR 72 | Temp 98.4°F | Resp 16 | Ht 72.0 in | Wt 296.0 lb

## 2015-07-27 DIAGNOSIS — M1000 Idiopathic gout, unspecified site: Secondary | ICD-10-CM

## 2015-07-27 DIAGNOSIS — R739 Hyperglycemia, unspecified: Secondary | ICD-10-CM

## 2015-07-27 DIAGNOSIS — I10 Essential (primary) hypertension: Secondary | ICD-10-CM

## 2015-07-27 DIAGNOSIS — G4733 Obstructive sleep apnea (adult) (pediatric): Secondary | ICD-10-CM

## 2015-07-27 DIAGNOSIS — H6123 Impacted cerumen, bilateral: Secondary | ICD-10-CM

## 2015-07-27 LAB — POCT GLYCOSYLATED HEMOGLOBIN (HGB A1C): Hemoglobin A1C: 5.4

## 2015-07-27 NOTE — Telephone Encounter (Signed)
Requesting prednisone for gout flares, says allergic to multiple meds and prednisone helps.

## 2015-07-27 NOTE — Progress Notes (Signed)
Date:  07/27/2015   Name:  William Woods   DOB:  23-Jul-1946   MRN:  [id number]  PCP:  Dicky Doe, MD    Chief Complaint: Ear Pain   History of Present Illness:  This is a 69 y.o. male c/o B ears clogged, gets ears flushed yearly. Hx gout, had blood work in August that showed normal uric acid but mild hyperglycemia and HLD. Has OSA followed by pulmonary, also sees cardiology for pacemaker. Weight down 5# since August. Wants rx for prednisone for gout flares.  Review of Systems:  Review of Systems  Constitutional: Negative for fever and chills.  HENT: Negative for ear discharge, rhinorrhea, sore throat and trouble swallowing.   Respiratory: Negative for shortness of breath.   Cardiovascular: Negative for chest pain and leg swelling.    Patient Active Problem List   Diagnosis Date Noted  . OSA (obstructive sleep apnea) 11/11/2013  . Urinary hesitancy 08/03/2012  . Morbid obesity ([place]) 05/12/2012  . Pacemaker-MDT 05/12/2012  . Gout 04/30/2012  . Complete heart block ([place]) 02/04/2012  . Syncope   . Long Q-T syndrome   . Hypertension     Prior to Admission medications   Medication Sig Start Date End Date Taking? Authorizing Provider  furosemide (LASIX) 40 MG tablet TAKE ONE TABLET BY MOUTH 3 DAYS PER WEEK. 09/27/14   Deboraha Sprang, MD  losartan (COZAAR) 100 MG tablet Take 0.5 tablets (50 mg total) by mouth 2 (two) times daily. 06/27/15   Deboraha Sprang, MD    Allergies  Allergen Reactions  . Other     LONG QT SYNDROME--MANY DRUGS PATIENT HAS TO AVOID SEE WEBSITE FOR DETAILS Http://www.sads.org.uk/drugs_to_avoid.htm     Past Surgical History  Procedure Laterality Date  . Pacemaker insertion  02/04/2012    Medtronic Adapta L model ADDRL 1 (serial number [id number] [id number]) pacemaker.  . Other surgical history  02/04/2012     removal of a previously implanted MDT Reveal XT implantable loop recorder  . Cardiac catheterization      [facility]   . Cardiac catheterization  01/2012     [facility]: no significant CAD, EF 40%.   . Loop recorder implant N/A 01/21/2012    Procedure: LOOP RECORDER IMPLANT;  Surgeon: Deboraha Sprang, MD;  Location: [facility] CATH LAB;  Service: Cardiovascular;  Laterality: N/A;  . Permanent pacemaker insertion N/A 02/04/2012    Procedure: PERMANENT PACEMAKER INSERTION;  Surgeon: Thompson Grayer, MD;  Location: [facility] CATH LAB;  Service: Cardiovascular;  Laterality: N/A;  . Loop recorder explant N/A 02/04/2012    Procedure: LOOP RECORDER EXPLANT;  Surgeon: Thompson Grayer, MD;  Location: [facility] CATH LAB;  Service: Cardiovascular;  Laterality: N/A;    Social History  Substance Use Topics  . Smoking status: Former Smoker -- 4.00 packs/day for 25 years    Types: Cigarettes    Quit date: 02/06/1988  . Smokeless tobacco: Never Used  . Alcohol Use: 0.0 oz/week    0 Standard drinks or equivalent per week     Comment: occassional--special occassions    Family History  Problem Relation Age of Onset  . Kidney disease Father     long QT disease  . Cancer Mother     leukemia  . Bone cancer Son     deceased    Medication list has been reviewed and updated.  Physical Examination: BP 134/84 mmHg  Pulse 72  Temp(Src) 98.4 F (36.9 C) (Oral)  Resp 16  Ht 6' (1.829 m)  Wt 296 lb (134.265 kg)  BMI 40.14 kg/m2  Physical Exam  Constitutional: He appears well-developed and well-nourished.  HENT:  Cerumen R>L  Cardiovascular: Normal rate, regular rhythm and normal heart sounds.   Pulmonary/Chest: Effort normal and breath sounds normal.  Musculoskeletal:  1+ B pedal edema  Neurological: He is alert.  Skin: Skin is warm and dry.  Psychiatric: He has a normal mood and affect. His behavior is normal.    Assessment and Plan:  1. Cerumen impaction, bilateral Flushed to clear  2. Essential hypertension Well controlled, continue current regimen  3. Hyperglycemia a1c 5.4% - POCT HgB A1C  4. OSA (obstructive sleep apnea) Followed by pulmonary  5. Morbid obesity,  unspecified obesity type ([place]) Continue weight loss  6. Idiopathic gout, unspecified chronicity, unspecified site Uric acid normal, call prn flare, will message Dr. Luan Pulling regarding prednisone  Return in about 3 months (around 10/27/2015).  [name]. [name], [facility]  07/27/2015

## 2015-07-28 NOTE — Telephone Encounter (Signed)
I have checked with Dr. Esmond Plants, Cardiologist with [facility].  He agrees that colchicine is as safe or safer than Prednisone for acute flair.  I would recommend Colcrys, 0.6 mg., 2 stat then 1 in 1 hour and then 1 daily foa any acute gout flairs.

## 2015-07-28 NOTE — Telephone Encounter (Signed)
Relayed message to patient and gave him the correct spelling. He will look into and make sure he has no reaction, then contact office.

## 2015-07-28 NOTE — Telephone Encounter (Signed)
Called and busy twice. Please call this one back later.

## 2015-07-28 NOTE — Telephone Encounter (Signed)
Left msg at patient's job to back.

## 2015-08-01 ENCOUNTER — Encounter: Payer: Self-pay | Admitting: Cardiology

## 2015-08-15 ENCOUNTER — Encounter: Payer: Self-pay | Admitting: Cardiology

## 2015-09-26 ENCOUNTER — Encounter: Payer: Self-pay | Admitting: Internal Medicine

## 2015-10-09 ENCOUNTER — Other Ambulatory Visit: Payer: Self-pay | Admitting: Internal Medicine

## 2015-11-23 DIAGNOSIS — D2239 Melanocytic nevi of other parts of face: Secondary | ICD-10-CM | POA: Diagnosis not present

## 2015-11-23 DIAGNOSIS — D225 Melanocytic nevi of trunk: Secondary | ICD-10-CM | POA: Diagnosis not present

## 2015-12-04 NOTE — Progress Notes (Signed)
Patient Care Team: Arlis Porta., MD as PCP - General (Family Medicine)   HPI  William Woods is a 69 y.o. male Seen in followup for pacemaker implanted for symptomatic intermittent complete heart block April 2013. He had a history of syncope with a prolonged QT interval and he had undergone loop recorder insertion. With recurrent syncope heart block was identified.   Also he was treated with beta blockers for hypertension and long QT. He has stopped this and his now on losartan   He has been  diagnosed with Sleep apnea and has been improved with CPAP  His BP has been much better since the control of his sleep apnea   however, he does not often use his CPAP when he falls asleep in the chair in the evenings.  Edema is better. Dietary salt intake is high.   Last year he  told me of the death of his two sons, one from cancer and one from cocaine overdose  And Gods calling him back via [employer] so [employer] that he Gave      Past Medical History  Diagnosis Date  . Long Q-T syndrome   . Hypertension   . Thyroid disease   . Obesity (BMI 30-39.9)   . Obstructive sleep apnea   . Edema     Suspect diastolic heart failure  . Syncope   . CHB (complete heart block) ([place])   . Pacemaker     Medtronic Adapta L model ADDRL 1 (serial number NWE [id number]) pacemaker.    Past Surgical History  Procedure Laterality Date  . Pacemaker insertion  02/04/2012    Medtronic Adapta L model ADDRL 1 (serial number NWE [id number]) pacemaker.  . Other surgical history  02/04/2012     removal of a previously implanted MDT Reveal XT implantable loop recorder  . Cardiac catheterization      [facility]   . Cardiac catheterization  01/2012    [facility]: no significant CAD, EF 40%.   . Loop recorder implant N/A 01/21/2012    Procedure: LOOP RECORDER IMPLANT;  Surgeon: Deboraha Sprang, MD;  Location: [facility] CATH LAB;  Service: Cardiovascular;  Laterality: N/A;  . Permanent pacemaker insertion N/A 02/04/2012    Procedure:  PERMANENT PACEMAKER INSERTION;  Surgeon: Thompson Grayer, MD;  Location: [facility] CATH LAB;  Service: Cardiovascular;  Laterality: N/A;  . Loop recorder explant N/A 02/04/2012    Procedure: LOOP RECORDER EXPLANT;  Surgeon: Thompson Grayer, MD;  Location: [facility] CATH LAB;  Service: Cardiovascular;  Laterality: N/A;    Current Outpatient Prescriptions  Medication Sig Dispense Refill  . losartan (COZAAR) 50 MG tablet Take 50 mg by mouth 2 (two) times daily.    . furosemide (LASIX) 40 MG tablet TAKE ONE TABLET BY MOUTH 3 DAYS PER WEEK 90 tablet 0   No current facility-administered medications for this visit.    Allergies  Allergen Reactions  . Other     LONG QT SYNDROME--MANY DRUGS PATIENT HAS TO AVOID SEE WEBSITE FOR DETAILS Http://www.sads.org.uk/drugs_to_avoid.htm     Review of Systems negative except from HPI and PMH  Physical Exam BP 120/80 mmHg  Pulse 71  Ht 6' (1.829 m)  Wt 300 lb (136.079 kg)  BMI 40.68 kg/m2 Well developed and well nourished in no acute distress HENT normal E scleral and icterus clear Neck Supple JVP flat; carotids brisk and full Clear to ausculation Device pocket well healed; without hematoma or erythema.  There is no tethering  *Regular  rate and rhythm, no murmurs gallops or rub Soft with active bowel sounds No clubbing cyanosis  Tr  Edema Alert and oriented, grossly normal motor and sensory function Skin Warm and Dry  ECG  P-synchronous/ AV  Pacing    Assessment and  Plan  Complete Heart block stable  Pacemaker- Medtronic  The patient's device was interrogated.  The information was reviewed. No changes were made in the programming.    OSA  Much improved  Continue therapy  HFpEF--volume overload chronic    HTN  Atrial fibrillation  He is noted to have paroxysmal atrial fibrillation the longest of which has been 4 hours. We have discussed its implications and the potential need for anticoagulation. At his young age we will use a threshold of probably  6 hours.  This seems to be temporally related to his falling asleep in his chair after work wherein he does not use his CPAP. He will try and be more compliant with this.   His blood pressure is elevated. He finds that this is true in the afternoons in the mornings. We will have him take his losartan in the morning and try amlodipine 2.5 at bedtime.

## 2015-12-05 ENCOUNTER — Encounter: Payer: Self-pay | Admitting: Internal Medicine

## 2015-12-05 ENCOUNTER — Ambulatory Visit (INDEPENDENT_AMBULATORY_CARE_PROVIDER_SITE_OTHER): Payer: Medicare Other | Admitting: Internal Medicine

## 2015-12-05 VITALS — BP 120/80 | HR 71 | Ht 72.0 in | Wt 300.0 lb

## 2015-12-05 DIAGNOSIS — Z950 Presence of cardiac pacemaker: Secondary | ICD-10-CM | POA: Diagnosis not present

## 2015-12-05 DIAGNOSIS — I442 Atrioventricular block, complete: Secondary | ICD-10-CM | POA: Diagnosis not present

## 2015-12-05 DIAGNOSIS — I10 Essential (primary) hypertension: Secondary | ICD-10-CM

## 2015-12-05 MED ORDER — LOSARTAN POTASSIUM 100 MG PO TABS: ORAL_TABLET | ORAL | Status: DC

## 2015-12-05 MED ORDER — AMLODIPINE BESYLATE 2.5 MG PO TABS: ORAL_TABLET | ORAL | Status: DC

## 2015-12-05 NOTE — Patient Instructions (Signed)
Medication Instructions: 1) Take Cozaar (Losartan) 100 mg one tablet daily every morning 2) Start Norvasc (Amlodipine) 2.5 mg one tablet by daily at bedtime  Labwork: - none  Procedures/Testing: - none  Follow-Up: - Remote monitoring is used to monitor your Pacemaker of ICD from home. This monitoring reduces the number of office visits required to check your device to one time per year. It allows [place] to keep an eye on the functioning of your device to ensure it is working properly. You are scheduled for a device check from home on 03/05/16. You may send your transmission at any time that day. If you have a wireless device, the transmission will be sent automatically. After your physician reviews your transmission, you will receive a postcard with your next transmission date.  - Your physician wants you to follow-up in: 1 year with Dr. Caryl Comes. You will receive a reminder letter in the mail two months in advance. If you don't receive a letter, please call our office to schedule the follow-up appointment.   Any Additional Special Instructions Will Be Listed Below (If Applicable).     If you need a refill on your cardiac medications before your next appointment, please call your pharmacy.

## 2015-12-06 LAB — CUP PACEART INCLINIC DEVICE CHECK
Battery Impedance: 257 Ohm
Battery Remaining Longevity: 113 mo
Battery Voltage: 2.78 V
Brady Statistic AP VP Percent: 28 %
Brady Statistic AP VS Percent: 0 %
Brady Statistic AS VP Percent: 72 %
Brady Statistic AS VS Percent: 0 %
Date Time Interrogation Session: 20170214173728
Implantable Lead Implant Date: 20130416
Implantable Lead Implant Date: 20130416
Implantable Lead Location: 753859
Implantable Lead Location: 753860
Implantable Lead Model: 5076
Implantable Lead Model: 5076
Lead Channel Impedance Value: 387 Ohm
Lead Channel Impedance Value: 492 Ohm
Lead Channel Pacing Threshold Amplitude: 0.75 V
Lead Channel Pacing Threshold Amplitude: 0.75 V
Lead Channel Pacing Threshold Pulse Width: 0.4 ms
Lead Channel Pacing Threshold Pulse Width: 0.4 ms
Lead Channel Sensing Intrinsic Amplitude: 4 mV
Lead Channel Setting Pacing Amplitude: 2 V
Lead Channel Setting Pacing Amplitude: 2.5 V
Lead Channel Setting Pacing Pulse Width: 0.4 ms
Lead Channel Setting Sensing Sensitivity: 4 mV

## 2015-12-25 ENCOUNTER — Encounter: Payer: Self-pay | Admitting: Pulmonary Disease

## 2015-12-25 ENCOUNTER — Ambulatory Visit (INDEPENDENT_AMBULATORY_CARE_PROVIDER_SITE_OTHER): Payer: Medicare Other | Admitting: Pulmonary Disease

## 2015-12-25 DIAGNOSIS — G4733 Obstructive sleep apnea (adult) (pediatric): Secondary | ICD-10-CM | POA: Diagnosis not present

## 2015-12-29 NOTE — Progress Notes (Signed)
Routine follow up for OSA OSA remains well-controlled. He remains compliant with CPAP No further weight loss since last visit. He attributes this to holidays, etc  Filed Vitals:   12/25/15 1103  BP: 128/78  Pulse: 87  Height: 6' (1.829 m)  Weight: 297 lb 12.8 oz (135.081 kg)  SpO2: 93%   NAD HEENT WNL No JVD Chest clear Reg, no M Obese, soft, +BS No LE edema  IMPRESSION: Morbid obesity OSA - well controlled  PLAN: Cont CPAP @ current settings Encouraged further weight loss and discussed strategies F/U in one year or sooner as needed  Merton Border, MD PCCM service Mobile [phone] Pager [phone]

## 2016-01-17 ENCOUNTER — Encounter: Payer: Self-pay | Admitting: Internal Medicine

## 2016-02-20 ENCOUNTER — Encounter: Payer: Self-pay | Admitting: Family Medicine

## 2016-02-20 ENCOUNTER — Ambulatory Visit (INDEPENDENT_AMBULATORY_CARE_PROVIDER_SITE_OTHER): Payer: Medicare Other | Admitting: Family Medicine

## 2016-02-20 VITALS — BP 124/67 | HR 74 | Temp 98.2°F | Resp 16 | Wt 301.0 lb

## 2016-02-20 DIAGNOSIS — M10071 Idiopathic gout, right ankle and foot: Secondary | ICD-10-CM

## 2016-02-20 MED ORDER — PREDNISONE 10 MG PO TABS: ORAL_TABLET | ORAL | Status: DC

## 2016-02-20 NOTE — Progress Notes (Signed)
Name: William Woods   MRN: [id number]    DOB: [birth year]   Date:02/20/2016       Progress Note  Subjective  Chief Complaint  Chief Complaint  Patient presents with  . Gout    R foot    HPI Here for gout flair in R foot x 4 days.  Bothers top of R foot.   Last flair was 2-3 years ago.  Prednisone usually helps/ No problem-specific assessment & plan notes found for this encounter.   Past Medical History  Diagnosis Date  . Long Q-T syndrome   . Hypertension   . Thyroid disease   . Obesity (BMI 30-39.9)   . Obstructive sleep apnea   . Edema     Suspect diastolic heart failure  . Syncope   . CHB (complete heart block) ([place])   . Pacemaker     Medtronic Adapta L model ADDRL 1 (serial number NWE [id number]) pacemaker.    Past Surgical History  Procedure Laterality Date  . Pacemaker insertion  02/04/2012    Medtronic Adapta L model ADDRL 1 (serial number NWE [id number]) pacemaker.  . Other surgical history  02/04/2012     removal of a previously implanted MDT Reveal XT implantable loop recorder  . Cardiac catheterization      [facility]   . Cardiac catheterization  01/2012    [facility]: no significant CAD, EF 40%.   . Loop recorder implant N/A 01/21/2012    Procedure: LOOP RECORDER IMPLANT;  Surgeon: Deboraha Sprang, MD;  Location: [facility] CATH LAB;  Service: Cardiovascular;  Laterality: N/A;  . Permanent pacemaker insertion N/A 02/04/2012    Procedure: PERMANENT PACEMAKER INSERTION;  Surgeon: Thompson Grayer, MD;  Location: [facility] CATH LAB;  Service: Cardiovascular;  Laterality: N/A;  . Loop recorder explant N/A 02/04/2012    Procedure: LOOP RECORDER EXPLANT;  Surgeon: Thompson Grayer, MD;  Location: [facility] CATH LAB;  Service: Cardiovascular;  Laterality: N/A;    Family History  Problem Relation Age of Onset  . Kidney disease Father     long QT disease  . Cancer Mother     leukemia  . Bone cancer Son     deceased    Social History   Social History  . Marital Status: Married    Spouse Name: N/A  .  Number of Children: N/A  . Years of Education: N/A   Occupational History  . Service and [employer]    Social History Main Topics  . Smoking status: Former Smoker -- 4.00 packs/day for 25 years    Types: Cigarettes    Quit date: 02/06/1988  . Smokeless tobacco: Never Used  . Alcohol Use: 0.0 oz/week    0 Standard drinks or equivalent per week     Comment: occassional--special occassions  . Drug Use: No  . Sexual Activity: Not on file   Other Topics Concern  . Not on file   Social History Narrative   Lives in [place], with wife. No children, 2 sons deceased. No pets.      Pt owns [employer]. Quit tobacco at age 37. He lives with his wife. Pt is not active..the patient father had a history of long QT and syncope and died at age 58. His mother died at age 66 of cancer.      Current outpatient prescriptions:  .  amLODipine (NORVASC) 2.5 MG tablet, Take one tablet (2.5 mg) by mouth once daily at bedtime, Disp: 90 tablet, Rfl: 3 .  furosemide (  LASIX) 40 MG tablet, TAKE ONE TABLET BY MOUTH 3 DAYS PER WEEK, Disp: 90 tablet, Rfl: 0 .  losartan (COZAAR) 100 MG tablet, Take one tablet (100 mg) by mouth once daily in the morning, Disp: 90 tablet, Rfl: 3 .  predniSONE (DELTASONE) 10 MG tablet, Take 3 a day for 3 days, 2 a day for 3 days, then 1 a day for 3 days, Disp: 18 tablet, Rfl: 0  Allergies  Allergen Reactions  . Other     LONG QT SYNDROME--MANY DRUGS PATIENT HAS TO AVOID SEE WEBSITE FOR DETAILS Http://www.sads.org.uk/drugs_to_avoid.htm      Review of Systems  Constitutional: Negative for fever, chills, weight loss and malaise/fatigue.  HENT: Negative for hearing loss.   Eyes: Negative for blurred vision and double vision.  Respiratory: Negative for cough, shortness of breath and wheezing.   Cardiovascular: Negative for chest pain, palpitations and leg swelling.  Gastrointestinal: Negative for heartburn, abdominal pain and blood in stool.   Genitourinary: Negative for dysuria, urgency and frequency.  Musculoskeletal: Positive for joint pain (Rt foot pain).  Skin: Negative for rash.  Neurological: Negative for dizziness, tremors, weakness and headaches.      Objective  Filed Vitals:   02/20/16 1507  BP: 124/67  Pulse: 74  Temp: 98.2 F (36.8 C)  TempSrc: Oral  Resp: 16  Weight: 301 lb (136.533 kg)  SpO2: 97%    Physical Exam  Constitutional: He is well-developed, well-nourished, and in no distress. No distress.  Cardiovascular: Normal rate, regular rhythm and normal heart sounds.   Pulmonary/Chest: No respiratory distress. He has no wheezes. He has no rales.  Musculoskeletal:  Dorsum of R foot is red, hot, swollen and explicatively tender.  Vitals reviewed.      Recent Results (from the past 2160 hour(s))  Implantable device check     Status: None   Collection Time: 12/05/15  5:37 PM  Result Value Ref Range   Date Time Interrogation Session [id number]    Pulse Generator Manufacturer MERM    Pulse Gen Model ADDRL1 Adapta    Pulse Gen Serial Number [id number]    Implantable Pulse Generator Type Implantable Pulse Generator    Implantable Pulse Generator Implant Date [id number]    Implantable Lead Manufacturer MERM    Implantable Lead Model 5076 CapSureFix Novus    Implantable Lead Serial Number [id number]    Implantable Lead Implant Date [id number]    Implantable Lead Location [id number]    Implantable Lead Manufacturer MERM    Implantable Lead Model 5076 CapSureFix Novus    Implantable Lead Serial Number [id number]    Implantable Lead Implant Date [id number]    Implantable Lead Location [id number]    Lead Channel Setting Sensing Sensitivity 4.00 mV   Lead Channel Setting Pacing Amplitude 2.000 V   Lead Channel Setting Pacing Pulse Width 0.40 ms   Lead Channel Setting Pacing Amplitude 2.500 V   Lead Channel Impedance Value 387 ohm   Lead Channel Sensing Intrinsic Amplitude 4.00 mV   Lead  Channel Pacing Threshold Amplitude 0.750 V   Lead Channel Pacing Threshold Pulse Width 0.40 ms   Lead Channel Impedance Value 492 ohm   Lead Channel Pacing Threshold Amplitude 0.750 V   Lead Channel Pacing Threshold Pulse Width 0.40 ms   Battery Status OK    Battery Remaining Longevity 113 mo   Battery Voltage 2.78 V   Battery Impedance 257 ohm   Brady Statistic AP VP Percent 28 %   Brady Statistic AS VP Percent  72 %   Brady Statistic AP VS Percent 0 %   Brady Statistic AS VS Percent 0 %   Eval Rhythm CHB/Vp @ 30      Assessment & Plan  Problem List Items Addressed This Visit      Other   Gout - Primary   Relevant Medications   predniSONE (DELTASONE) 10 MG tablet      Meds ordered this encounter  Medications  . predniSONE (DELTASONE) 10 MG tablet    Sig: Take 3 a day for 3 days, 2 a day for 3 days, then 1 a day for 3 days    Dispense:  18 tablet    Refill:  0   1. Acute idiopathic gout of right foot  - predniSONE (DELTASONE) 10 MG tablet; Take 3 a day for 3 days, 2 a day for 3 days, then 1 a day for 3 days  Dispense: 18 tablet; Refill: 0

## 2016-03-05 ENCOUNTER — Ambulatory Visit (INDEPENDENT_AMBULATORY_CARE_PROVIDER_SITE_OTHER): Payer: Medicare Other | Admitting: *Deleted

## 2016-03-05 DIAGNOSIS — Z950 Presence of cardiac pacemaker: Secondary | ICD-10-CM

## 2016-03-05 DIAGNOSIS — I442 Atrioventricular block, complete: Secondary | ICD-10-CM | POA: Diagnosis not present

## 2016-03-05 NOTE — Progress Notes (Signed)
Remote pacemaker transmission.   

## 2016-03-22 LAB — CUP PACEART REMOTE DEVICE CHECK
Battery Impedance: 281 Ohm
Battery Remaining Longevity: 113 mo
Battery Voltage: 2.78 V
Brady Statistic AP VP Percent: 37 %
Brady Statistic AP VS Percent: 0 %
Brady Statistic AS VP Percent: 62 %
Brady Statistic AS VS Percent: 1 %
Date Time Interrogation Session: 20170516110543
Implantable Lead Implant Date: 20130416
Implantable Lead Implant Date: 20130416
Implantable Lead Location: 753859
Implantable Lead Location: 753860
Implantable Lead Model: 5076
Implantable Lead Model: 5076
Lead Channel Impedance Value: 408 Ohm
Lead Channel Impedance Value: 561 Ohm
Lead Channel Pacing Threshold Amplitude: 0.75 V
Lead Channel Pacing Threshold Amplitude: 1 V
Lead Channel Pacing Threshold Pulse Width: 0.4 ms
Lead Channel Pacing Threshold Pulse Width: 0.4 ms
Lead Channel Sensing Intrinsic Amplitude: 1.4 mV
Lead Channel Setting Pacing Amplitude: 1.5 V
Lead Channel Setting Pacing Amplitude: 2 V
Lead Channel Setting Pacing Pulse Width: 0.4 ms
Lead Channel Setting Sensing Sensitivity: 4 mV

## 2016-04-04 ENCOUNTER — Encounter: Payer: Self-pay | Admitting: Cardiology

## 2016-04-19 ENCOUNTER — Encounter: Payer: Self-pay | Admitting: Cardiology

## 2016-04-30 ENCOUNTER — Ambulatory Visit (INDEPENDENT_AMBULATORY_CARE_PROVIDER_SITE_OTHER): Payer: Medicare Other | Admitting: Family Medicine

## 2016-04-30 ENCOUNTER — Encounter: Payer: Self-pay | Admitting: Family Medicine

## 2016-04-30 VITALS — BP 120/71 | HR 81 | Temp 98.0°F | Resp 16 | Ht 72.0 in | Wt 300.0 lb

## 2016-04-30 DIAGNOSIS — Z0000 Encounter for general adult medical examination without abnormal findings: Secondary | ICD-10-CM

## 2016-04-30 DIAGNOSIS — I4581 Long QT syndrome: Secondary | ICD-10-CM | POA: Diagnosis not present

## 2016-04-30 DIAGNOSIS — R7309 Other abnormal glucose: Secondary | ICD-10-CM

## 2016-04-30 DIAGNOSIS — I10 Essential (primary) hypertension: Secondary | ICD-10-CM | POA: Diagnosis not present

## 2016-04-30 DIAGNOSIS — M1000 Idiopathic gout, unspecified site: Secondary | ICD-10-CM

## 2016-04-30 DIAGNOSIS — R739 Hyperglycemia, unspecified: Secondary | ICD-10-CM

## 2016-04-30 DIAGNOSIS — I442 Atrioventricular block, complete: Secondary | ICD-10-CM

## 2016-04-30 DIAGNOSIS — E785 Hyperlipidemia, unspecified: Secondary | ICD-10-CM | POA: Insufficient documentation

## 2016-04-30 MED ORDER — AMLODIPINE BESYLATE 2.5 MG PO TABS: ORAL_TABLET | ORAL | Status: DC

## 2016-04-30 MED ORDER — FUROSEMIDE 40 MG PO TABS: 40.0000 mg | ORAL_TABLET | Freq: Every day | ORAL | Status: DC

## 2016-04-30 MED ORDER — LOSARTAN POTASSIUM 100 MG PO TABS: ORAL_TABLET | ORAL | Status: DC

## 2016-04-30 NOTE — Progress Notes (Signed)
Name: William Woods.   MRN: [id number]    DOB: 10/12/1946   Date:04/30/2016       Progress Note  Subjective  Chief Complaint  Chief Complaint  Patient presents with  . Annual Exam    HPI Here for health maintenance annual physical.  He has HBP, complete heart block and has a pacer inplanted.  Also with sleep apnea. Uses CPAP.  Sees Dr. Jens Som about pacer.  No problem-specific assessment & plan notes found for this encounter.   Past Medical History  Diagnosis Date  . Long Q-T syndrome   . Hypertension   . Thyroid disease   . Obesity (BMI 30-39.9)   . Obstructive sleep apnea   . Edema     Suspect diastolic heart failure  . Syncope   . CHB (complete heart block) ([place])   . Pacemaker     Medtronic Adapta L model ADDRL 1 (serial number NWE [id number]) pacemaker.    Past Surgical History  Procedure Laterality Date  . Pacemaker insertion  02/04/2012    Medtronic Adapta L model ADDRL 1 (serial number NWE [id number]) pacemaker.  . Other surgical history  02/04/2012     removal of a previously implanted MDT Reveal XT implantable loop recorder  . Cardiac catheterization      [facility]   . Cardiac catheterization  01/2012    [facility]: no significant CAD, EF 40%.   . Loop recorder implant N/A 01/21/2012    Procedure: LOOP RECORDER IMPLANT;  Surgeon: Deboraha Sprang, MD;  Location: [facility] CATH LAB;  Service: Cardiovascular;  Laterality: N/A;  . Permanent pacemaker insertion N/A 02/04/2012    Procedure: PERMANENT PACEMAKER INSERTION;  Surgeon: Thompson Grayer, MD;  Location: [facility] CATH LAB;  Service: Cardiovascular;  Laterality: N/A;  . Loop recorder explant N/A 02/04/2012    Procedure: LOOP RECORDER EXPLANT;  Surgeon: Thompson Grayer, MD;  Location: [facility] CATH LAB;  Service: Cardiovascular;  Laterality: N/A;    Family History  Problem Relation Age of Onset  . Kidney disease Father     long QT disease  . Cancer Mother     leukemia  . Bone cancer Son     deceased    Social History   Social History  .  Marital Status: Married    Spouse Name: N/A  . Number of Children: N/A  . Years of Education: N/A   Occupational History  . Service and [employer]    Social History Main Topics  . Smoking status: Former Smoker -- 4.00 packs/day for 25 years    Types: Cigarettes    Quit date: 02/06/1988  . Smokeless tobacco: Never Used  . Alcohol Use: 0.0 oz/week    0 Standard drinks or equivalent per week     Comment: occassional--special occassions  . Drug Use: No  . Sexual Activity: Not on file   Other Topics Concern  . Not on file   Social History Narrative   Lives in [place], with wife. No children, 2 sons deceased. No pets.      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 He lives with his wife. Pt is not active..the patient father had a history of long QT and syncope and died at age 68. His mother died at age 86 of cancer.      Current outpatient prescriptions:  .  amLODipine (NORVASC) 2.5 MG tablet, Take one tablet (2.5 mg) by mouth once daily at bedtime, Disp: 90 tablet, Rfl: 3 .  furosemide (  LASIX) 40 MG tablet, Take 1 tablet (40 mg total) by mouth daily., Disp: 90 tablet, Rfl: 3 .  losartan (COZAAR) 100 MG tablet, Take one tablet (100 mg) by mouth once daily in the morning, Disp: 90 tablet, Rfl: 3  Allergies  Allergen Reactions  . Other     LONG QT SYNDROME--MANY DRUGS PATIENT HAS TO AVOID SEE WEBSITE FOR DETAILS Http://www.sads.org.uk/drugs_to_avoid.htm      Review of Systems  Constitutional: Negative for fever, chills, weight loss and malaise/fatigue.  HENT: Negative for hearing loss.   Eyes: Negative for blurred vision and double vision.  Respiratory: Negative for cough, shortness of breath and wheezing.   Cardiovascular: Negative for chest pain, palpitations and leg swelling.  Gastrointestinal: Negative for heartburn, abdominal pain and blood in stool.  Genitourinary: Negative for dysuria, urgency and frequency.  Musculoskeletal: Negative for  myalgias, joint pain and neck pain.  Skin: Negative for rash.  Neurological: Negative for dizziness, tremors, weakness and headaches.      Objective  Filed Vitals:   04/30/16 1459  BP: 120/71  Pulse: 81  Temp: 98 F (36.7 C)  TempSrc: Oral  Resp: 16  Height: 6' (1.829 m)  Weight: 300 lb (136.079 kg)    Physical Exam  Constitutional: He is oriented to person, place, and time and well-developed, well-nourished, and in no distress. No distress.  HENT:  Head: Normocephalic and atraumatic.  Right Ear: External ear normal.  Left Ear: External ear normal.  Nose: Nose normal.  Mouth/Throat: Oropharynx is clear and moist.  Eyes: Conjunctivae and EOM are normal. Pupils are equal, round, and reactive to light. No scleral icterus.  Neck: Normal range of motion. Neck supple. No thyromegaly present.  Cardiovascular: Normal rate, regular rhythm and normal heart sounds.  Exam reveals no gallop and no friction rub.   No murmur heard. Pulmonary/Chest: Effort normal and breath sounds normal. No respiratory distress. He has no wheezes. He has no rales.  Abdominal: Soft. Bowel sounds are normal. He exhibits no distension and no mass. There is no tenderness.  Morbidly obese  Genitourinary: Penis normal. He exhibits no abnormal testicular mass, no testicular tenderness, no abnormal scrotal mass, no scrotal tenderness and no epididymal tenderness.  Musculoskeletal: Normal range of motion. He exhibits edema (1+ bilateral pedal edema).  Lymphadenopathy:    He has no cervical adenopathy.  Neurological: He is alert and oriented to person, place, and time. No cranial nerve deficit. Coordination normal.  Skin: Skin is warm and dry. No rash noted. No erythema. No pallor.  Psychiatric: Mood, memory, affect and judgment normal.  Vitals reviewed.      Recent Results (from the past 2160 hour(s))  Implantable device - remote     Status: None   Collection Time: 03/05/16 11:05 AM  Result Value Ref  Range   Date Time Interrogation Session [id number]    Pulse Generator Manufacturer MERM    Pulse Gen Model ADDRL1 Adapta    Pulse Gen Serial Number [id number]    Implantable Pulse Generator Type Implantable Pulse Generator    Implantable Pulse Generator Implant Date [id number]    Implantable Lead Manufacturer [facility]    Implantable Lead Model 5076 CapSureFix Novus    Implantable Lead Serial Number [id number]    Implantable Lead Implant Date MD:[id number]    Implantable Lead Location [id number]    Implantable Lead Manufacturer [facility]    Implantable Lead Model 5076 CapSureFix Novus    Implantable Lead Serial Number [id number]    Implantable Lead Implant Date MD:[id number]  Implantable Lead Location [phone]    Lead Channel Setting Sensing Sensitivity 4.00 mV   Lead Channel Setting Pacing Amplitude 1.500 V   Lead Channel Setting Pacing Pulse Width 0.40 ms   Lead Channel Setting Pacing Amplitude 2.000 V   Lead Channel Impedance Value 408 ohm   Lead Channel Sensing Intrinsic Amplitude 1.4 mV   Lead Channel Pacing Threshold Amplitude 0.75 V   Lead Channel Pacing Threshold Pulse Width 0.4 ms   Lead Channel Impedance Value 561 ohm   Lead Channel Pacing Threshold Amplitude 1 V   Lead Channel Pacing Threshold Pulse Width 0.4 ms   Battery Status Unknown    Battery Remaining Longevity 113 mo   Battery Voltage 2.78 V   Battery Impedance 281 ohm   Brady Statistic AP VP Percent 37 %   Brady Statistic AS VP Percent 62 %   Brady Statistic AP VS Percent 0 %   Brady Statistic AS VS Percent 1 %   Eval Rhythm ApVp      Assessment & Plan  Problem List Items Addressed This Visit      Cardiovascular and Mediastinum   Long Q-T syndrome   Relevant Medications   losartan (COZAAR) 100 MG tablet   furosemide (LASIX) 40 MG tablet   amLODipine (NORVASC) 2.5 MG tablet   Hypertension   Relevant Medications   losartan (COZAAR) 100 MG tablet   furosemide (LASIX) 40 MG tablet   amLODipine (NORVASC) 2.5  MG tablet   Other Relevant Orders   Lipid Profile   COMPLETE METABOLIC PANEL WITH GFR   Complete heart block (HCC)   Relevant Medications   losartan (COZAAR) 100 MG tablet   furosemide (LASIX) 40 MG tablet   amLODipine (NORVASC) 2.5 MG tablet     Other   Gout   Relevant Orders   CBC with Differential   Uric acid    Other Visit Diagnoses    Routine health maintenance    -  Primary    Elevated blood sugar        Relevant Orders    HgB A1c       Meds ordered this encounter  Medications  . losartan (COZAAR) 100 MG tablet    Sig: Take one tablet (100 mg) by mouth once daily in the morning    Dispense:  90 tablet    Refill:  3  . furosemide (LASIX) 40 MG tablet    Sig: Take 1 tablet (40 mg total) by mouth daily.    Dispense:  90 tablet    Refill:  3  . amLODipine (NORVASC) 2.5 MG tablet    Sig: Take one tablet (2.5 mg) by mouth once daily at bedtime    Dispense:  90 tablet    Refill:  3  1. Routine health maintenance   2. Essential hypertension  - Lipid Profile - COMPLETE METABOLIC PANEL WITH GFR - losartan (COZAAR) 100 MG tablet; Take one tablet (100 mg) by mouth once daily in the morning  Dispense: 90 tablet; Refill: 3 - furosemide (LASIX) 40 MG tablet; Take 1 tablet (40 mg total) by mouth daily.  Dispense: 90 tablet; Refill: 3 - amLODipine (NORVASC) 2.5 MG tablet; Take one tablet (2.5 mg) by mouth once daily at bedtime  Dispense: 90 tablet; Refill: 3  3. Long Q-T syndrome   4. Complete heart block (HCC) Implanted pacer.  5. Idiopathic gout, unspecified chronicity, unspecified site  - CBC with Differential - Uric acid  6. Elevated blood sugar  -  HgB A1c

## 2016-05-01 ENCOUNTER — Other Ambulatory Visit: Payer: Medicare Other

## 2016-05-01 LAB — CBC WITH DIFFERENTIAL/PLATELET
Basophils Absolute: 0 cells/uL (ref 0–200)
Basophils Relative: 0 %
Eosinophils Absolute: 180 cells/uL (ref 15–500)
Eosinophils Relative: 3 %
HCT: 43.2 % (ref 38.5–50.0)
Hemoglobin: 14.2 g/dL (ref 13.2–17.1)
Lymphocytes Relative: 26 %
Lymphs Abs: 1560 cells/uL (ref 850–3900)
MCH: 26.5 pg — ABNORMAL LOW (ref 27.0–33.0)
MCHC: 32.9 g/dL (ref 32.0–36.0)
MCV: 80.6 fL (ref 80.0–100.0)
MPV: 10 fL (ref 7.5–12.5)
Monocytes Absolute: 600 cells/uL (ref 200–950)
Monocytes Relative: 10 %
Neutro Abs: 3660 cells/uL (ref 1500–7800)
Neutrophils Relative %: 61 %
Platelets: 128 10*3/uL — ABNORMAL LOW (ref 140–400)
RBC: 5.36 MIL/uL (ref 4.20–5.80)
RDW: 14.6 % (ref 11.0–15.0)
WBC: 6 10*3/uL (ref 3.8–10.8)

## 2016-05-02 LAB — COMPLETE METABOLIC PANEL WITH GFR
ALT: 11 U/L (ref 9–46)
AST: 13 U/L (ref 10–35)
Albumin: 3.9 g/dL (ref 3.6–5.1)
Alkaline Phosphatase: 70 U/L (ref 40–115)
BUN: 15 mg/dL (ref 7–25)
CO2: 25 mmol/L (ref 20–31)
Calcium: 8.8 mg/dL (ref 8.6–10.3)
Chloride: 106 mmol/L (ref 98–110)
Creat: 1.02 mg/dL (ref 0.70–1.25)
GFR, Est African American: 87 mL/min (ref >=60)
GFR, Est Non African American: 75 mL/min (ref >=60)
Glucose, Bld: 95 mg/dL (ref 65–99)
Potassium: 4.3 mmol/L (ref 3.5–5.3)
Sodium: 139 mmol/L (ref 135–146)
Total Bilirubin: 0.7 mg/dL (ref 0.2–1.2)
Total Protein: 6.4 g/dL (ref 6.1–8.1)

## 2016-05-02 LAB — LIPID PANEL
Cholesterol: 181 mg/dL (ref 125–200)
HDL: 38 mg/dL — ABNORMAL LOW (ref >=40)
LDL Cholesterol: 116 mg/dL (ref <130)
Total CHOL/HDL Ratio: 4.8 Ratio (ref <=5.0)
Triglycerides: 134 mg/dL (ref <150)
VLDL: 27 mg/dL (ref <30)

## 2016-05-02 LAB — URIC ACID: Uric Acid, Serum: 7 mg/dL (ref 4.0–8.0)

## 2016-05-02 LAB — HEMOGLOBIN A1C
Hgb A1c MFr Bld: 5.7 % — ABNORMAL HIGH (ref <5.7)
Mean Plasma Glucose: 117 mg/dL

## 2016-06-04 ENCOUNTER — Ambulatory Visit (INDEPENDENT_AMBULATORY_CARE_PROVIDER_SITE_OTHER): Payer: Medicare Other | Admitting: *Deleted

## 2016-06-04 DIAGNOSIS — I442 Atrioventricular block, complete: Secondary | ICD-10-CM

## 2016-06-04 DIAGNOSIS — Z950 Presence of cardiac pacemaker: Secondary | ICD-10-CM

## 2016-06-04 NOTE — Progress Notes (Signed)
Remote pacemaker transmission.   

## 2016-06-05 ENCOUNTER — Encounter: Payer: Self-pay | Admitting: Cardiology

## 2016-06-25 LAB — CUP PACEART REMOTE DEVICE CHECK
Battery Impedance: 281 Ohm
Battery Remaining Longevity: 112 mo
Battery Voltage: 2.78 V
Brady Statistic AP VP Percent: 36 %
Brady Statistic AP VS Percent: 0 %
Brady Statistic AS VP Percent: 63 %
Brady Statistic AS VS Percent: 1 %
Date Time Interrogation Session: 20170815104017
Implantable Lead Implant Date: 20130416
Implantable Lead Implant Date: 20130416
Implantable Lead Location: 753859
Implantable Lead Location: 753860
Implantable Lead Model: 5076
Implantable Lead Model: 5076
Lead Channel Impedance Value: 392 Ohm
Lead Channel Impedance Value: 537 Ohm
Lead Channel Pacing Threshold Amplitude: 0.75 V
Lead Channel Pacing Threshold Amplitude: 1 V
Lead Channel Pacing Threshold Pulse Width: 0.4 ms
Lead Channel Pacing Threshold Pulse Width: 0.4 ms
Lead Channel Sensing Intrinsic Amplitude: 2.8 mV
Lead Channel Setting Pacing Amplitude: 1.5 V
Lead Channel Setting Pacing Amplitude: 2 V
Lead Channel Setting Pacing Pulse Width: 0.4 ms
Lead Channel Setting Sensing Sensitivity: 4 mV

## 2016-07-17 DIAGNOSIS — L851 Acquired keratosis [keratoderma] palmaris et plantaris: Secondary | ICD-10-CM | POA: Diagnosis not present

## 2016-07-17 DIAGNOSIS — Q828 Other specified congenital malformations of skin: Secondary | ICD-10-CM | POA: Diagnosis not present

## 2016-09-02 ENCOUNTER — Ambulatory Visit (INDEPENDENT_AMBULATORY_CARE_PROVIDER_SITE_OTHER): Payer: Medicare Other | Admitting: Family Medicine

## 2016-09-02 ENCOUNTER — Encounter: Payer: Self-pay | Admitting: Family Medicine

## 2016-09-02 VITALS — BP 138/82 | HR 81 | Temp 97.9°F | Resp 16 | Ht 72.0 in | Wt 297.0 lb

## 2016-09-02 DIAGNOSIS — I10 Essential (primary) hypertension: Secondary | ICD-10-CM

## 2016-09-02 NOTE — Progress Notes (Signed)
Name: William Woods.   MRN: [id number]    DOB: [birth year]   Date:09/02/2016       Progress Note  Subjective  Chief Complaint  Chief Complaint  Patient presents with  . Hypertension    HPI  Patient left without being seen.  No problem-specific Assessment & Plan notes found for this encounter.   Past Medical History:  Diagnosis Date  . CHB (complete heart block) ([place])   . Edema    Suspect diastolic heart failure  . Hypertension   . Long Q-T syndrome   . Obesity (BMI 30-39.9)   . Obstructive sleep apnea   . Pacemaker    Medtronic Adapta L model ADDRL 1 (serial number NWE [id number]) pacemaker.  . Syncope   . Thyroid disease     Past Surgical History:  Procedure Laterality Date  . CARDIAC CATHETERIZATION     [facility]   . CARDIAC CATHETERIZATION  01/2012   [facility]: no significant CAD, EF 40%.   Marland Kitchen LOOP RECORDER EXPLANT N/A 02/04/2012   Procedure: LOOP RECORDER EXPLANT;  Surgeon: Thompson Grayer, MD;  Location: [facility] CATH LAB;  Service: Cardiovascular;  Laterality: N/A;  . LOOP RECORDER IMPLANT N/A 01/21/2012   Procedure: LOOP RECORDER IMPLANT;  Surgeon: Deboraha Sprang, MD;  Location: [facility] CATH LAB;  Service: Cardiovascular;  Laterality: N/A;  . OTHER SURGICAL HISTORY  02/04/2012    removal of a previously implanted MDT Reveal XT implantable loop recorder  . PACEMAKER INSERTION  02/04/2012   Medtronic Adapta L model ADDRL 1 (serial number NWE [id number]) pacemaker.  Marland Kitchen PERMANENT PACEMAKER INSERTION N/A 02/04/2012   Procedure: PERMANENT PACEMAKER INSERTION;  Surgeon: Thompson Grayer, MD;  Location: [facility] CATH LAB;  Service: Cardiovascular;  Laterality: N/A;    Family History  Problem Relation Age of Onset  . Kidney disease Father     long QT disease  . Cancer Mother     leukemia  . Bone cancer Son     deceased    Social History   Social History  . Marital status: Married    Spouse name: N/A  . Number of children: N/A  . Years of education: N/A   Occupational History  . Service and Transport planner   Social History Main Topics  . Smoking status: Former Smoker    Packs/day: 4.00    Years: 25.00    Types: Cigarettes    Quit date: 02/06/1988  . Smokeless tobacco: Never Used  . Alcohol use 0.0 oz/week     Comment: occassional--special occassions  . Drug use: No  . Sexual activity: Not on file   Other Topics Concern  . Not on file   Social History Narrative   Lives in [place], with wife. No children, 2 sons deceased. No pets.      Pt owns [employer]. Quit tobacco at age 62. He lives with his wife. Pt is not active..the patient father had a history of long QT and syncope and died at age 70. His mother died at age 67 of cancer.      Current Outpatient Prescriptions:  .  amLODipine (NORVASC) 2.5 MG tablet, Take one tablet (2.5 mg) by mouth once daily at bedtime, Disp: 90 tablet, Rfl: 3 .  furosemide (LASIX) 40 MG tablet, Take 1 tablet (40 mg total) by mouth daily., Disp: 90 tablet, Rfl: 3 .  losartan (COZAAR) 100 MG tablet, Take one tablet (100 mg) by mouth once daily in the morning, Disp: 90  tablet, Rfl: 3  Allergies  Allergen Reactions  . Other     LONG QT SYNDROME--MANY DRUGS PATIENT HAS TO AVOID SEE WEBSITE FOR DETAILS Http://www.sads.org.uk/drugs_to_avoid.htm      ROS    Objective  Vitals:   09/02/16 1539  BP: 138/82  Pulse: 81  Resp: 16  Temp: 97.9 F (36.6 C)  TempSrc: Oral  Weight: 297 lb (134.7 kg)  Height: 6' (1.829 m)    Physical Exam     No results found for this or any previous visit (from the past 2160 hour(s)).   Assessment & Plan  Problem List Items Addressed This Visit    None      No orders of the defined types were placed in this encounter.

## 2016-09-03 ENCOUNTER — Ambulatory Visit (INDEPENDENT_AMBULATORY_CARE_PROVIDER_SITE_OTHER): Payer: Medicare Other | Admitting: *Deleted

## 2016-09-03 DIAGNOSIS — I442 Atrioventricular block, complete: Secondary | ICD-10-CM | POA: Diagnosis not present

## 2016-09-03 NOTE — Progress Notes (Signed)
Remote pacemaker transmission.   

## 2016-09-13 LAB — CUP PACEART REMOTE DEVICE CHECK
Battery Impedance: 306 Ohm
Battery Remaining Longevity: 106 mo
Battery Voltage: 2.78 V
Brady Statistic AP VP Percent: 35 %
Brady Statistic AP VS Percent: 0 %
Brady Statistic AS VP Percent: 64 %
Brady Statistic AS VS Percent: 1 %
Date Time Interrogation Session: 20171114122950
Implantable Lead Implant Date: 20130416
Implantable Lead Implant Date: 20130416
Implantable Lead Location: 753859
Implantable Lead Location: 753860
Implantable Lead Model: 5076
Implantable Lead Model: 5076
Implantable Pulse Generator Implant Date: 20130416
Lead Channel Impedance Value: 404 Ohm
Lead Channel Impedance Value: 582 Ohm
Lead Channel Pacing Threshold Amplitude: 0.875 V
Lead Channel Pacing Threshold Amplitude: 1.125 V
Lead Channel Pacing Threshold Pulse Width: 0.4 ms
Lead Channel Pacing Threshold Pulse Width: 0.4 ms
Lead Channel Sensing Intrinsic Amplitude: 2.8 mV
Lead Channel Setting Pacing Amplitude: 1.75 V
Lead Channel Setting Pacing Amplitude: 2.25 V
Lead Channel Setting Pacing Pulse Width: 0.4 ms
Lead Channel Setting Sensing Sensitivity: 4 mV

## 2016-10-01 ENCOUNTER — Ambulatory Visit: Payer: Medicare Other | Admitting: Family Medicine

## 2016-11-11 ENCOUNTER — Encounter: Payer: Self-pay | Admitting: Family Medicine

## 2016-11-11 ENCOUNTER — Ambulatory Visit (INDEPENDENT_AMBULATORY_CARE_PROVIDER_SITE_OTHER): Payer: Medicare Other | Admitting: Family Medicine

## 2016-11-11 ENCOUNTER — Encounter: Payer: Self-pay | Admitting: *Deleted

## 2016-11-11 VITALS — BP 140/74 | HR 79 | Temp 98.0°F | Resp 16 | Ht 72.0 in | Wt 303.0 lb

## 2016-11-11 DIAGNOSIS — E119 Type 2 diabetes mellitus without complications: Secondary | ICD-10-CM

## 2016-11-11 DIAGNOSIS — Z950 Presence of cardiac pacemaker: Secondary | ICD-10-CM

## 2016-11-11 DIAGNOSIS — I442 Atrioventricular block, complete: Secondary | ICD-10-CM | POA: Diagnosis not present

## 2016-11-11 DIAGNOSIS — I10 Essential (primary) hypertension: Secondary | ICD-10-CM | POA: Diagnosis not present

## 2016-11-11 DIAGNOSIS — R739 Hyperglycemia, unspecified: Secondary | ICD-10-CM | POA: Insufficient documentation

## 2016-11-11 LAB — POCT GLYCOSYLATED HEMOGLOBIN (HGB A1C): Hemoglobin A1C: 5.6

## 2016-11-11 NOTE — Progress Notes (Signed)
Name: William Woods.   MRN: [id number]    DOB: 12-09-1945   Date:11/11/2016       Progress Note  Subjective  Chief Complaint  Chief Complaint  Patient presents with  . Hypertension  . Hyperglycemia    HPI Here for f/u of pre-diabetes.  He has HBP managed by card.  He has changed diet somewhat but he has started gaining weight again.  He is feeling well overall.  No problem-specific Assessment & Plan notes found for this encounter.   Past Medical History:  Diagnosis Date  . CHB (complete heart block) ([place])   . Edema    Suspect diastolic heart failure  . Hypertension   . Long Q-T syndrome   . Obesity (BMI 30-39.9)   . Obstructive sleep apnea   . Pacemaker    Medtronic Adapta L model ADDRL 1 (serial number [id number]) pacemaker.  . Syncope   . Thyroid disease     Past Surgical History:  Procedure Laterality Date  . CARDIAC CATHETERIZATION     [facility]   . CARDIAC CATHETERIZATION  01/2012   [facility]: no significant CAD, EF 40%.   Marland Kitchen LOOP RECORDER EXPLANT N/A 02/04/2012   Procedure: LOOP RECORDER EXPLANT;  Surgeon: Thompson Grayer, MD;  Location: [facility] CATH LAB;  Service: Cardiovascular;  Laterality: N/A;  . LOOP RECORDER IMPLANT N/A 01/21/2012   Procedure: LOOP RECORDER IMPLANT;  Surgeon: Deboraha Sprang, MD;  Location: [facility] CATH LAB;  Service: Cardiovascular;  Laterality: N/A;  . OTHER SURGICAL HISTORY  02/04/2012    removal of a previously implanted MDT Reveal XT implantable loop recorder  . PACEMAKER INSERTION  02/04/2012   Medtronic Adapta L model ADDRL 1 (serial number [id number]) pacemaker.  Marland Kitchen PERMANENT PACEMAKER INSERTION N/A 02/04/2012   Procedure: PERMANENT PACEMAKER INSERTION;  Surgeon: Thompson Grayer, MD;  Location: [facility] CATH LAB;  Service: Cardiovascular;  Laterality: N/A;    Family History  Problem Relation Age of Onset  . Kidney disease Father     long QT disease  . Cancer Mother     leukemia  . Bone cancer Son     deceased    Social History   Social History  .  Marital status: Married    Spouse name: N/A  . Number of children: N/A  . Years of education: N/A   Occupational History  . Service and Forensic psychologist   Social History Main Topics  . Smoking status: Former Smoker    Packs/day: 4.00    Years: 25.00    Types: Cigarettes    Quit date: 02/06/1988  . Smokeless tobacco: Never Used  . Alcohol use 0.0 oz/week     Comment: occassional--special occassions  . Drug use: No  . Sexual activity: Not on file   Other Topics Concern  . Not on file   Social History Narrative   Lives in [place], with wife. No children, 2 sons deceased. No pets.      Pt owns [employer]. Quit tobacco at age 84. He lives with his wife. Pt is not active..the patient father had a history of long QT and syncope and died at age 10. His mother died at age 10 of cancer.      Current Outpatient Prescriptions:  .  amLODipine (NORVASC) 2.5 MG tablet, Take one tablet (2.5 mg) by mouth once daily at bedtime, Disp: 90 tablet, Rfl: 3 .  furosemide (LASIX) 40 MG tablet, Take 1 tablet (40 mg total) by  mouth daily., Disp: 90 tablet, Rfl: 3 .  losartan (COZAAR) 100 MG tablet, Take one tablet (100 mg) by mouth once daily in the morning, Disp: 90 tablet, Rfl: 3  Allergies  Allergen Reactions  . Other     LONG QT SYNDROME--MANY DRUGS PATIENT HAS TO AVOID SEE WEBSITE FOR DETAILS Http://www.sads.org.uk/drugs_to_avoid.htm      Review of Systems  Constitutional: Negative for chills, fever, malaise/fatigue and weight loss.  HENT: Negative for hearing loss and tinnitus.   Eyes: Negative for blurred vision and double vision.  Respiratory: Negative for cough, shortness of breath and wheezing.   Cardiovascular: Negative for chest pain, palpitations and leg swelling.  Gastrointestinal: Negative for abdominal pain, blood in stool and heartburn.  Genitourinary: Negative for dysuria, frequency and urgency.  Musculoskeletal: Negative for joint  pain and myalgias.  Skin: Negative for rash.  Neurological: Negative for dizziness, tingling, tremors, weakness and headaches.      Objective  Vitals:   11/11/16 1337  BP: 140/74  Pulse: 79  Resp: 16  Temp: 98 F (36.7 C)  TempSrc: Oral  Weight: (!) 303 lb (137.4 kg)  Height: 6' (1.829 m)    Physical Exam  Constitutional: He is oriented to person, place, and time and well-developed, well-nourished, and in no distress. No distress.  HENT:  Head: Normocephalic and atraumatic.  Eyes: Conjunctivae and EOM are normal. Pupils are equal, round, and reactive to light. No scleral icterus.  Neck: Normal range of motion. Neck supple. Carotid bruit is not present. No thyromegaly present.  Cardiovascular: Normal rate, regular rhythm and normal heart sounds.  Exam reveals no gallop and no friction rub.   No murmur heard. Pulmonary/Chest: Effort normal and breath sounds normal. No respiratory distress. He has no wheezes. He has no rales.  Abdominal: Soft. Bowel sounds are normal. He exhibits no distension and no mass. There is no tenderness.  Morbidly obese  Musculoskeletal: He exhibits edema (trace bilateral pedal edema.).  Lymphadenopathy:    He has no cervical adenopathy.  Neurological: He is alert and oriented to person, place, and time.  Vitals reviewed.      Recent Results (from the past 2160 hour(s))  Implantable device - remote     Status: None   Collection Time: 09/03/16 12:29 PM  Result Value Ref Range   Date Time Interrogation Session [id number]    Pulse Generator Manufacturer MERM    Pulse Gen Model ADDRL1 Adapta    Pulse Gen Serial Number [id number]    Clinic Name [facility] Pulse Generator Type Implantable Pulse Generator    Implantable Pulse Generator Implant Date [id number]    Implantable Lead Manufacturer [facility]    Implantable Lead Model 5076 CapSureFix Novus    Implantable Lead Serial Number [id number]    Implantable Lead Implant Date  [id number]    Implantable Lead Location Detail 1 APEX    Implantable Lead Location [id number]    Implantable Lead Manufacturer MERM    Implantable Lead Model 5076 CapSureFix Novus    Implantable Lead Serial Number [id number]    Implantable Lead Implant Date [id number]    Implantable Lead Location Detail 1 APPENDAGE    Implantable Lead Location [id number]    Lead Channel Setting Sensing Sensitivity 4.00 mV   Lead Channel Setting Pacing Amplitude 1.750 V   Lead Channel Setting Pacing Pulse Width 0.40 ms   Lead Channel Setting Pacing Amplitude 2.250 V   Lead Channel Impedance Value 404 ohm   Lead Channel Sensing Intrinsic  Amplitude 2.8 mV   Lead Channel Pacing Threshold Amplitude 0.875 V   Lead Channel Pacing Threshold Pulse Width 0.40 ms   Lead Channel Impedance Value 582 ohm   Lead Channel Pacing Threshold Amplitude 1.125 V   Lead Channel Pacing Threshold Pulse Width 0.40 ms   Battery Status OK    Battery Remaining Longevity 106 mo   Battery Voltage 2.78 V   Battery Impedance 306 ohm   Brady Statistic AP VP Percent 35 %   Brady Statistic AS VP Percent 64 %   Brady Statistic AP VS Percent 0 %   Brady Statistic AS VS Percent 1 %   Eval Rhythm ApVp   POCT HgB A1C     Status: Normal   Collection Time: 11/11/16  2:04 PM  Result Value Ref Range   Hemoglobin A1C 5.6%      Assessment & Plan  Problem List Items Addressed This Visit      Cardiovascular and Mediastinum   Hypertension   Complete heart block (HCC)     Other   Morbid obesity (HCC)   Pacemaker-MDT   Elevated blood sugar    Other Visit Diagnoses    Diabetes mellitus without complication ([place])    -  Primary   Relevant Orders   POCT HgB A1C (Completed)      No orders of the defined types were placed in this encounter.  1. Diabetes mellitus without complication (HCC)  - POCT HgB A1C-5.6  2. Elevated blood sugar Discussed reducing calories and exercising.  3. Morbid obesity ([place])   4. Essential hypertension Cont  meds  5. Complete heart block ([place])   6. Pacemaker-MDT

## 2016-12-17 ENCOUNTER — Encounter: Payer: Self-pay | Admitting: Internal Medicine

## 2016-12-17 ENCOUNTER — Ambulatory Visit (INDEPENDENT_AMBULATORY_CARE_PROVIDER_SITE_OTHER): Payer: Medicare Other | Admitting: Internal Medicine

## 2016-12-17 VITALS — BP 132/68 | HR 68 | Ht 72.0 in | Wt 301.0 lb

## 2016-12-17 DIAGNOSIS — Z950 Presence of cardiac pacemaker: Secondary | ICD-10-CM | POA: Diagnosis not present

## 2016-12-17 DIAGNOSIS — I480 Paroxysmal atrial fibrillation: Secondary | ICD-10-CM

## 2016-12-17 DIAGNOSIS — I10 Essential (primary) hypertension: Secondary | ICD-10-CM

## 2016-12-17 DIAGNOSIS — I5030 Unspecified diastolic (congestive) heart failure: Secondary | ICD-10-CM

## 2016-12-17 DIAGNOSIS — I442 Atrioventricular block, complete: Secondary | ICD-10-CM

## 2016-12-17 LAB — CUP PACEART INCLINIC DEVICE CHECK
Battery Impedance: 355 Ohm
Battery Remaining Longevity: 104 mo
Battery Voltage: 2.78 V
Brady Statistic AP VP Percent: 33 %
Brady Statistic AP VS Percent: 0 %
Brady Statistic AS VP Percent: 66 %
Brady Statistic AS VS Percent: 1 %
Date Time Interrogation Session: 20180227170533
Implantable Lead Implant Date: 20130416
Implantable Lead Implant Date: 20130416
Implantable Lead Location: 753859
Implantable Lead Location: 753860
Implantable Lead Model: 5076
Implantable Lead Model: 5076
Implantable Pulse Generator Implant Date: 20130416
Lead Channel Impedance Value: 404 Ohm
Lead Channel Impedance Value: 559 Ohm
Lead Channel Pacing Threshold Amplitude: 0.75 V
Lead Channel Pacing Threshold Amplitude: 1 V
Lead Channel Pacing Threshold Pulse Width: 0.4 ms
Lead Channel Pacing Threshold Pulse Width: 0.4 ms
Lead Channel Sensing Intrinsic Amplitude: 4 mV
Lead Channel Setting Pacing Amplitude: 1.625
Lead Channel Setting Pacing Amplitude: 2 V
Lead Channel Setting Pacing Pulse Width: 0.4 ms
Lead Channel Setting Sensing Sensitivity: 4 mV

## 2016-12-17 MED ORDER — LOSARTAN POTASSIUM 100 MG PO TABS: ORAL_TABLET | ORAL | 3 refills | Status: DC

## 2016-12-17 MED ORDER — AMLODIPINE BESYLATE 2.5 MG PO TABS: ORAL_TABLET | ORAL | 3 refills | Status: DC

## 2016-12-17 MED ORDER — FUROSEMIDE 40 MG PO TABS: 40.0000 mg | ORAL_TABLET | Freq: Every day | ORAL | 3 refills | Status: DC

## 2016-12-17 NOTE — Patient Instructions (Signed)
Medication Instructions: - Your physician recommends that you continue on your current medications as directed. Please refer to the Current Medication list given to you today.  Labwork: - none ordered  Procedures/Testing: - none ordered  Follow-Up: - Remote monitoring is used to monitor your Pacemaker of ICD from home. This monitoring reduces the number of office visits required to check your device to one time per year. It allows [place] to keep an eye on the functioning of your device to ensure it is working properly. You are scheduled for a device check from home on 03/18/17. You may send your transmission at any time that day. If you have a wireless device, the transmission will be sent automatically. After your physician reviews your transmission, you will receive a postcard with your next transmission date.  - Your physician wants you to follow-up in: 1 year with Dr. Caryl Comes. You will receive a reminder letter in the mail two months in advance. If you don't receive a letter, please call our office to schedule the follow-up appointment.   Any Additional Special Instructions Will Be Listed Below (If Applicable).     If you need a refill on your cardiac medications before your next appointment, please call your pharmacy.

## 2016-12-17 NOTE — Addendum Note (Signed)
Addended by: Alvis Lemmings C on: 12/17/2016 01:25 PM   Modules accepted: Orders

## 2016-12-17 NOTE — Progress Notes (Signed)
Patient Care Team: [name], DO as PCP - General (Family Medicine)   HPI  William Woods. is a 71 y.o. male Seen in followup for pacemaker implanted for symptomatic intermittent complete heart block April 2013. He had a history of syncope with a prolonged QT interval and he had undergone loop recorder insertion. With recurrent syncope heart block was identified.   Also he was treated with beta blockers for hypertension and long QT. He has stopped this and his now on losartan   He has been  diagnosed with Sleep apnea and has been improved with CPAP  His BP has been much better since the control of his sleep apnea   h  Edema is better.  .   He has  told me of the death of his two sons, one from cancer and one from cocaine overdose  And Gods calling him back via God so Loved that he Gave      Past Medical History:  Diagnosis Date  . CHB (complete heart block) ([place])   . Edema    Suspect diastolic heart failure  . Hypertension   . Long Q-T syndrome   . Obesity (BMI 30-39.9)   . Obstructive sleep apnea   . Pacemaker    Medtronic Adapta L model ADDRL 1 (serial number NWE [id number]) pacemaker.  . Syncope   . Thyroid disease     Past Surgical History:  Procedure Laterality Date  . CARDIAC CATHETERIZATION     [facility]   . CARDIAC CATHETERIZATION  01/2012   [facility]: no significant CAD, EF 40%.   Marland Kitchen LOOP RECORDER EXPLANT N/A 02/04/2012   Procedure: LOOP RECORDER EXPLANT;  Surgeon: Thompson Grayer, MD;  Location: [facility] CATH LAB;  Service: Cardiovascular;  Laterality: N/A;  . LOOP RECORDER IMPLANT N/A 01/21/2012   Procedure: LOOP RECORDER IMPLANT;  Surgeon: Deboraha Sprang, MD;  Location: [facility] CATH LAB;  Service: Cardiovascular;  Laterality: N/A;  . OTHER SURGICAL HISTORY  02/04/2012    removal of a previously implanted MDT Reveal XT implantable loop recorder  . PACEMAKER INSERTION  02/04/2012   Medtronic Adapta L model ADDRL 1 (serial number [id number] [id number]) pacemaker.  Marland Kitchen PERMANENT  PACEMAKER INSERTION N/A 02/04/2012   Procedure: PERMANENT PACEMAKER INSERTION;  Surgeon: Thompson Grayer, MD;  Location: [facility] CATH LAB;  Service: Cardiovascular;  Laterality: N/A;    Current Outpatient Prescriptions  Medication Sig Dispense Refill  . amLODipine (NORVASC) 2.5 MG tablet Take one tablet (2.5 mg) by mouth once daily at bedtime 90 tablet 3  . furosemide (LASIX) 40 MG tablet Take 1 tablet (40 mg total) by mouth daily. 90 tablet 3  . losartan (COZAAR) 100 MG tablet Take one tablet (100 mg) by mouth once daily in the morning 90 tablet 3   No current facility-administered medications for this visit.     Allergies  Allergen Reactions  . Other     LONG QT SYNDROME--MANY DRUGS PATIENT HAS TO AVOID SEE WEBSITE FOR DETAILS Http://www.sads.org.uk/drugs_to_avoid.htm     Review of Systems negative except from HPI and PMH  Physical Exam BP 132/68 (BP Location: Left Arm, Patient Position: Sitting, Cuff Size: Normal)   Pulse 68   Ht 6' (1.829 m)   Wt (!) 301 lb (136.5 kg)   BMI 40.82 kg/m  Well developed and well nourished in no acute distress HENT normal E scleral and icterus clear Neck Supple JVP flat; carotids brisk and full Clear to ausculation Device pocket well healed; without  hematoma or erythema.  There is no tethering  *Regular rate and rhythm, no murmurs gallops or rub Soft with active bowel sounds No clubbing cyanosis  Tr  Edema Alert and oriented, grossly normal motor and sensory function Skin Warm and Dry  ECG  P-synchronous/ AV  Pacing    Assessment and  Plan  Complete Heart block stable  Pacemaker- Medtronic  The patient's device was interrogated.  The information was reviewed. No changes were made in the programming.    OSA  Much improved  Continue therapy  HFpEF--volume overload chronic   HTN  Atrial fibrillation  He is noted to have paroxysmal atrial fibrillation the longest of which has been 3 hours. We have discussed its implications and the  potential need for anticoagulation. At his young age we will use a threshold of probably 6 hours.  I suggested he could take a diuretic twice a day  as needed He has been  More compliant wth CPAP  His blood pressure is reasonably controlled

## 2017-02-14 ENCOUNTER — Encounter: Payer: Self-pay | Admitting: Pulmonary Disease

## 2017-02-14 ENCOUNTER — Ambulatory Visit (INDEPENDENT_AMBULATORY_CARE_PROVIDER_SITE_OTHER): Payer: Medicare Other | Admitting: Pulmonary Disease

## 2017-02-14 VITALS — BP 132/82 | HR 86 | Resp 16 | Ht 72.0 in | Wt 304.0 lb

## 2017-02-14 DIAGNOSIS — G4733 Obstructive sleep apnea (adult) (pediatric): Secondary | ICD-10-CM | POA: Diagnosis not present

## 2017-02-14 NOTE — Patient Instructions (Signed)
Continue CPAP  Weight loss as we discussed Follow up in 12 months or sooner as needed

## 2017-02-16 NOTE — Progress Notes (Signed)
Routine follow up for OSA OSA remains well-controlled. He remains compliant with CPAP. Reports he is "definitely better". Not making much effort at weight loss and weight is up 6-7 lbs since last visit.   CPAP compliance report reviewed: 28/30 days. 25/28 days > 4 hrs  Vitals:   02/14/17 1152  BP: 132/82  Pulse: 86  Resp: 16  SpO2: 95%  Weight: (!) 304 lb (137.9 kg)  Height: 6' (1.829 m)   NAD HEENT WNL No JVD Chest clear Reg, no M Obese, soft, +BS No LE edema  IMPRESSION: Morbid obesity OSA - well controlled  PLAN: Cont CPAP @ current settings Encouraged weight loss and discussed strategies F/U in one year or sooner as needed  Merton Border, MD PCCM service Mobile [phone] Pager [phone]

## 2017-03-18 ENCOUNTER — Ambulatory Visit (INDEPENDENT_AMBULATORY_CARE_PROVIDER_SITE_OTHER): Payer: Medicare Other | Admitting: *Deleted

## 2017-03-18 DIAGNOSIS — I442 Atrioventricular block, complete: Secondary | ICD-10-CM

## 2017-03-18 LAB — CUP PACEART REMOTE DEVICE CHECK
Battery Impedance: 355 Ohm
Battery Remaining Longevity: 104 mo
Battery Voltage: 2.78 V
Brady Statistic AP VP Percent: 20 %
Brady Statistic AP VS Percent: 0 %
Brady Statistic AS VP Percent: 78 %
Brady Statistic AS VS Percent: 2 %
Date Time Interrogation Session: 20180529104733
Implantable Lead Implant Date: 20130416
Implantable Lead Implant Date: 20130416
Implantable Lead Location: 753859
Implantable Lead Location: 753860
Implantable Lead Model: 5076
Implantable Lead Model: 5076
Implantable Pulse Generator Implant Date: 20130416
Lead Channel Impedance Value: 382 Ohm
Lead Channel Impedance Value: 535 Ohm
Lead Channel Pacing Threshold Amplitude: 0.75 V
Lead Channel Pacing Threshold Amplitude: 0.875 V
Lead Channel Pacing Threshold Pulse Width: 0.4 ms
Lead Channel Pacing Threshold Pulse Width: 0.4 ms
Lead Channel Setting Pacing Amplitude: 1.75 V
Lead Channel Setting Pacing Amplitude: 2 V
Lead Channel Setting Pacing Pulse Width: 0.4 ms
Lead Channel Setting Sensing Sensitivity: 4 mV

## 2017-03-18 NOTE — Progress Notes (Signed)
Remote pacemaker transmission.   

## 2017-03-25 ENCOUNTER — Telehealth: Payer: Self-pay | Admitting: Family Medicine

## 2017-03-25 NOTE — Telephone Encounter (Signed)
Called pt to schedule Annual Wellness Visit with Nurse Health Advisor for July:  - knb

## 2017-03-28 ENCOUNTER — Encounter: Payer: Self-pay | Admitting: Cardiology

## 2017-04-28 ENCOUNTER — Telehealth: Payer: Self-pay | Admitting: Family Medicine

## 2017-04-28 NOTE — Telephone Encounter (Signed)
Called pt to schedule Annual Wellness Visit with NHA  - knb  °

## 2017-05-13 ENCOUNTER — Ambulatory Visit (INDEPENDENT_AMBULATORY_CARE_PROVIDER_SITE_OTHER): Payer: Medicare Other | Admitting: Family Medicine

## 2017-05-13 ENCOUNTER — Encounter: Payer: Self-pay | Admitting: Family Medicine

## 2017-05-13 VITALS — BP 133/67 | HR 61 | Temp 98.6°F | Resp 16 | Ht 72.0 in | Wt 305.0 lb

## 2017-05-13 DIAGNOSIS — I4581 Long QT syndrome: Secondary | ICD-10-CM | POA: Diagnosis not present

## 2017-05-13 DIAGNOSIS — E785 Hyperlipidemia, unspecified: Secondary | ICD-10-CM | POA: Diagnosis not present

## 2017-05-13 DIAGNOSIS — Z6841 Body Mass Index (BMI) 40.0 and over, adult: Secondary | ICD-10-CM

## 2017-05-13 DIAGNOSIS — Z1159 Encounter for screening for other viral diseases: Secondary | ICD-10-CM | POA: Diagnosis not present

## 2017-05-13 DIAGNOSIS — I10 Essential (primary) hypertension: Secondary | ICD-10-CM | POA: Diagnosis not present

## 2017-05-13 DIAGNOSIS — R739 Hyperglycemia, unspecified: Secondary | ICD-10-CM

## 2017-05-13 DIAGNOSIS — I442 Atrioventricular block, complete: Secondary | ICD-10-CM | POA: Diagnosis not present

## 2017-05-13 DIAGNOSIS — D696 Thrombocytopenia, unspecified: Secondary | ICD-10-CM

## 2017-05-13 NOTE — Patient Instructions (Addendum)
Thank you for coming to the clinic today.  1. No medication changes  As discussed I recommend a referral to Dr Leafy Ro to discuss weight management further  Most likely you will need to work on improving some form of regular exercise, even if it is 1-2 days a week, when you are not working.  WEIGHT MANAGEMENT  Dr Dennard Nip  [facility] [address], [place] [address] Ph: [phone]  You will be due for FASTING BLOOD WORK (no food or drink after midnight before, only water or coffee without cream/sugar on the morning of)  - Please go ahead and schedule a "Lab Only" visit in the morning at the clinic for lab draw in 5 months (DECEMBER 2018)  - Make sure Lab Only appointment is at least 1-2 weeks before your next appointment, so that results will be available  For Lab Results, once available within 2-3 days of blood draw, you can can log in to MyChart online to view your results and a brief explanation. Also, we can discuss results at next follow-up visit.  Please schedule a Follow-up Appointment to: Return in about 5 months (around 10/13/2017) for Annual Medicare Wellness.  If you have any other questions or concerns, please feel free to call the clinic or send a message through [place]. You may also schedule an earlier appointment if necessary.  Additionally, you may be receiving a survey about your experience at our clinic within a few days to 1 week by e-mail or mail. We value your feedback.  Nobie Putnam, DO [place]

## 2017-05-13 NOTE — Assessment & Plan Note (Signed)
Stable, chronic, s/p pacemaker Followed by Dr Caryl Comes, Cardiology

## 2017-05-13 NOTE — Assessment & Plan Note (Signed)
Prior history mild low plt in low 120s Re-check with upcoming CBC No concerns at this time

## 2017-05-13 NOTE — Assessment & Plan Note (Signed)
Prior A1c normal range, not consistent with PreDM or DM Adjusted diagnosis modifier on chart Check labs with A1c in 5 months

## 2017-05-13 NOTE — Assessment & Plan Note (Signed)
Stable, without complication, on pacemaker Followed by Cardiology Dr Caryl Comes Contraindication to many medications

## 2017-05-13 NOTE — Assessment & Plan Note (Signed)
Mostly stable cholesterol, not on statin, poor lifestyle Last lipid panel 04/2016  Plan: 1. Defer ASCVD risk reduction to Cardiology at this time - awaiting upcoming lipids in 5 months then re-calculate and review 2. Encourage improved lifestyle - low carb/cholesterol, reduce portion size, continue improving regular exercise 3. Follow-up 5 months labs and AMW

## 2017-05-13 NOTE — Assessment & Plan Note (Signed)
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 Recommended referral to [facility] - Dr Dennard Nip for further evaluation and management, for comprehensive approach, as he has tried various diets, lifestyle changes, limited results 2. Check TSH with upcoming labs, prior normal

## 2017-05-13 NOTE — Progress Notes (Signed)
Subjective:    Patient ID: William Woods., male    DOB: [birth year], 71 y.o.   MRN: [id number]  William Woods. is a 71 y.o. male presenting on 05/13/2017 for Hypertension   HPI   History of Elevated Glucose / MORBID OBESITY BMI >41 Reports that he was never diagnosed with Pre-DM or DM. 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 Additionally states former smoker, 4 ppd for 20 years, he was thinner when he smoked, then when quit age 48 he has gradually continued to gain wt also lost son x 2  - Admits his younger brother, had diagnosis of hypothyroidism for weight gain, and started on thyroid med with wt loss 60 lbs, he is asking about his thyroid testing in past, last TSH normal in 2013 and 05/2015 (normal) - Admits fatigue Denies hypoglycemia, polyuria, visual changes, numbness or tingling.  Prolonged QT / Pacemaker with Complete Heart Block / HFpEF Followed by Cardiology Dr Caryl Comes, last visit 11/2015. No significant changes since that visit. Continues with Pacemaker checks.  CHRONIC HTN: Reports no concerns. Not checking BP at home. Current Meds - Losartan 100mg  daily, Amlodipine 2.5mg  daily, Lasix 40mg  daily - prescribed by Cardiology Reports good compliance, took meds today. Tolerating well, w/o complaints. Denies CP, dyspnea, HA, edema, dizziness / lightheadedness  HYPERLIPIDEMIA: - Reports no concerns. Last lipid panel 04/2016, mostly normal but some mild low HDL and elevated LDL - Not on Statin therapy  Health Maintenance: - Due  for TDap and PNeumonia vaccine age >57, he declines both of these despite counseling on benefits  Social History  Substance Use Topics  . Smoking status: Former Smoker    Packs/day: 4.00    Years: 25.00    Types: Cigarettes    Quit date: 02/06/1988  . Smokeless tobacco: Former Systems developer  . Alcohol use 0.0 oz/week     Comment: occassional--special occassions    Review of Systems Per HPI unless specifically indicated above     Objective:    BP 133/67   Pulse 61   Temp 98.6 F (37 C) (Oral)   Resp 16   Ht 6' (1.829 m)   Wt (!) 305 lb (138.3 kg)   SpO2 95%   BMI 41.37 kg/m   Wt Readings from Last 3 Encounters:  05/13/17 (!) 305 lb (138.3 kg)  02/14/17 (!) 304 lb (137.9 kg)  12/17/16 (!) 301 lb (136.5 kg)    Physical Exam  Constitutional: He is oriented to person, place, and time. He appears well-developed and well-nourished. No distress.  Well-appearing, comfortable, cooperative, obese  HENT:  Head: Normocephalic and atraumatic.  Mouth/Throat: Oropharynx is clear and moist.  Eyes: Conjunctivae are normal. Right eye exhibits no discharge. Left eye exhibits no discharge.  Neck: Normal range of motion. Neck supple. No thyromegaly present.  Cardiovascular: Normal rate, regular rhythm, normal heart sounds and intact distal pulses.   No murmur heard. Pulmonary/Chest: Effort normal and breath sounds normal. No respiratory distress. He has no wheezes. He has no rales.  Musculoskeletal: Normal range of motion. He exhibits edema (Mild +1 bilateral lower extremity and ankle pitting edema, symmetrical).  Lymphadenopathy:    He has no cervical adenopathy.  Neurological: He is alert and oriented to person, place, and time.  Skin: Skin is warm and dry. No rash noted. He is not diaphoretic. No erythema.  Slight changes of chronic venous stasis skin on lower legs  Psychiatric: He has a normal mood and affect. His behavior is normal.  Well groomed, good eye contact, normal speech and  thoughts  Nursing note and vitals reviewed.     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 Recommended referral to [facility] - Dr Dennard Nip for further evaluation and management, for comprehensive approach, as he has tried various diets, lifestyle changes, limited results 2. Check TSH with upcoming labs, prior normal      Relevant Orders   COMPLETE METABOLIC PANEL WITH GFR   TSH   Long Q-T syndrome    Stable, without complication, on pacemaker Followed by Cardiology Dr Caryl Comes Contraindication to many medications      Hypertension    Well-controlled HTN - Home BP readings none No known complications - except CHB   Plan:  1. Continue current BP regimen per Cardiology - Losartan 100mg , Amlodipine 2.5mg , Lasix 40mg  2. Encourage improved lifestyle - low sodium diet, needs to improve regular exercise 3. Start monitor BP outside office, bring readings to next visit, if persistently >140/90 or new symptoms notify office sooner 4. Follow-up yearly - can follow-up in 5-6 months for AMW and labs      Elevated blood sugar    Prior A1c normal range, not consistent with PreDM or DM Adjusted diagnosis modifier on chart Check labs with A1c in 5 months      Relevant Orders   Hemoglobin A1c   Dyslipidemia (high LDL; low HDL)    Mostly stable cholesterol, not on statin, poor lifestyle Last lipid panel 04/2016  Plan: 1. Defer ASCVD risk reduction to Cardiology at  this time - awaiting upcoming lipids in 5 months then re-calculate and review 2. Encourage improved lifestyle - low carb/cholesterol, reduce portion size, continue improving regular exercise 3. Follow-up 5 months labs and AMW      Relevant Orders   Lipid panel   TSH   Complete heart block ([facility])    Stable, chronic, s/p pacemaker Followed by Dr Caryl Comes, Cardiology       Other Visit Diagnoses    Need for hepatitis C screening test       Relevant Orders   Hepatitis C antibody      No orders of the defined types were placed in this encounter.    Follow up plan: Return in about 5 months (around 10/13/2017) for Annual Medicare Wellness.  Nobie Putnam, [facility] 05/13/2017, 8:27 PM

## 2017-05-13 NOTE — Assessment & Plan Note (Signed)
Well-controlled HTN - Home BP readings none No known complications - except CHB   Plan:  1. Continue current BP regimen per Cardiology - Losartan 100mg , Amlodipine 2.5mg , Lasix 40mg  2. Encourage improved lifestyle - low sodium diet, needs to improve regular exercise 3. Start monitor BP outside office, bring readings to next visit, if persistently >140/90 or new symptoms notify office sooner 4. Follow-up yearly - can follow-up in 5-6 months for AMW and labs

## 2017-06-17 ENCOUNTER — Encounter: Payer: Medicare Other | Admitting: *Deleted

## 2017-06-18 ENCOUNTER — Telehealth: Payer: Self-pay | Admitting: Internal Medicine

## 2017-06-18 NOTE — Telephone Encounter (Signed)
LMTCB/sss  Transmission was not received.

## 2017-06-18 NOTE — Telephone Encounter (Signed)
Pt wife called to see if we received remote check yesterday  Please call back

## 2017-06-18 NOTE — Telephone Encounter (Signed)
Wife returned call. 800# provided.

## 2017-06-20 ENCOUNTER — Encounter: Payer: Self-pay | Admitting: Cardiology

## 2017-06-30 ENCOUNTER — Ambulatory Visit (INDEPENDENT_AMBULATORY_CARE_PROVIDER_SITE_OTHER): Payer: Medicare Other | Admitting: *Deleted

## 2017-06-30 DIAGNOSIS — I442 Atrioventricular block, complete: Secondary | ICD-10-CM

## 2017-06-30 NOTE — Progress Notes (Signed)
Remote pacemaker transmission.   

## 2017-07-01 LAB — CUP PACEART REMOTE DEVICE CHECK
Battery Impedance: 380 Ohm
Battery Remaining Longevity: 99 mo
Battery Voltage: 2.78 V
Brady Statistic AP VP Percent: 25 %
Brady Statistic AP VS Percent: 0 %
Brady Statistic AS VP Percent: 73 %
Brady Statistic AS VS Percent: 2 %
Date Time Interrogation Session: 20180909231947
Implantable Lead Implant Date: 20130416
Implantable Lead Implant Date: 20130416
Implantable Lead Location: 753859
Implantable Lead Location: 753860
Implantable Lead Model: 5076
Implantable Lead Model: 5076
Implantable Pulse Generator Implant Date: 20130416
Lead Channel Impedance Value: 367 Ohm
Lead Channel Impedance Value: 446 Ohm
Lead Channel Pacing Threshold Amplitude: 0.625 V
Lead Channel Pacing Threshold Amplitude: 0.75 V
Lead Channel Pacing Threshold Pulse Width: 0.4 ms
Lead Channel Pacing Threshold Pulse Width: 0.4 ms
Lead Channel Setting Pacing Amplitude: 1.5 V
Lead Channel Setting Pacing Amplitude: 2 V
Lead Channel Setting Pacing Pulse Width: 0.4 ms
Lead Channel Setting Sensing Sensitivity: 4 mV

## 2017-07-02 ENCOUNTER — Encounter: Payer: Self-pay | Admitting: Cardiology

## 2017-07-08 ENCOUNTER — Telehealth: Payer: Self-pay

## 2017-07-08 NOTE — Telephone Encounter (Signed)
Spoke with William Woods informed her that transmission was received.

## 2017-07-08 NOTE — Telephone Encounter (Signed)
Received voice mail message from wife asking if patients remote transmission was received last week.  Message forwarded to device clinic for follow up.  She can be reached at [phone].

## 2017-09-23 ENCOUNTER — Ambulatory Visit: Payer: Medicare Other

## 2017-09-23 ENCOUNTER — Other Ambulatory Visit: Payer: Medicare Other

## 2017-09-23 ENCOUNTER — Ambulatory Visit (INDEPENDENT_AMBULATORY_CARE_PROVIDER_SITE_OTHER): Payer: Medicare Other

## 2017-09-23 VITALS — BP 132/72 | HR 74 | Temp 98.0°F | Resp 17 | Ht 72.0 in | Wt 304.4 lb

## 2017-09-23 DIAGNOSIS — D696 Thrombocytopenia, unspecified: Secondary | ICD-10-CM | POA: Diagnosis not present

## 2017-09-23 DIAGNOSIS — Z6841 Body Mass Index (BMI) 40.0 and over, adult: Secondary | ICD-10-CM

## 2017-09-23 DIAGNOSIS — Z1159 Encounter for screening for other viral diseases: Secondary | ICD-10-CM | POA: Diagnosis not present

## 2017-09-23 DIAGNOSIS — E785 Hyperlipidemia, unspecified: Secondary | ICD-10-CM

## 2017-09-23 DIAGNOSIS — R739 Hyperglycemia, unspecified: Secondary | ICD-10-CM | POA: Diagnosis not present

## 2017-09-23 DIAGNOSIS — Z0000 Encounter for general adult medical examination without abnormal findings: Secondary | ICD-10-CM | POA: Diagnosis not present

## 2017-09-23 NOTE — Patient Instructions (Addendum)
William Woods , Thank you for taking time to come for your Medicare Wellness Visit. I appreciate your ongoing commitment to your health goals. Please review the following plan we discussed and let me know if I can assist you in the future.   Screening recommendations/referrals: Colonoscopy: declined Recommended yearly ophthalmology/optometry visit for glaucoma screening and checkup Recommended yearly dental visit for hygiene and checkup  Vaccinations: Influenza vaccine: due now- declined Pneumococcal vaccine: due now- declined Tdap vaccine: up to date Shingles vaccine: due, check with your insurance company for coverage  Advanced directives: Please bring a copy of your health care power of attorney and living will to the office at your convenience.  Conditions/risks identified: Recommend drinking at least 6-8 glasses of water a day  Next appointment: Follow up in one year for your annual wellness exam.   Preventive Care 65 Years and Older, Male Preventive care refers to lifestyle choices and visits with your health care provider that can promote health and wellness. What does preventive care include?  A yearly physical exam. This is also called an annual well check.  Dental exams once or twice a year.  Routine eye exams. Ask your health care provider how often you should have your eyes checked.  Personal lifestyle choices, including:  Daily care of your teeth and gums.  Regular physical activity.  Eating a healthy diet.  Avoiding tobacco and drug use.  Limiting alcohol use.  Practicing safe sex.  Taking low doses of aspirin every day.  Taking vitamin and mineral supplements as recommended by your health care provider. What happens during an annual well check? The services and screenings done by your health care provider during your annual well check will depend on your age, overall health, lifestyle risk factors, and family history of disease. Counseling  Your health  care provider may ask you questions about your:  Alcohol use.  Tobacco use.  Drug use.  Emotional well-being.  Home and relationship well-being.  Sexual activity.  Eating habits.  History of falls.  Memory and ability to understand (cognition).  Work and work Statistician. Screening  You may have the following tests or measurements:  Height, weight, and BMI.  Blood pressure.  Lipid and cholesterol levels. These may be checked every 5 years, or more frequently if you are over 3 years old.  Skin check.  Lung cancer screening. You may have this screening every year starting at age 35 if you have a 30-pack-year history of smoking and currently smoke or have quit within the past 15 years.  Fecal occult blood test (FOBT) of the stool. You may have this test every year starting at age 39.  Flexible sigmoidoscopy or colonoscopy. You may have a sigmoidoscopy every 5 years or a colonoscopy every 10 years starting at age 43.  Prostate cancer screening. Recommendations will vary depending on your family history and other risks.  Hepatitis C blood test.  Hepatitis B blood test.  Sexually transmitted disease (STD) testing.  Diabetes screening. This is done by checking your blood sugar (glucose) after you have not eaten for a while (fasting). You may have this done every 1-3 years.  Abdominal aortic aneurysm (AAA) screening. You may need this if you are a current or former smoker.  Osteoporosis. You may be screened starting at age 77 if you are at high risk. Talk with your health care provider about your test results, treatment options, and if necessary, the need for more tests. Vaccines  Your health care provider may  recommend certain vaccines, such as:  Influenza vaccine. This is recommended every year.  Tetanus, diphtheria, and acellular pertussis (Tdap, Td) vaccine. You may need a Td booster every 10 years.  Zoster vaccine. You may need this after age  6.  Pneumococcal 13-valent conjugate (PCV13) vaccine. One dose is recommended after age 52.  Pneumococcal polysaccharide (PPSV23) vaccine. One dose is recommended after age 79. Talk to your health care provider about which screenings and vaccines you need and how often you need them. This information is not intended to replace advice given to you by your health care provider. Make sure you discuss any questions you have with your health care provider. Document Released: 11/03/2015 Document Revised: 06/26/2016 Document Reviewed: 08/08/2015 Elsevier Interactive Patient Education  2017 [place] Prevention in the Home Falls can cause injuries. They can happen to people of all ages. There are many things you can do to make your home safe and to help prevent falls. What can I do on the outside of my home?  Regularly fix the edges of walkways and driveways and fix any cracks.  Remove anything that might make you trip as you walk through a door, such as a raised step or threshold.  Trim any bushes or trees on the path to your home.  Use bright outdoor lighting.  Clear any walking paths of anything that might make someone trip, such as rocks or tools.  Regularly check to see if handrails are loose or broken. Make sure that both sides of any steps have handrails.  Any raised decks and porches should have guardrails on the edges.  Have any leaves, snow, or ice cleared regularly.  Use sand or salt on walking paths during winter.  Clean up any spills in your garage right away. This includes oil or grease spills. What can I do in the bathroom?  Use night lights.  Install grab bars by the toilet and in the tub and shower. Do not use towel bars as grab bars.  Use non-skid mats or decals in the tub or shower.  If you need to sit down in the shower, use a plastic, non-slip stool.  Keep the floor dry. Clean up any water that spills on the floor as soon as it happens.  Remove  soap buildup in the tub or shower regularly.  Attach bath mats securely with double-sided non-slip rug tape.  Do not have throw rugs and other things on the floor that can make you trip. What can I do in the bedroom?  Use night lights.  Make sure that you have a light by your bed that is easy to reach.  Do not use any sheets or blankets that are too big for your bed. They should not hang down onto the floor.  Have a firm chair that has side arms. You can use this for support while you get dressed.  Do not have throw rugs and other things on the floor that can make you trip. What can I do in the kitchen?  Clean up any spills right away.  Avoid walking on wet floors.  Keep items that you use a lot in easy-to-reach places.  If you need to reach something above you, use a strong step stool that has a grab bar.  Keep electrical cords out of the way.  Do not use floor polish or wax that makes floors slippery. If you must use wax, use non-skid floor wax.  Do not have throw rugs and  other things on the floor that can make you trip. What can I do with my stairs?  Do not leave any items on the stairs.  Make sure that there are handrails on both sides of the stairs and use them. Fix handrails that are broken or loose. Make sure that handrails are as long as the stairways.  Check any carpeting to make sure that it is firmly attached to the stairs. Fix any carpet that is loose or worn.  Avoid having throw rugs at the top or bottom of the stairs. If you do have throw rugs, attach them to the floor with carpet tape.  Make sure that you have a light switch at the top of the stairs and the bottom of the stairs. If you do not have them, ask someone to add them for you. What else can I do to help prevent falls?  Wear shoes that:  Do not have high heels.  Have rubber bottoms.  Are comfortable and fit you well.  Are closed at the toe. Do not wear sandals.  If you use a  stepladder:  Make sure that it is fully opened. Do not climb a closed stepladder.  Make sure that both sides of the stepladder are locked into place.  Ask someone to hold it for you, if possible.  Clearly mark and make sure that you can see:  Any grab bars or handrails.  First and last steps.  Where the edge of each step is.  Use tools that help you move around (mobility aids) if they are needed. These include:  Canes.  Walkers.  Scooters.  Crutches.  Turn on the lights when you go into a dark area. Replace any light bulbs as soon as they burn out.  Set up your furniture so you have a clear path. Avoid moving your furniture around.  If any of your floors are uneven, fix them.  If there are any pets around you, be aware of where they are.  Review your medicines with your doctor. Some medicines can make you feel dizzy. This can increase your chance of falling. Ask your doctor what other things that you can do to help prevent falls. This information is not intended to replace advice given to you by your health care provider. Make sure you discuss any questions you have with your health care provider. Document Released: 08/03/2009 Document Revised: 03/14/2016 Document Reviewed: 11/11/2014 Elsevier Interactive Patient Education  2017 [employer].

## 2017-09-23 NOTE — Progress Notes (Signed)
Subjective:   William Woods. is a 71 y.o. male who presents for Medicare Annual/Subsequent preventive examination.  Review of Systems:   Cardiac Risk Factors include: hypertension;advanced age (>45men, >52 women);male gender;obesity (BMI >30kg/m2);dyslipidemia     Objective:    Vitals: BP 132/72 (BP Location: Left Arm, Patient Position: Sitting)   Pulse 74   Temp 98 F (36.7 C) (Oral)   Resp 17   Ht 6' (1.829 m)   Wt (!) 304 lb 6.4 oz (138.1 kg)   BMI 41.28 kg/m   Body mass index is 41.28 kg/m.  Advanced Directives 09/23/2017 02/05/2012  Does Patient Have a Medical Advance Directive? Yes Patient does not have advance directive  Type of Advance Directive [name];Living will -  Copy of [place] in Chart? No - copy requested -    Tobacco Social History   Tobacco Use  Smoking Status Former Smoker  . Packs/day: 4.00  . Years: 25.00  . Pack years: 100.00  . Types: Cigarettes  . Last attempt to quit: 02/06/1988  . Years since quitting: 29.6  Smokeless Tobacco Former Engineer, structural given: Not Answered   Clinical Intake:  Pre-visit preparation completed: Yes  Pain : No/denies pain     Nutritional Status: BMI > 30  Obese Nutritional Risks: None Diabetes: No  Activities of Daily Living: Independent Ambulation: Independent with device- listed below Home Assistive Devices/Equipment: Eyeglasses Medication Administration: Independent Home Management: Independent  Barriers to Care Management & Learning: Literacy level  Do you feel unsafe in your current relationship?: No Do you feel physically threatened by others?: No Anyone hurting you at home, work, or school?: No Unable to ask?: No  How often do you need to have someone help you when you read instructions, pamphlets, or other written materials from your doctor or pharmacy?: 1 - Never What is the last grade level you completed in school?: 10th  grade  Interpreter Needed?: No  Information entered by :: Tiffany Hill,LPN  Past Medical History:  Diagnosis Date  . CHB (complete heart block) ([place])   . Edema    Suspect diastolic heart failure  . Hypertension   . Long Q-T syndrome   . Obesity (BMI 30-39.9)   . Obstructive sleep apnea   . Pacemaker    Medtronic Adapta L model ADDRL 1 (serial number [id number]) pacemaker.  . Syncope   . Thyroid disease    Past Surgical History:  Procedure Laterality Date  . CARDIAC CATHETERIZATION     [facility]   . CARDIAC CATHETERIZATION  01/2012   [facility]: no significant CAD, EF 40%.   Marland Kitchen LOOP RECORDER EXPLANT N/A 02/04/2012   Procedure: LOOP RECORDER EXPLANT;  Surgeon: Thompson Grayer, MD;  [facility] C[facility] LAB;  Service: Cardiovascular;  Laterality: N/A;  . LOOP RECORDER IMPLANT N/A 01/21/2012   Procedure: LOOP RECORDER IMPLANT;  Surgeon: Deboraha Sprang, MD;  Location[facility] CATH LAB;  Service: Cardiovascular;  Laterality: N/A;  . OTHER SURGICAL HISTORY  02/04/2012    removal of a previously implanted MDT Reveal XT implantable loop recorder  . PACEMAKER INSERTION  02/04/2012   Medtronic Adapta L model ADDRL 1 (serial number [id number]) pacemaker.  Marland Kitchen PERMANENT PACEMAKER INSERTION N/A 02/04/2012   Procedure: PERMANENT PACEMAKER INSERTION;  Surgeon: Thompson Grayer, MD;  Location: [facility] CATH LAB;  Service: Cardiovascular;  Laterality: N/A;   Family History  Problem Relation Age of Onset  . Kidney disease Father  long QT disease  . Cancer Mother        leukemia  . Bone cancer Son        deceased   Social History   Socioeconomic History  . Marital status: Married    Spouse name: None  . Number of children: None  . Years of education: 89  . Highest education level: 10th grade  Social Needs  . Financial resource strain: Not hard at all  . Food insecurity - worry: Never true  . Food insecurity - inability: Never true  . Transportation needs - medical: No  . Transportation needs - non-medical: No   Occupational History  . Occupation: Medical illustrator: [employer]  Tobacco Use  . Smoking status: Former Smoker    Packs/day: 4.00    Years: 25.00    Pack years: 100.00    Types: Cigarettes    Last attempt to quit: 02/06/1988    Years since quitting: 29.6  . Smokeless tobacco: Former Network engineer and Sexual Activity  . Alcohol use: Yes    Alcohol/week: 0.0 oz    Comment: occassional--special occassions  . Drug use: No  . Sexual activity: None  Other Topics Concern  . None  Social History Narrative   Lives in [place], with wife. No children, 2 sons deceased. No pets.      Pt owns [employer]. Quit tobacco at age 81. He lives with his wife. Pt is not active..the patient father had a history of long QT and syncope and died at age 64. His mother died at age 74 of cancer.     Outpatient Encounter Medications as of 09/23/2017  Medication Sig  . amLODipine (NORVASC) 2.5 MG tablet Take one tablet (2.5 mg) by mouth once daily at bedtime  . furosemide (LASIX) 40 MG tablet Take 1 tablet (40 mg total) by mouth daily.  Marland Kitchen losartan (COZAAR) 100 MG tablet Take one tablet (100 mg) by mouth once daily in the morning   No facility-administered encounter medications on file as of 09/23/2017.     Activities of Daily Living In your present state of health, do you have any difficulty performing the following activities: 09/23/2017 11/11/2016  Hearing? N N  Vision? N N  Difficulty concentrating or making decisions? N N  Walking or climbing stairs? N N  Dressing or bathing? N N  Doing errands, shopping? N N  Preparing Food and eating ? N -  Using the Toilet? N -  In the past six months, have you accidently leaked urine? N -  Do you have problems with loss of bowel control? N -  Managing your Medications? N -  Managing your Finances? N -  Housekeeping or managing your Housekeeping? N -  Some recent data might be hidden    Timed Get Up and  Go Performed: completed in 8 seconds with no assistive devices.   Patient Care Team: [name], DO as PCP - General (Family Medicine)   Assessment:     Exercise Activities and Dietary recommendations Current Exercise Habits: The patient does not participate in regular exercise at present, Exercise limited by: None identified  Goals    . DIET - INCREASE WATER INTAKE     Recommend drinking at least 6-8 glasses of water a day      Fall Risk Fall Risk  09/23/2017 11/11/2016 02/20/2016 05/31/2015  Falls in the past year? No Yes No No  Number falls  in past yr: - 1 - -  Injury with Fall? - No - -   Is the patient's home free of loose throw rugs in walkways, pet beds, electrical cords, etc?   no      Grab bars in the bathroom? yes      Handrails on the stairs?   yes      Adequate lighting?   yes Depression Screen PHQ 2/9 Scores 09/23/2017 11/11/2016 02/20/2016 05/31/2015  PHQ - 2 Score 0 0 0 0    Cognitive Function     6CIT Screen 09/23/2017  What Year? 0 points  What month? 0 points  What time? 0 points  Count back from 20 0 points  Months in reverse 0 points  Repeat phrase 0 points  Total Score 0     There is no immunization history on file for this patient. Screening Tests Health Maintenance  Topic Date Due  . PNA vac Low Risk Adult (1 of 2 - PCV13) 10/17/2017 (Originally 02/05/2013)  . INFLUENZA VACCINE  05/26/2018 (Originally 05/21/2017)  . TETANUS/TDAP  10/21/2019 (Originally 02/06/1967)  . COLONOSCOPY  08/03/2022  . Hepatitis C Screening  Completed   Cancer Screenings: Lung:  Low Dose CT Chest recommended if -Age 30-80 years, 30 pack-year currently smoking OR have quit w/in 15 years. Patient does qualify. - declined Colorectal: declined  Additional Screenings:  Hepatitis B/HIV/Syphillis: not indicated  Hepatitis C Screening: completed today     Plan:     I have personally reviewed and addressed the Medicare Annual Wellness questionnaire and have noted  the following in the patient's chart:  A. Medical and social history B. Use of alcohol, tobacco or illicit drugs  C. Current medications and supplements D. Functional ability and status E.  Nutritional status F.  Physical activity G. Advance directives H. List of other physicians I.  Hospitalizations, surgeries, and ER visits in previous 12 months J.  [place] such as hearing and vision if needed, cognitive and depression L. Referrals and appointments   In addition, I have reviewed and discussed with patient certain preventive protocols, quality metrics, and best practice recommendations. A written personalized care plan for preventive services as well as general preventive health recommendations were provided to patient.   Signed,  Tyler Aas, LPN Nurse Health Advisor   Nurse Notes:none

## 2017-09-24 LAB — COMPLETE METABOLIC PANEL WITH GFR
AG Ratio: 1.3 (calc) (ref 1.0–2.5)
ALT: 13 U/L (ref 9–46)
AST: 18 U/L (ref 10–35)
Albumin: 3.9 g/dL (ref 3.6–5.1)
Alkaline phosphatase (APISO): 71 U/L (ref 40–115)
BUN: 21 mg/dL (ref 7–25)
CO2: 29 mmol/L (ref 20–32)
Calcium: 9.2 mg/dL (ref 8.6–10.3)
Chloride: 105 mmol/L (ref 98–110)
Creat: 1.13 mg/dL (ref 0.70–1.25)
GFR, Est African American: 76 mL/min/{1.73_m2} (ref >=60)
GFR, Est Non African American: 66 mL/min/{1.73_m2} (ref >=60)
Globulin: 3 g/dL (calc) (ref 1.9–3.7)
Glucose, Bld: 97 mg/dL (ref 65–139)
Potassium: 4.3 mmol/L (ref 3.5–5.3)
Sodium: 141 mmol/L (ref 135–146)
Total Bilirubin: 0.7 mg/dL (ref 0.2–1.2)
Total Protein: 6.9 g/dL (ref 6.1–8.1)

## 2017-09-24 LAB — CBC WITH DIFFERENTIAL/PLATELET
Basophils Absolute: 43 cells/uL (ref 0–200)
Basophils Relative: 0.7 %
Eosinophils Absolute: 183 cells/uL (ref 15–500)
Eosinophils Relative: 3 %
HCT: 43.1 % (ref 38.5–50.0)
Hemoglobin: 14.5 g/dL (ref 13.2–17.1)
Lymphs Abs: 1275 cells/uL (ref 850–3900)
MCH: 26.8 pg — ABNORMAL LOW (ref 27.0–33.0)
MCHC: 33.6 g/dL (ref 32.0–36.0)
MCV: 79.5 fL — ABNORMAL LOW (ref 80.0–100.0)
MPV: 11.4 fL (ref 7.5–12.5)
Monocytes Relative: 9.1 %
Neutro Abs: 4044 cells/uL (ref 1500–7800)
Neutrophils Relative %: 66.3 %
Platelets: 110 10*3/uL — ABNORMAL LOW (ref 140–400)
RBC: 5.42 10*6/uL (ref 4.20–5.80)
RDW: 13 % (ref 11.0–15.0)
Total Lymphocyte: 20.9 %
WBC mixed population: 555 cells/uL (ref 200–950)
WBC: 6.1 10*3/uL (ref 3.8–10.8)

## 2017-09-24 LAB — HEPATITIS C ANTIBODY
Hepatitis C Ab: NONREACTIVE
SIGNAL TO CUT-OFF: 0.07 (ref <1.00)

## 2017-09-24 LAB — LIPID PANEL
Cholesterol: 176 mg/dL (ref <200)
HDL: 41 mg/dL (ref >40)
LDL Cholesterol (Calc): 109 mg/dL (calc) — ABNORMAL HIGH
Non-HDL Cholesterol (Calc): 135 mg/dL (calc) — ABNORMAL HIGH (ref <130)
Total CHOL/HDL Ratio: 4.3 (calc) (ref <5.0)
Triglycerides: 147 mg/dL (ref <150)

## 2017-09-24 LAB — HEMOGLOBIN A1C
Hgb A1c MFr Bld: 5.4 % of total Hgb (ref <5.7)
Mean Plasma Glucose: 108 (calc)
eAG (mmol/L): 6 (calc)

## 2017-09-24 LAB — TSH: TSH: 1.57 mIU/L (ref 0.40–4.50)

## 2017-09-25 ENCOUNTER — Encounter: Payer: Self-pay | Admitting: Family Medicine

## 2017-09-25 ENCOUNTER — Ambulatory Visit (INDEPENDENT_AMBULATORY_CARE_PROVIDER_SITE_OTHER): Payer: Medicare Other | Admitting: Family Medicine

## 2017-09-25 VITALS — BP 125/65 | HR 76 | Temp 97.6°F | Resp 16 | Ht 72.0 in | Wt 308.0 lb

## 2017-09-25 DIAGNOSIS — Z0000 Encounter for general adult medical examination without abnormal findings: Secondary | ICD-10-CM

## 2017-09-25 DIAGNOSIS — E785 Hyperlipidemia, unspecified: Secondary | ICD-10-CM | POA: Diagnosis not present

## 2017-09-25 DIAGNOSIS — Z6841 Body Mass Index (BMI) 40.0 and over, adult: Secondary | ICD-10-CM

## 2017-09-25 DIAGNOSIS — I4581 Long QT syndrome: Secondary | ICD-10-CM | POA: Diagnosis not present

## 2017-09-25 DIAGNOSIS — D696 Thrombocytopenia, unspecified: Secondary | ICD-10-CM | POA: Diagnosis not present

## 2017-09-25 DIAGNOSIS — I10 Essential (primary) hypertension: Secondary | ICD-10-CM | POA: Diagnosis not present

## 2017-09-25 MED ORDER — LOSARTAN POTASSIUM 100 MG PO TABS: ORAL_TABLET | ORAL | 3 refills | Status: DC

## 2017-09-25 MED ORDER — FUROSEMIDE 40 MG PO TABS: 40.0000 mg | ORAL_TABLET | Freq: Every day | ORAL | 3 refills | Status: DC

## 2017-09-25 MED ORDER — AMLODIPINE BESYLATE 2.5 MG PO TABS: ORAL_TABLET | ORAL | 3 refills | Status: DC

## 2017-09-25 NOTE — Assessment & Plan Note (Signed)
Stable, without complication, on pacemaker Followed by Cardiology Dr Caryl Comes

## 2017-09-25 NOTE — Progress Notes (Signed)
Subjective:    Patient ID: William Woods., male    DOB: 19-Apr-1946, 71 y.o.   MRN: [id number]  William Woods. is a 71 y.o. male presenting on 09/25/2017 for Annual Exam   HPI   Here for Annual Exam and Lab Review.  Thrombocytopenia Reports not aware of chronic history low plt, review of labs shows chronic problem. He has no known bleeding problem except does think bleeding slightly longer than used to if accidental cut while shaving. No easy bruising. No blood in stool or urine.  History of Elevated Glucose / MORBID OBESITY BMI >41 Reports that he was never diagnosed with Pre-DM or DM. 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 No significant changes since that visit. Continues with Pacemaker checks. - Regarding his cardiovascular risk, states last cardiac cath 7-8 year ago was told "mostly clear arteries" no known blockages by his report. Has hereditary family Prolong QT  CHRONIC HTN: Reports no concerns. Not checking BP at home. Current Meds - Losartan 100mg  daily, Amlodipine 2.5mg  daily, Lasix 40mg  daily Reports good compliance, took meds today. Tolerating well, w/o complaints. - Requesting med refills  HYPERLIPIDEMIA: - Reports no concerns. Last lipid panel 09/2017, mostly normal with borderline elevated LDL 109 - Not on Statin therapy. He is not interested in these meds - Not taking ASA  OSA, on CPAP - Patient reports prior history of dx OSA and on CPAP for >3-5 years, prior to treatment initial symptoms were snoring, daytime sleepiness and fatigue. Most recent PSG through [facility] in 2014 per Dr Caryl Comes - Today  reports that sleep apnea is well controlled. He uses the CPAP machine almost every night. Tolerates the machine well, and thinks that sleeps better with it and feels good. No new concerns or symptoms - Medical supplier for CPAP equipment is [employer] [facility] Maintenance: - Due for Flu Shot, declines today despite counseling on benefits - Due for TDap and PNeumonia vaccine age >37, he declines both of these despite counseling on benefits  Prostate CA Screening: Prior PSA / DRE reported normal. Last PSA 1.6 (07/2012). Currently asymptomatic No known family history of prostate CA. Due for screening but he is adamantly opposed to PSA testing, states it is "inaccurate" and would not like to proceed despite counseling.   Depression screen [facility] 2/9 09/25/2017 09/23/2017 11/11/2016  Decreased Interest 0 0 0  Down, Depressed, Hopeless 0 0 0  PHQ - 2 Score 0 0 0    Past Medical History:  Diagnosis Date  . CHB (complete heart block) ([place])   . Edema    Suspect diastolic heart failure  . Hypertension   . Long Q-T syndrome   . Obesity (BMI 30-39.9)   . Obstructive sleep apnea   . Pacemaker    Medtronic Adapta L model ADDRL 1 (serial number [id number]) pacemaker.  . Syncope   . Thyroid disease    Past Surgical History:  Procedure Laterality Date  . CARDIAC CATHETERIZATION     [facility]   . CARDIAC CATHETERIZATION  01/2012   [facility]: no significant CAD, EF 40%.   Marland Kitchen LOOP RECORDER EXPLANT N/A 02/04/2012  Procedure: LOOP RECORDER EXPLANT;  Surgeon: Thompson Grayer, MD;  Location[facility] C[facility] LAB;  Service: Cardiovascular;  Laterality: N/A;  . LOOP RECORDER IMPLANT N/A 01/21/2012   Procedure: LOOP RECORDER IMPLANT;  Surgeon: Deboraha Sprang, MD;  Location: [facility] C[facility] LAB;  Service: Cardiovascular;  Laterality: N/A;  . OTHER SURGICAL HISTORY  02/04/2012    removal of a previously implanted MDT Reveal XT implantable loop recorder  . PACEMAKER INSERTION  02/04/2012   Medtronic Adapta L model ADDRL 1 (serial  number [id number]) pacemaker.  Marland Kitchen PERMANENT PACEMAKER INSERTION N/A 02/04/2012   Procedure: PERMANENT PACEMAKER INSERTION;  Surgeon: Thompson Grayer, MD;  Location: [facility] CATH LAB;  Service: Cardiovascular;  Laterality: N/A;   Social History   Socioeconomic History  . Marital status: Married    Spouse name: Not on file  . Number of children: Not on file  . Years of education: 13  . Highest education level: 10th grade  Social Needs  . Financial resource strain: Not hard at all  . Food insecurity - worry: Never true  . Food insecurity - inability: Never true  . Transportation needs - medical: No  . Transportation needs - non-medical: No  Occupational History  . Occupation: Medical illustrator: [employer]  Tobacco Use  . Smoking status: Former Smoker    Packs/day: 4.00    Years: 25.00    Pack years: 100.00    Types: Cigarettes    Last attempt to quit: 02/06/1988    Years since quitting: 29.6  . Smokeless tobacco: Former Network engineer and Sexual Activity  . Alcohol use: Yes    Alcohol/week: 0.0 oz    Comment: occassional--special occassions  . Drug use: No  . Sexual activity: Not on file  Other Topics Concern  . Not on file  Social History Narrative   Lives in [place], with wife. No children, 2 sons deceased. No pets.      Pt owns [employer]. Quit tobacco at age 13. He lives with his wife. Pt is not active..the patient father had a history of long QT and syncope and died at age 80. His mother died at age 86 of cancer.    Family History  Problem Relation Age of Onset  . Kidney disease Father        long QT disease  . Arrhythmia Father        Prolong QT  . Cancer Mother        leukemia  . Arrhythmia Brother        Prolong QT  . Bone cancer Son        deceased   Current Outpatient Medications on File Prior to Visit  Medication Sig  . Ascorbic Acid (VITAMIN C) 1000 MG tablet Take 1,000 mg by mouth 2 (two) times daily.  .  magnesium oxide (MAG-OX) 400 MG tablet Take 400 mg by mouth daily.   No current facility-administered medications on file prior to visit.     Review of Systems  Constitutional: Negative for activity change, appetite change, chills, diaphoresis, fatigue, fever and unexpected weight change.  HENT: Negative for congestion and hearing loss.   Eyes: Negative for visual disturbance.  Respiratory: Negative for apnea, cough, choking, chest tightness, shortness of breath and wheezing.   Cardiovascular: Negative for chest pain, palpitations and leg swelling.  Gastrointestinal: Negative for abdominal distention, abdominal pain, anal bleeding, blood in stool, constipation, diarrhea, nausea and vomiting.  Endocrine: Negative for  cold intolerance and polyuria.  Genitourinary: Negative for decreased urine volume, difficulty urinating, dysuria, frequency, hematuria, testicular pain and urgency.  Musculoskeletal: Negative for arthralgias and neck pain.  Skin: Negative for rash.  Allergic/Immunologic: Negative for environmental allergies.  Neurological: Negative for dizziness, weakness, light-headedness, numbness and headaches.  Hematological: Negative for adenopathy.  Psychiatric/Behavioral: Negative for behavioral problems, dysphoric mood and sleep disturbance. The patient is not nervous/anxious.    Per HPI unless specifically indicated above     Objective:    BP 125/65   Pulse 76   Temp 97.6 F (36.4 C) (Oral)   Resp 16   Ht 6' (1.829 m)   Wt (!) 308 lb (139.7 kg)   BMI 41.77 kg/m   Wt Readings from Last 3 Encounters:  09/25/17 (!) 308 lb (139.7 kg)  09/23/17 (!) 304 lb 6.4 oz (138.1 kg)  05/13/17 (!) 305 lb (138.3 kg)    Physical Exam  Constitutional: He is oriented to person, place, and time. He appears well-developed and well-nourished. No distress.  Well-appearing, comfortable, cooperative, obese  HENT:  Head: Normocephalic and atraumatic.  Mouth/Throat: Oropharynx is clear and  moist.  Eyes: Conjunctivae and EOM are normal. Pupils are equal, round, and reactive to light. Right eye exhibits no discharge. Left eye exhibits no discharge.  Neck: Normal range of motion. Neck supple. No thyromegaly present.  Cardiovascular: Normal rate, regular rhythm, normal heart sounds and intact distal pulses.  No murmur heard. Pulmonary/Chest: Effort normal and breath sounds normal. No respiratory distress. He has no wheezes. He has no rales.  Abdominal: Soft. Bowel sounds are normal. He exhibits no distension and no mass. There is no tenderness.  Genitourinary:  Genitourinary Comments: Declined DRE  Musculoskeletal: Normal range of motion. He exhibits no edema or tenderness.  Upper / Lower Extremities: - Normal muscle tone, strength bilateral upper extremities 5/5, lower extremities 5/5  Lymphadenopathy:    He has no cervical adenopathy.  Neurological: He is alert and oriented to person, place, and time.  Distal sensation intact to light touch all extremities  Skin: Skin is warm and dry. No rash noted. He is not diaphoretic. No erythema.  Psychiatric: He has a normal mood and affect. His behavior is normal.  Well groomed, good eye contact, normal speech and thoughts  Nursing note and vitals reviewed.  Results for orders placed or performed in visit on 09/23/17  Hepatitis C antibody  Result Value Ref Range   Hepatitis C Ab NON-REACTIVE NON-REACTI   SIGNAL TO CUT-OFF 0.07 <1.00  CBC with Differential/Platelet  Result Value Ref Range   WBC 6.1 3.8 - 10.8 Thousand/uL   RBC 5.42 4.20 - 5.80 Million/uL   Hemoglobin 14.5 13.2 - 17.1 g/dL   HCT 43.1 38.5 - 50.0 %   MCV 79.5 (L) 80.0 - 100.0 fL   MCH 26.8 (L) 27.0 - 33.0 pg   MCHC 33.6 32.0 - 36.0 g/dL   RDW 13.0 11.0 - 15.0 %   Platelets 110 (L) 140 - 400 Thousand/uL   MPV 11.4 7.5 - 12.5 fL   Neutro Abs 4,044 1,500 - 7,800 cells/uL   Lymphs Abs 1,275 850 - 3,900 cells/uL   WBC mixed population 555 200 - 950 cells/uL    Eosinophils Absolute 183 15 - 500 cells/uL   Basophils Absolute 43 0 - 200 cells/uL   Neutrophils Relative % 66.3 %   Total Lymphocyte 20.9 %   Monocytes Relative 9.1 %   Eosinophils Relative 3.0 %   Basophils Relative  0.7 %   Smear Review    TSH  Result Value Ref Range   TSH 1.57 0.40 - 4.50 mIU/L  Hemoglobin A1c  Result Value Ref Range   Hgb A1c MFr Bld 5.4 <5.7 % of total Hgb   Mean Plasma Glucose 108 (calc)   eAG (mmol/L) 6.0 (calc)  Lipid panel  Result Value Ref Range   Cholesterol 176 <200 mg/dL   HDL 41 >40 mg/dL   Triglycerides 147 <150 mg/dL   LDL Cholesterol (Calc) 109 (H) mg/dL (calc)   Total CHOL/HDL Ratio 4.3 <5.0 (calc)   Non-HDL Cholesterol (Calc) 135 (H) <130 mg/dL (calc)  COMPLETE METABOLIC PANEL WITH GFR  Result Value Ref Range   Glucose, Bld 97 65 - 139 mg/dL   BUN 21 7 - 25 mg/dL   Creat 1.13 0.70 - 1.25 mg/dL   GFR, Est Non African American 66 > OR = 60 mL/min/1.30m2   GFR, Est African American 76 > OR = 60 mL/min/1.32m2   BUN/Creatinine Ratio NOT APPLICABLE 6 - 22 (calc)   Sodium 141 135 - 146 mmol/L   Potassium 4.3 3.5 - 5.3 mmol/L   Chloride 105 98 - 110 mmol/L   CO2 29 20 - 32 mmol/L   Calcium 9.2 8.6 - 10.3 mg/dL   Total Protein 6.9 6.1 - 8.1 g/dL   Albumin 3.9 3.6 - 5.1 g/dL   Globulin 3.0 1.9 - 3.7 g/dL (calc)   AG Ratio 1.3 1.0 - 2.5 (calc)   Total Bilirubin 0.7 0.2 - 1.2 mg/dL   Alkaline phosphatase (APISO) 71 40 - 115 U/L   AST 18 10 - 35 U/L   ALT 13 9 - 46 U/L      Assessment & Plan:   Problem List Items Addressed This Visit    Dyslipidemia (high LDL; low HDL)    Mostly controlled cholesterol Last lipid panel 09/2017 Calculated ASCVD 10 yr risk score >21%  Plan: 1. Counseling on ASCVD risk - offered statin, he has declined 2. Offered - ASA - declined 3. Encourage improved lifestyle - low carb/cholesterol, reduce portion size, continue improving regular exercise 4. Follow-up yearly      Hypertension    Well-controlled  HTN - Home BP readings none No known complications - except CHB, Prolong QT, pacer    Plan:  1. Continue current BP regimen per Cardiology - Losartan 100mg , Amlodipine 2.5mg , Lasix 40mg  - refilled today on request 2. Encourage improved lifestyle - low sodium diet, needs to improve regular exercise 3. Continue monitor BP outside office, bring readings to next visit, if persistently >140/90 or new symptoms notify office sooner 4. Follow-up yearly      Relevant Medications   amLODipine (NORVASC) 2.5 MG tablet   furosemide (LASIX) 40 MG tablet   losartan (COZAAR) 100 MG tablet   Long Q-T syndrome    Stable, without complication, on pacemaker Followed by Cardiology Dr Caryl Comes      Relevant Medications   amLODipine (NORVASC) 2.5 MG tablet   furosemide (LASIX) 40 MG tablet   losartan (COZAAR) 100 MG tablet   Morbid obesity with BMI of 40.0-44.9, adult (HCC)    Chronic morbid obesity still wt gain Concern with poor lifestyle mostly sedentary due to work, limited time - Some poor dietary habits, still improving - Limited wt loss med options, concern prolong QT - Normal TSH  Plan: 1. Encourage lifestyle improvement exercise diet as discussed, goal 1-2 lb per month wt loss continued 2. Future re-offer (previously declined) -  referral to [facility] - Dr Dennard Nip for further evaluation and management      Relevant Medications   magnesium oxide (MAG-OX) 400 MG tablet   Thrombocytopenia (HCC)    Slightly lower plt, chronic problem >3-5 years on labs Reviewed risks and complications No clear etiology Recommend future work-up vs referral to heme if interested, declines at this time       Other Visit Diagnoses    Annual physical exam    -  Primary      Meds ordered this encounter  Medications  . amLODipine (NORVASC) 2.5 MG tablet    Sig: Take one tablet (2.5 mg) by mouth once daily at bedtime    Dispense:  90 tablet    Refill:  3  . furosemide (LASIX) 40  MG tablet    Sig: Take 1 tablet (40 mg total) by mouth daily. Mon-Fri, hold Sat/Sun    Dispense:  90 tablet    Refill:  3  . losartan (COZAAR) 100 MG tablet    Sig: Take one tablet (100 mg) by mouth once daily in the morning    Dispense:  90 tablet    Refill:  3    Follow up plan: Return in about 1 year (around 09/25/2018) for Annual Physical.  Nobie Putnam, DO [facility] 09/25/2017, 5:48 PM

## 2017-09-25 NOTE — Assessment & Plan Note (Signed)
Slightly lower plt, chronic problem >3-5 years on labs Reviewed risks and complications No clear etiology Recommend future work-up vs referral to heme if interested, declines at this time

## 2017-09-25 NOTE — Assessment & Plan Note (Signed)
Well-controlled HTN - Home BP readings none No known complications - except CHB, Prolong QT, pacer    Plan:  1. Continue current BP regimen per Cardiology - Losartan 100mg , Amlodipine 2.5mg , Lasix 40mg  - refilled today on request 2. Encourage improved lifestyle - low sodium diet, needs to improve regular exercise 3. Continue monitor BP outside office, bring readings to next visit, if persistently >140/90 or new symptoms notify office sooner 4. Follow-up yearly

## 2017-09-25 NOTE — Assessment & Plan Note (Signed)
Mostly controlled cholesterol Last lipid panel 09/2017 Calculated ASCVD 10 yr risk score >21%  Plan: 1. Counseling on ASCVD risk - offered statin, he has declined 2. Offered - ASA - declined 3. Encourage improved lifestyle - low carb/cholesterol, reduce portion size, continue improving regular exercise 4. Follow-up yearly

## 2017-09-25 NOTE — Patient Instructions (Addendum)
Thank you for coming to the clinic today.  1. Keep up the good work overall.  2. Here are your lab results, overall blood sugar and cholesterol results are very good.  We discussed Statin Cholesterol medicine, as most people with your risk factors you would benefit from reducing or preventing risk in the future, we can re-consider this  3. Your platelets are still low, this is chronic problem for >5 years, as long as not having major bleeding problem or other concerns we can hold off on referral for now.  DUE for FASTING BLOOD WORK (no food or drink after midnight before the lab appointment, only water or coffee without cream/sugar on the morning of)  SCHEDULE "Lab Only" visit in the morning at the clinic for lab draw in 1 YEAR  - Make sure Lab Only appointment is at about 1 week before your next appointment, so that results will be available  For Lab Results, once available within 2-3 days of blood draw, you can can log in to MyChart online to view your results and a brief explanation. Also, we can discuss results at next follow-up visit.   Please schedule a Follow-up Appointment to: Return in about 1 year (around 09/25/2018) for Annual Physical.  If you have any other questions or concerns, please feel free to call the clinic or send a message through [place]. You may also schedule an earlier appointment if necessary.  Additionally, you may be receiving a survey about your experience at our clinic within a few days to 1 week by e-mail or mail. We value your feedback.  Nobie Putnam, DO [place]

## 2017-09-25 NOTE — Assessment & Plan Note (Signed)
Chronic morbid obesity still wt gain Concern with poor lifestyle mostly sedentary due to work, limited time - Some poor dietary habits, still improving - Limited wt loss med options, concern prolong QT - Normal TSH  Plan: 1. Encourage lifestyle improvement exercise diet as discussed, goal 1-2 lb per month wt loss continued 2. Future re-offer (previously declined) - referral to [facility] - Dr Dennard Nip for further evaluation and management

## 2017-09-29 ENCOUNTER — Encounter: Payer: Medicare Other | Admitting: *Deleted

## 2017-10-03 ENCOUNTER — Encounter: Payer: Self-pay | Admitting: Cardiology

## 2018-03-10 ENCOUNTER — Encounter: Payer: Self-pay | Admitting: Pulmonary Disease

## 2018-03-10 ENCOUNTER — Ambulatory Visit (INDEPENDENT_AMBULATORY_CARE_PROVIDER_SITE_OTHER): Payer: Medicare Other | Admitting: Pulmonary Disease

## 2018-03-10 VITALS — BP 130/78 | HR 83 | Resp 16 | Ht 72.0 in | Wt 305.0 lb

## 2018-03-10 DIAGNOSIS — G4733 Obstructive sleep apnea (adult) (pediatric): Secondary | ICD-10-CM

## 2018-03-10 NOTE — Patient Instructions (Signed)
We discussed the importance of weight loss and strategies Continue CPAP Follow-up in 1 year or sooner as needed

## 2018-03-10 NOTE — Progress Notes (Signed)
This is a routine follow-up for OSA which remains well controlled.  He remains compliant with CPAP.  He has not made any concerted effort at weight loss.   CPAP compliance report reviewed: 30/30 days. 28/30 days > 4 hrs  Vitals:   03/10/18 1157 03/10/18 1158  BP:  130/78  Pulse:  83  Resp: 16   SpO2:  95%  Weight: (!) 305 lb (138.3 kg)   Height: 6' (1.829 m)    NAD HEENT WNL JVP not visualized Chest clear RRR, no M Centripetal obesity, NABS Extremities warm, no edema No focal neurologic findings  IMPRESSION: OSA - well controlled Severe obesity  PLAN: Continue CPAP at current settings We discussed the importance of weight loss and possible strategies Follow-up in 1 year or sooner as needed  Merton Border, MD PCCM service Mobile [phone] Pager [phone]

## 2018-06-30 ENCOUNTER — Telehealth: Payer: Self-pay | Admitting: Family Medicine

## 2018-06-30 NOTE — Telephone Encounter (Signed)
resched awv  °

## 2018-08-07 DIAGNOSIS — M5384 Other specified dorsopathies, thoracic region: Secondary | ICD-10-CM | POA: Diagnosis not present

## 2018-08-07 DIAGNOSIS — M5386 Other specified dorsopathies, lumbar region: Secondary | ICD-10-CM | POA: Diagnosis not present

## 2018-08-07 DIAGNOSIS — M9903 Segmental and somatic dysfunction of lumbar region: Secondary | ICD-10-CM | POA: Diagnosis not present

## 2018-08-07 DIAGNOSIS — M9902 Segmental and somatic dysfunction of thoracic region: Secondary | ICD-10-CM | POA: Diagnosis not present

## 2018-09-22 ENCOUNTER — Other Ambulatory Visit: Payer: Self-pay

## 2018-09-22 ENCOUNTER — Telehealth: Payer: Self-pay

## 2018-09-22 ENCOUNTER — Ambulatory Visit: Payer: Medicare Other

## 2018-09-22 DIAGNOSIS — I10 Essential (primary) hypertension: Secondary | ICD-10-CM

## 2018-09-22 MED ORDER — AMLODIPINE BESYLATE 2.5 MG PO TABS: ORAL_TABLET | ORAL | 0 refills | Status: DC

## 2018-09-22 MED ORDER — FUROSEMIDE 40 MG PO TABS: 40.0000 mg | ORAL_TABLET | Freq: Every day | ORAL | 0 refills | Status: DC

## 2018-09-22 MED ORDER — LOSARTAN POTASSIUM 100 MG PO TABS: ORAL_TABLET | ORAL | 0 refills | Status: DC

## 2018-09-22 NOTE — Telephone Encounter (Signed)
*  STAT* If patient is at the pharmacy, call can be transferred to refill team.   1. Which medications need to be refilled? (please list name of each medication and dose if known)  Amlodipine  ;psartan  Furosemide  2. Which pharmacy/location (including street and city if local pharmacy) is medication to be sent to? [facility] in [place]  3. Do they need a 30 day or 90 day supply? 30 day

## 2018-09-22 NOTE — Telephone Encounter (Signed)
Requested Prescriptions   Signed Prescriptions Disp Refills  . amLODipine (NORVASC) 2.5 MG tablet 30 tablet 0    Sig: Take one tablet (2.5 mg) by mouth once daily at bedtime    Authorizing Provider: Deboraha Sprang    Ordering User: Janan Ridge furosemide (LASIX) 40 MG tablet 30 tablet 0    Sig: Take 1 tablet (40 mg total) by mouth daily. Mon-Fri, hold Sat/Sun    Authorizing Provider: Deboraha Sprang    Ordering User: Janan Ridge losartan (COZAAR) 100 MG tablet 30 tablet 0    Sig: Take one tablet (100 mg) by mouth once daily in the morning    Authorizing Provider: Deboraha Sprang    Ordering User: Janan Ridge

## 2018-10-06 ENCOUNTER — Ambulatory Visit: Payer: Medicare Other

## 2018-10-27 ENCOUNTER — Telehealth: Payer: Self-pay | Admitting: Internal Medicine

## 2018-10-27 ENCOUNTER — Other Ambulatory Visit: Payer: Self-pay

## 2018-10-27 DIAGNOSIS — I10 Essential (primary) hypertension: Secondary | ICD-10-CM

## 2018-10-27 MED ORDER — AMLODIPINE BESYLATE 2.5 MG PO TABS: ORAL_TABLET | ORAL | 0 refills | Status: DC

## 2018-10-27 NOTE — Telephone Encounter (Signed)
amLODipine (NORVASC) 2.5 MG tablet 90 tablet 0 10/27/2018    Sig: Take one tablet (2.5 mg) by mouth once daily at bedtime   Sent to pharmacy as: amLODipine (NORVASC) 2.5 MG tablet   E-Prescribing Status: Sent to pharmacy (10/27/2018 3:17 PM EST)

## 2018-10-27 NOTE — Telephone Encounter (Signed)
°*  STAT* If patient is at the pharmacy, call can be transferred to refill team.   1. Which medications need to be refilled? (please list name of each medication and dose if known) Amlodipine 2.5 mg po QHS  2. Which pharmacy/location (including street and city if local pharmacy) is medication to be sent to? [facility] [place]   3. Do they need a 30 day or 90 day supply? [place]

## 2018-11-17 ENCOUNTER — Encounter: Payer: Self-pay | Admitting: Internal Medicine

## 2018-11-17 ENCOUNTER — Ambulatory Visit (INDEPENDENT_AMBULATORY_CARE_PROVIDER_SITE_OTHER): Payer: Medicare Other | Admitting: Internal Medicine

## 2018-11-17 VITALS — BP 110/60 | HR 69 | Ht 72.0 in | Wt 303.5 lb

## 2018-11-17 DIAGNOSIS — I480 Paroxysmal atrial fibrillation: Secondary | ICD-10-CM

## 2018-11-17 DIAGNOSIS — I442 Atrioventricular block, complete: Secondary | ICD-10-CM | POA: Diagnosis not present

## 2018-11-17 DIAGNOSIS — Z79899 Other long term (current) drug therapy: Secondary | ICD-10-CM

## 2018-11-17 DIAGNOSIS — I10 Essential (primary) hypertension: Secondary | ICD-10-CM | POA: Diagnosis not present

## 2018-11-17 DIAGNOSIS — Z950 Presence of cardiac pacemaker: Secondary | ICD-10-CM

## 2018-11-17 MED ORDER — AMLODIPINE BESYLATE 2.5 MG PO TABS
ORAL_TABLET | ORAL | 0 refills | Status: DC
Start: 2018-11-17 — End: 2018-11-17

## 2018-11-17 MED ORDER — FUROSEMIDE 40 MG PO TABS: 40.0000 mg | ORAL_TABLET | Freq: Every day | ORAL | 3 refills | Status: DC

## 2018-11-17 MED ORDER — LOSARTAN POTASSIUM 100 MG PO TABS: ORAL_TABLET | ORAL | 3 refills | Status: DC

## 2018-11-17 MED ORDER — AMLODIPINE BESYLATE 5 MG PO TABS: 5.0000 mg | ORAL_TABLET | Freq: Every day | ORAL | 3 refills | Status: DC

## 2018-11-17 NOTE — Patient Instructions (Signed)
Medication Instructions:  - Your physician has recommended you make the following change in your medication:   1) INCREASE norvasc (amlodipine) to 5 mg- take 1 tablet by mouth once daily  2) STOP cozaar (losartan)   If you need a refill on your cardiac medications before your next appointment, please call your pharmacy.   Lab work: - Your physician recommends that you return for lab work in: 2 weeks- BMP  If you have labs (blood work) drawn today and your tests are completely normal, you will receive your results only by: [place] MyChart Message (if you have MyChart) OR . A paper copy in the mail If you have any lab test that is abnormal or we need to change your treatment, we will call you to review the results.  Testing/Procedures: - none ordered  Follow-Up: At [facility], you and your health needs are our priority.  As part of our continuing mission to provide you with exceptional heart care, we have created designated Provider Care Teams.  These Care Teams include your primary Cardiologist (physician) and Advanced Practice Providers (APPs -  Physician Assistants and Nurse Practitioners) who all work together to provide you with the care you need, when you need it. . You will need a follow up appointment in 1 year with Dr. Caryl Comes.  Please call our office 2 months in advance (November 2020) to schedule this appointment.    Remote monitoring is used to monitor your Pacemaker of ICD from home. This monitoring reduces the number of office visits required to check your device to one time per year. It allows [place] to keep an eye on the functioning of your device to ensure it is working properly. You are scheduled for a device check from home on 02/16/2019. You may send your transmission at any time that day. If you have a wireless device, the transmission will be sent automatically. After your physician reviews your transmission, you will receive a postcard with your next transmission date.   Any  Other Special Instructions Will Be Listed Below (If Applicable). - N/A

## 2018-11-17 NOTE — Progress Notes (Signed)
Patient Care Team: [name], DO as PCP - General (Family Medicine)   HPI  William Woods. is a 73 y.o. male Seen for chief complaint for pacemaker implanted for symptomatic intermittent complete heart block 2013.  History of syncope with a prolonged QT interval and he had undergone loop recorder insertion. With recurrent syncope heart block was identified.   Treated with beta blockers for hypertension and long QT,  stopped this and his now on losartan   Diagnosed with Sleep apnea and has been improved with CPAP   BP has been much better since the control of his sleep apnea      Date Cr K Hgb  12/18 1.13 4.3 14.5           He has  told me of the death of his two sons, one from cancer and one from cocaine overdose  And Gods calling him back via God so Loved that he Gave  The patient denies chest pain, shortness of breath, nocturnal dyspnea, orthopnea or peripheral edema.  There have been no palpitations, lightheadedness or syncope.    Past Medical History:  Diagnosis Date  . CHB (complete heart block) ([place])   . Edema    Suspect diastolic heart failure  . Hypertension   . Long Q-T syndrome   . Obesity (BMI 30-39.9)   . Obstructive sleep apnea   . Pacemaker    Medtronic Adapta L model ADDRL 1 (serial number [id number]) pacemaker.  . Syncope   . Thyroid disease     Past Surgical History:  Procedure Laterality Date  . CARDIAC CATHETERIZATION     [facility]   . CARDIAC CATHETERIZATION  01/2012   [facility]: no significant CAD, EF 40%.   Marland Kitchen LOOP RECORDER EXPLANT N/A 02/04/2012   Procedure: LOOP RECORDER EXPLANT;  Surgeon: Thompson Grayer, MD;  Location: [facility] CATH LAB;  Service: Cardiovascular;  Laterality: N/A;  . LOOP RECORDER IMPLANT N/A 01/21/2012   Procedure: LOOP RECORDER IMPLANT;  Surgeon: Deboraha Sprang, MD;  Location: [facility] CATH LAB;  Service: Cardiovascular;  Laterality: N/A;  . OTHER SURGICAL HISTORY  02/04/2012    removal of a previously implanted MDT  Reveal XT implantable loop recorder  . PACEMAKER INSERTION  02/04/2012   Medtronic Adapta L model ADDRL 1 (serial number [id number]) pacemaker.  Marland Kitchen PERMANENT PACEMAKER INSERTION N/A 02/04/2012   Procedure: PERMANENT PACEMAKER INSERTION;  Surgeon: Thompson Grayer, MD;  Location: [facility] CATH LAB;  Service: Cardiovascular;  Laterality: N/A;    Current Outpatient Medications  Medication Sig Dispense Refill  . amLODipine (NORVASC) 2.5 MG tablet Take one tablet (2.5 mg) by mouth once daily at bedtime 90 tablet 0  . Ascorbic Acid (VITAMIN C) 1000 MG tablet Take 1,000 mg by mouth 2 (two) times daily.    . furosemide (LASIX) 40 MG tablet Take 1 tablet (40 mg total) by mouth daily. Mon-Fri, hold Sat/Sun 90 tablet 3  . losartan (COZAAR) 100 MG tablet Take one tablet (100 mg) by mouth once daily in the morning 90 tablet 3  . magnesium oxide (MAG-OX) 400 MG tablet Take 400 mg by mouth daily.     No current facility-administered medications for this visit.     Allergies  Allergen Reactions  . Other     LONG QT SYNDROME--MANY DRUGS PATIENT HAS TO AVOID SEE WEBSITE FOR DETAILS Http://www.sads.org.uk/drugs_to_avoid.htm     Review of Systems negative except from HPI and PMH  Physical Exam BP 110/60 (BP Location:  Left Arm, Patient Position: Sitting, Cuff Size: Large)   Pulse 69   Ht 6' (1.829 m)   Wt (!) 303 lb 8 oz (137.7 kg)   BMI 41.16 kg/m  Well developed and Morbidly obese in no acute distress HENT normal Neck supple with JVP-flat Clear Device pocket well healed; without hematoma or erythema.  There is no tethering  Regular rate and rhythm, no  gallop No  murmur Abd-soft with active BS No Clubbing cyanosis  edema Skin-warm and dry A & Oriented  Grossly normal sensory and motor function    ECG atrial fibrillation with ventricular pacing  Assessment and  Plan  Complete Heart block    Pacemaker- Medtronic  The patient's device was interrogated.  The information was reviewed. No changes  were made in the programming.       OSA  Much improved  Continue therapy  HFpEF--volume overload chronic   HTN  Atrial fibrillation  No intercurrent atrial fibrillation or flutter  On Anticoagulation;  No bleeding issues   BP weill controlled   .

## 2018-11-18 LAB — CUP PACEART INCLINIC DEVICE CHECK
Battery Impedance: 605 Ohm
Battery Remaining Longevity: 80 mo
Battery Voltage: 2.77 V
Brady Statistic AP VP Percent: 26 %
Brady Statistic AP VS Percent: 0 %
Brady Statistic AS VP Percent: 73 %
Brady Statistic AS VS Percent: 1 %
Date Time Interrogation Session: 20200128171332
Implantable Lead Implant Date: 20130416
Implantable Lead Implant Date: 20130416
Implantable Lead Location: 753859
Implantable Lead Location: 753860
Implantable Lead Model: 5076
Implantable Lead Model: 5076
Implantable Pulse Generator Implant Date: 20130416
Lead Channel Impedance Value: 377 Ohm
Lead Channel Impedance Value: 419 Ohm
Lead Channel Pacing Threshold Amplitude: 0.75 V
Lead Channel Pacing Threshold Pulse Width: 0.4 ms
Lead Channel Sensing Intrinsic Amplitude: 1 mV
Lead Channel Setting Pacing Amplitude: 1.75 V
Lead Channel Setting Pacing Amplitude: 2.5 V
Lead Channel Setting Pacing Pulse Width: 0.4 ms
Lead Channel Setting Sensing Sensitivity: 4 mV

## 2018-12-01 ENCOUNTER — Other Ambulatory Visit (INDEPENDENT_AMBULATORY_CARE_PROVIDER_SITE_OTHER): Payer: Medicare Other

## 2018-12-01 DIAGNOSIS — I10 Essential (primary) hypertension: Secondary | ICD-10-CM

## 2018-12-01 DIAGNOSIS — I480 Paroxysmal atrial fibrillation: Secondary | ICD-10-CM

## 2018-12-02 LAB — BASIC METABOLIC PANEL
BUN/Creatinine Ratio: 15 (ref 10–24)
BUN: 17 mg/dL (ref 8–27)
CO2: 23 mmol/L (ref 20–29)
Calcium: 9.6 mg/dL (ref 8.6–10.2)
Chloride: 101 mmol/L (ref 96–106)
Creatinine, Ser: 1.14 mg/dL (ref 0.76–1.27)
GFR calc Af Amer: 75 mL/min/{1.73_m2} (ref >59)
GFR calc non Af Amer: 65 mL/min/{1.73_m2} (ref >59)
Glucose: 103 mg/dL — ABNORMAL HIGH (ref 65–99)
Potassium: 4.3 mmol/L (ref 3.5–5.2)
Sodium: 143 mmol/L (ref 134–144)

## 2019-02-16 ENCOUNTER — Ambulatory Visit (INDEPENDENT_AMBULATORY_CARE_PROVIDER_SITE_OTHER): Payer: Medicare Other | Admitting: *Deleted

## 2019-02-16 ENCOUNTER — Other Ambulatory Visit: Payer: Self-pay

## 2019-02-16 DIAGNOSIS — I442 Atrioventricular block, complete: Secondary | ICD-10-CM | POA: Diagnosis not present

## 2019-02-16 DIAGNOSIS — I480 Paroxysmal atrial fibrillation: Secondary | ICD-10-CM

## 2019-02-17 ENCOUNTER — Telehealth: Payer: Self-pay

## 2019-02-17 NOTE — Telephone Encounter (Signed)
Spoke with patient to remind of missed remote transmission 

## 2019-02-21 LAB — CUP PACEART REMOTE DEVICE CHECK
Battery Impedance: 707 Ohm
Battery Remaining Longevity: 64 mo
Battery Voltage: 2.77 V
Brady Statistic AP VP Percent: 21 %
Brady Statistic AP VS Percent: 0 %
Brady Statistic AS VP Percent: 79 %
Brady Statistic AS VS Percent: 1 %
Date Time Interrogation Session: 20200502113542
Implantable Lead Implant Date: 20130416
Implantable Lead Implant Date: 20130416
Implantable Lead Location: 753859
Implantable Lead Location: 753860
Implantable Lead Model: 5076
Implantable Lead Model: 5076
Implantable Pulse Generator Implant Date: 20130416
Lead Channel Impedance Value: 363 Ohm
Lead Channel Impedance Value: 431 Ohm
Lead Channel Pacing Threshold Amplitude: 0.5 V
Lead Channel Pacing Threshold Amplitude: 0.875 V
Lead Channel Pacing Threshold Pulse Width: 0.4 ms
Lead Channel Pacing Threshold Pulse Width: 0.4 ms
Lead Channel Setting Pacing Amplitude: 1.75 V
Lead Channel Setting Pacing Amplitude: 2.5 V
Lead Channel Setting Pacing Pulse Width: 0.46 ms
Lead Channel Setting Sensing Sensitivity: 4 mV

## 2019-02-22 ENCOUNTER — Telehealth: Payer: Self-pay | Admitting: Internal Medicine

## 2019-02-22 NOTE — Telephone Encounter (Signed)
I spoke with the patient regarding findings from his device transmissions and Dr. Olin Pia recommendations.  The patient states he has had more fatigue over the last 3-4 weeks.  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  I advised the patient and his wife that the stress of the current situation with his work may be contributing to some of the a-fib episodes. However, he has had this in the past, so he is aware there may be no real cause for his a-fib. Aside from fatigue, he denies any other symptoms and cannot tell when his heart is out of rhythm.   I have advised him I just wanted to make him aware of the findings, but will forward this information back to Dr. Caryl Comes as an Juluis Rainier and call him back with any further recommendations.  He does also confirm he is on amlodipine 5 mg once daily and his BP's are running 125/ 70-80's & HR's are usually 60-70.   The patient voices understanding of the above and is agreeable.

## 2019-02-22 NOTE — Telephone Encounter (Signed)
Notes recorded by Deboraha Sprang, MD on 02/22/2019 at 7:47 AM EDT Remote reviewed. This remote is abnormal for more atrial fibrillation   H if you could please let him know, that we are seeing more, but the episodes are all brief < 6 hrs so for now we will continue to hold off on anticoagulation  thx  SK

## 2019-02-23 NOTE — Telephone Encounter (Signed)
Thanks H I tried to call him last night but he didn't receive my "blocked" number  A sad thing     Thanks for leting him know

## 2019-02-25 NOTE — Progress Notes (Signed)
Remote pacemaker transmission.   

## 2019-04-21 ENCOUNTER — Telehealth: Payer: Self-pay | Admitting: Pulmonary Disease

## 2019-04-21 NOTE — Telephone Encounter (Signed)
Called patient for COVID-19 pre-screening for in office visit. ° °Have you recently traveled any where out of the local area in the last 2 weeks? No ° °Have you been in close contact with a person diagnosed with COVID-19 or someone awaiting results within the last 2 weeks? No ° °Do you currently have any of the following symptoms? If so, when did they start? °Cough     Diarrhea   Joint Pain °Fever      Muscle Pain   Red eyes °Shortness of breath   Abdominal pain  Vomiting °Loss of smell    Rash    Sore Throat °Headache    Weakness   Bruising or bleeding ° ° °Okay to proceed with visit 04/22/2019  ° ° °

## 2019-04-22 ENCOUNTER — Other Ambulatory Visit: Payer: Self-pay

## 2019-04-22 ENCOUNTER — Ambulatory Visit
Admission: RE | Admit: 2019-04-22 | Discharge: 2019-04-22 | Disposition: A | Discharge status: Home / Self Care | Payer: Medicare Other | Source: Ambulatory Visit | Attending: Pulmonary Disease | Admitting: Pulmonary Disease

## 2019-04-22 ENCOUNTER — Ambulatory Visit (INDEPENDENT_AMBULATORY_CARE_PROVIDER_SITE_OTHER): Payer: Medicare Other | Admitting: Pulmonary Disease

## 2019-04-22 ENCOUNTER — Encounter: Payer: Self-pay | Admitting: Pulmonary Disease

## 2019-04-22 ENCOUNTER — Other Ambulatory Visit
Admission: RE | Admit: 2019-04-22 | Discharge: 2019-04-22 | Disposition: A | Discharge status: Home / Self Care | Payer: Medicare Other | Source: Home / Self Care | Attending: Pulmonary Disease | Admitting: Pulmonary Disease

## 2019-04-22 ENCOUNTER — Ambulatory Visit
Admission: RE | Admit: 2019-04-22 | Discharge: 2019-04-22 | Disposition: A | Discharge status: Home / Self Care | Payer: Medicare Other | Attending: Pulmonary Disease | Admitting: Pulmonary Disease

## 2019-04-22 VITALS — BP 128/78 | HR 91 | Temp 96.9°F | Ht 72.0 in | Wt 299.4 lb

## 2019-04-22 DIAGNOSIS — R0600 Dyspnea, unspecified: Secondary | ICD-10-CM

## 2019-04-22 DIAGNOSIS — G4733 Obstructive sleep apnea (adult) (pediatric): Secondary | ICD-10-CM | POA: Diagnosis not present

## 2019-04-22 DIAGNOSIS — R0609 Other forms of dyspnea: Secondary | ICD-10-CM | POA: Insufficient documentation

## 2019-04-22 DIAGNOSIS — J9811 Atelectasis: Secondary | ICD-10-CM | POA: Diagnosis not present

## 2019-04-22 DIAGNOSIS — Z87891 Personal history of nicotine dependence: Secondary | ICD-10-CM

## 2019-04-22 DIAGNOSIS — I517 Cardiomegaly: Secondary | ICD-10-CM | POA: Diagnosis not present

## 2019-04-22 LAB — CBC
HCT: 45.7 % (ref 39.0–52.0)
Hemoglobin: 14.1 g/dL (ref 13.0–17.0)
MCH: 26 pg (ref 26.0–34.0)
MCHC: 30.9 g/dL (ref 30.0–36.0)
MCV: 84.2 fL (ref 80.0–100.0)
Platelets: 141 10*3/uL — ABNORMAL LOW (ref 150–400)
RBC: 5.43 MIL/uL (ref 4.22–5.81)
RDW: 14.3 % (ref 11.5–15.5)
WBC: 7.4 10*3/uL (ref 4.0–10.5)
nRBC: 0 % (ref 0.0–0.2)

## 2019-04-22 LAB — BRAIN NATRIURETIC PEPTIDE: B Natriuretic Peptide: 374 pg/mL — ABNORMAL HIGH (ref 0.0–100.0)

## 2019-04-22 NOTE — Patient Instructions (Signed)
For sleep apnea, continue CPAP at current settings  For evaluation of shortness of breath, the following tests have been ordered: 1) blood tests today: CBC, B natruretic peptide 2) chest x-ray today 3) lung function tests (PFTs)  I will contact you after the results of the above tests are available if further testing or evaluation is necessary.  Otherwise, follow-up 4-6 weeks with me to review the above and further evaluate shortness of breath.

## 2019-04-22 NOTE — Addendum Note (Signed)
Addended by: Santiago Bur on: 04/22/2019 12:45 PM   Modules accepted: Orders

## 2019-04-22 NOTE — Progress Notes (Signed)
This is a routine follow-up for OSA which remains well controlled.  And he remains compliant with CPAP, tolerating it well. He reports no significant daytime hypersomnolence.   His biggest concern is the development of exertional dyspnea with onset around 6 months ago. He denies chest pain. There is little day to day or moment to moment variation in his symptoms. He has no cough, sputum, hemoptysis, pleurodynia. He notes that the onset of dyspnea was shortly after a change in his anti-hypertensive medications. This has not been evaluated by any other MD.   CPAP compliance report reviewed: Autoset 5-20 cm H2O. Usage 30/30. Mean max pressure 16 cm H2O. Mean AHI 3.1/hr  Vitals:   04/22/19 1205 04/22/19 1207  BP:  128/78  Pulse:  91  Temp:  (!) 96.9 F (36.1 C)  TempSrc:  Temporal  SpO2:  94%  Weight: 299 lb 6.4 oz (135.8 kg)   Height: 6' (1.829 m)   RA  Very obese, No overt respiratory distress HEENT WNL JVP not visualized Chest clear anteriorly and posteriorly without adventitious sounds RRR, no M noted Centripetal obesity, NABS Extremities warm, 2-3+ symmetric pitting LE edema No focal neurologic findings  IMPRESSION: OSA - well controlled Severe obesity - he has little interest in pursuing weight los New (since last visit) DOE - DDx includes COPD (former heavy smoker) vs CHF (cardiac history)  PLAN: Continue AutoSet CPAP at current settings of 5-20 cm H2O I again encouraged weight loss  For evaluation of shortness of breath, the following tests have been ordered: 1) blood tests today: CBC, B natruretic peptide 2) chest x-ray today 3) lung function tests (PFTs)  I will contact him after the results of the above tests are available if further testing or evaluation is warranted  Otherwise, follow-up 4-6 weeks with me to review the above and to further evaluate shortness of breath    Merton Border, MD PCCM service Mobile [phone] Pager [phone] 04/22/2019 12:31  PM

## 2019-05-03 ENCOUNTER — Other Ambulatory Visit: Payer: Self-pay | Admitting: *Deleted

## 2019-05-03 DIAGNOSIS — R0602 Shortness of breath: Secondary | ICD-10-CM

## 2019-05-18 ENCOUNTER — Ambulatory Visit (INDEPENDENT_AMBULATORY_CARE_PROVIDER_SITE_OTHER): Payer: Medicare Other | Admitting: *Deleted

## 2019-05-18 DIAGNOSIS — I442 Atrioventricular block, complete: Secondary | ICD-10-CM

## 2019-05-18 LAB — CUP PACEART REMOTE DEVICE CHECK
Battery Impedance: 784 Ohm
Battery Remaining Longevity: 63 mo
Battery Voltage: 2.77 V
Brady Statistic AP VP Percent: 19 %
Brady Statistic AP VS Percent: 0 %
Brady Statistic AS VP Percent: 81 %
Brady Statistic AS VS Percent: 0 %
Date Time Interrogation Session: 20200728113512
Implantable Lead Implant Date: 20130416
Implantable Lead Implant Date: 20130416
Implantable Lead Location: 753859
Implantable Lead Location: 753860
Implantable Lead Model: 5076
Implantable Lead Model: 5076
Implantable Pulse Generator Implant Date: 20130416
Lead Channel Impedance Value: 377 Ohm
Lead Channel Impedance Value: 440 Ohm
Lead Channel Pacing Threshold Amplitude: 0.75 V
Lead Channel Pacing Threshold Amplitude: 0.875 V
Lead Channel Pacing Threshold Pulse Width: 0.4 ms
Lead Channel Pacing Threshold Pulse Width: 0.4 ms
Lead Channel Setting Pacing Amplitude: 1.75 V
Lead Channel Setting Pacing Amplitude: 2.5 V
Lead Channel Setting Pacing Pulse Width: 0.4 ms
Lead Channel Setting Sensing Sensitivity: 4 mV

## 2019-05-19 ENCOUNTER — Other Ambulatory Visit: Payer: Self-pay

## 2019-05-19 ENCOUNTER — Telehealth: Payer: Self-pay

## 2019-05-19 NOTE — Telephone Encounter (Signed)
   QUESTIONS ANSWERED BY WIFE MARY COVID-19 Pre-Screening Questions:  . In the past 7 to 10 days have you had a cough, shortness of breath, headache, congestion, fever (100 or greater), body aches, chills, sore throat, or sudden loss of taste or sense of smell? NO . Have you been around anyone with known Covid 19? NO . Have you been around anyone who is awaiting Covid 19 test results in the past 7 to 10 days? NO . Have you been around anyone who has been exposed to Covid 19, or has mentioned symptoms of Covid 19 within the past 7 to 10 days? NO  If you have any concerns/questions about symptoms patients report during screening (either on the phone or at [facility]). Contact the provider seeing the patient or DOD for further guidance.  If neither are available contact a member of the leadership team.

## 2019-05-20 ENCOUNTER — Other Ambulatory Visit: Payer: Self-pay

## 2019-05-20 ENCOUNTER — Ambulatory Visit (INDEPENDENT_AMBULATORY_CARE_PROVIDER_SITE_OTHER): Payer: Medicare Other

## 2019-05-20 DIAGNOSIS — R0602 Shortness of breath: Secondary | ICD-10-CM | POA: Diagnosis not present

## 2019-05-20 MED ORDER — PERFLUTREN LIPID MICROSPHERE
1.0000 mL | INTRAVENOUS | Status: AC | PRN
  Administered 2019-05-20: 2 mL via INTRAVENOUS

## 2019-05-20 NOTE — Progress Notes (Unsigned)
   Patient ID: William Woods., male    DOB: June 20, 1946, 73 y.o.   MRN: [id number]  Given the preliminary finding of reduced LVEF and possible regional wall motion abnormalities on the patient's echocardioogram, Dr Fletcher Anon was consulted before the patient was released. Dr Olin Pia was Secure Chat messaged immediately following the exam. The patient denies symptoms at the time of the echo but did c/o significant intermittent dyspnea over the past couple of months.     Review of Systems    Physical Exam

## 2019-05-21 ENCOUNTER — Other Ambulatory Visit: Payer: Self-pay

## 2019-05-26 ENCOUNTER — Telehealth: Payer: Self-pay | Admitting: Internal Medicine

## 2019-05-26 NOTE — Telephone Encounter (Signed)
Prelim echo shows the patient's EF is down to 20-25%. He is currently scheduled to see Dr. Caryl Comes on 06/15/19.   I left a message on his mobile # to please call back to see if I can move his appointment up to 8/18 with Dr. Caryl Comes.

## 2019-05-28 NOTE — Telephone Encounter (Signed)
Scheduled

## 2019-05-31 ENCOUNTER — Encounter: Payer: Self-pay | Admitting: Cardiology

## 2019-05-31 NOTE — Progress Notes (Signed)
Remote pacemaker transmission.   

## 2019-06-08 ENCOUNTER — Encounter: Payer: Medicare Other | Admitting: Internal Medicine

## 2019-06-08 ENCOUNTER — Encounter: Payer: Self-pay | Admitting: Internal Medicine

## 2019-06-08 ENCOUNTER — Ambulatory Visit (INDEPENDENT_AMBULATORY_CARE_PROVIDER_SITE_OTHER): Payer: Medicare Other | Admitting: Internal Medicine

## 2019-06-08 ENCOUNTER — Other Ambulatory Visit: Payer: Self-pay

## 2019-06-08 ENCOUNTER — Encounter: Payer: Self-pay | Admitting: *Deleted

## 2019-06-08 VITALS — BP 138/80 | HR 72 | Ht 72.0 in | Wt 295.0 lb

## 2019-06-08 DIAGNOSIS — Z950 Presence of cardiac pacemaker: Secondary | ICD-10-CM

## 2019-06-08 DIAGNOSIS — I10 Essential (primary) hypertension: Secondary | ICD-10-CM

## 2019-06-08 DIAGNOSIS — I442 Atrioventricular block, complete: Secondary | ICD-10-CM | POA: Diagnosis not present

## 2019-06-08 DIAGNOSIS — R0602 Shortness of breath: Secondary | ICD-10-CM | POA: Diagnosis not present

## 2019-06-08 DIAGNOSIS — Z01812 Encounter for preprocedural laboratory examination: Secondary | ICD-10-CM | POA: Diagnosis not present

## 2019-06-08 LAB — CUP PACEART INCLINIC DEVICE CHECK
Battery Impedance: 784 Ohm
Battery Remaining Longevity: 63 mo
Battery Voltage: 2.77 V
Brady Statistic AP VP Percent: 18 %
Brady Statistic AP VS Percent: 0 %
Brady Statistic AS VP Percent: 82 %
Brady Statistic AS VS Percent: 0 %
Date Time Interrogation Session: 20200818105203
Implantable Lead Implant Date: 20130416
Implantable Lead Implant Date: 20130416
Implantable Lead Location: 753859
Implantable Lead Location: 753860
Implantable Lead Model: 5076
Implantable Lead Model: 5076
Implantable Pulse Generator Implant Date: 20130416
Lead Channel Impedance Value: 383 Ohm
Lead Channel Impedance Value: 437 Ohm
Lead Channel Pacing Threshold Amplitude: 0.75 V
Lead Channel Pacing Threshold Pulse Width: 0.4 ms
Lead Channel Sensing Intrinsic Amplitude: 1 mV
Lead Channel Setting Pacing Amplitude: 1.75 V
Lead Channel Setting Pacing Amplitude: 2.5 V
Lead Channel Setting Pacing Pulse Width: 0.4 ms
Lead Channel Setting Sensing Sensitivity: 4 mV

## 2019-06-08 MED ORDER — METOPROLOL SUCCINATE ER 25 MG PO TB24: 25.0000 mg | ORAL_TABLET | Freq: Every day | ORAL | 3 refills | Status: DC

## 2019-06-08 MED ORDER — APIXABAN 5 MG PO TABS: 5.0000 mg | ORAL_TABLET | Freq: Two times a day (BID) | ORAL | 6 refills | Status: DC

## 2019-06-08 MED ORDER — POTASSIUM CHLORIDE ER 10 MEQ PO TBCR: 10.0000 meq | EXTENDED_RELEASE_TABLET | Freq: Every day | ORAL | 3 refills | Status: DC

## 2019-06-08 MED ORDER — FUROSEMIDE 40 MG PO TABS: 40.0000 mg | ORAL_TABLET | Freq: Every day | ORAL | 3 refills | Status: DC

## 2019-06-08 MED ORDER — LOSARTAN POTASSIUM 25 MG PO TABS: 25.0000 mg | ORAL_TABLET | Freq: Every day | ORAL | 3 refills | Status: DC

## 2019-06-08 NOTE — H&P (View-Only) (Signed)
Patient Care Team: [name], DO as PCP - General (Family Medicine)   HPI  William Twaddle. is a 73 y.o. male Seen for chief complaint for pacemaker implanted for symptomatic intermittent complete heart block 2013.  History of syncope with a prolonged QT interval and he had undergone loop recorder insertion. With recurrent syncope heart block was identified.   Treated with beta blockers for hypertension and long QT,  stopped this and his now on losartan   Diagnosed with Sleep apnea and has been improved with CPAP   BP has been much better since the control of his sleep apnea     Saw Pulm 7/20 with complaints of new onset (4-77m) SOB  Eval > PFTs and echo  As Below  His wife notes that he is dyspneic walking around the house.  He has had progressive peripheral edema.  He is copious in his sodium and fluid intake.   DATE TEST EF   2006 LHC 60-65 %   7/20 Echo   20-25 %            Date Cr K Hgb  12/18 1.13 4.3 14.5   2/20 1.14 4.3 14.1     He has  told me of the death of his two sons, one from cancer and one from cocaine overdose  And Gods calling him back via [employer] so [employer] that he Gave  The patient denies chest pain, shortness of breath, nocturnal dyspnea, orthopnea or peripheral edema.  There have been no palpitations, lightheadedness or syncope.    Past Medical History:  Diagnosis Date  . CHB (complete heart block) ([place])   . Edema    Suspect diastolic heart failure  . Hypertension   . Long Q-T syndrome   . Obesity (BMI 30-39.9)   . Obstructive sleep apnea   . Pacemaker    Medtronic Adapta L model ADDRL 1 (serial number [id number]) pacemaker.  . Syncope   . Thyroid disease     Past Surgical History:  Procedure Laterality Date  . CARDIAC CATHETERIZATION     [facility]   . CARDIAC CATHETERIZATION  01/2012   [facility]: no significant CAD, EF 40%.   Marland Kitchen LOOP RECORDER EXPLANT N/A 02/04/2012   Procedure: LOOP RECORDER EXPLANT;  Surgeon: Thompson Grayer,  MD;  Location: [facility] CATH LAB;  Service: Cardiovascular;  Laterality: N/A;  . LOOP RECORDER IMPLANT N/A 01/21/2012   Procedure: LOOP RECORDER IMPLANT;  Surgeon: Deboraha Sprang, MD;  Location[facility] CATH LAB;  Service: Cardiovascular;  Laterality: N/A;  . OTHER SURGICAL HISTORY  02/04/2012    removal of a previously implanted MDT Reveal XT implantable loop recorder  . PACEMAKER INSERTION  02/04/2012   Medtronic Adapta L model ADDRL 1 (serial number [id number]) pacemaker.  Marland Kitchen PERMANENT PACEMAKER INSERTION N/A 02/04/2012   Procedure: PERMANENT PACEMAKER INSERTION;  Surgeon: Thompson Grayer, MD;  Location: [facility] CATH LAB;  Service: Cardiovascular;  Laterality: N/A;    Current Outpatient Medications  Medication Sig Dispense Refill  . Ascorbic Acid (VITAMIN C) 1000 MG tablet Take 1,000 mg by mouth 2 (two) times daily.    . furosemide (LASIX) 40 MG tablet Take 1 tablet (40 mg total) by mouth daily. Mon-Fri, hold Sat/Sun 90 tablet 3  . magnesium oxide (MAG-OX) 400 MG tablet Take 400 mg by mouth daily.    Marland Kitchen amLODipine (NORVASC) 5 MG tablet Take 1 tablet (5 mg total) by mouth daily. 90 tablet 3   No current  facility-administered medications for this visit.     Allergies  Allergen Reactions  . Other     LONG QT SYNDROME--MANY DRUGS PATIENT HAS TO AVOID SEE WEBSITE FOR DETAILS Http://www.sads.org.uk/drugs_to_avoid.htm     Review of Systems negative except from HPI and PMH  Physical Exam BP 138/80 (BP Location: Left Arm, Patient Position: Sitting, Cuff Size: Large)   Pulse 72   Ht 6' (1.829 m)   Wt 295 lb (133.8 kg)   SpO2 98%   BMI 40.01 kg/m  Well developed and Morbidly obese in no acute distress HENT normal Neck supple with JVP-10 Clear Device pocket well healed; without hematoma or erythema.  There is no tethering  Regular rate and rhythm, no  gallop No  murmur Abd-soft with active BS No Clubbing cyanosis  2+ edema Skin-warm and dry A & Oriented  Grossly normal sensory and motor  function    ECG  Afib and Vpacing  Assessment and  Plan  Complete Heart block    Cardiomyopathy-new  Congestive heart failure-chronic-/acute--systolic/diastolic  Pacemaker- Medtronic  The patient's device was interrogated.  The information was reviewed. No changes were made in the programming.       OSA  Much improved  Continue therapy   HTN  Atrial fibrillation    The patient has a newly identified cardiomyopathy.  Most recent other evaluation of LV function and I can find his catheterization 2006 demonstrating normal LV function.  It is certainly highly probable that this is related to pacemaker function.  We discussed 2 alternative approaches in this regard.  The first was to proceed with CRT upgrade and the second was 2 start with a catheterization to exclude ischemic disease and then undertake pacing.  He is elected to proceed with the former to see what happens with LV function prior to submitted for catheterization.  This is not unreasonable.  We have discussed the risks and benefits of device upgrade including not limited to infection lead fracture pneumothorax and cardiac perforation.  He understands and is willing to proceed.  For his cardiomyopathy, he also needs guideline directed therapy; we will discontinue his amlodipine.  We will begin him on losartan as well as metoprolol.  Most recent renal function was normal  He also has a significant increase in his atrial fibrillation burden.  This being the case, we will begin him on Eliquis.  Have reviewed the risks and benefits.  We also discussed the potential role of antiarrhythmic therapy for control of his recurrent atrial fibrillation which might be responsible for the variability in his functional status.  It would be my hope that with aggressive diuresis as well as resynchronization that left atrial pressures diminishing would be associated with less atrial fibrillation and we could avoid antiarrhythmic therapy.  He is  significantly volume overloaded.  We will increase his diuretics.  Have also talked about the importance of volume restriction. Marland Kitchen

## 2019-06-08 NOTE — Progress Notes (Signed)
Patient Care Team: [name], DO as PCP - General (Family Medicine)   HPI  William Woods. is a 73 y.o. male Seen for chief complaint for pacemaker implanted for symptomatic intermittent complete heart block 2013.  History of syncope with a prolonged QT interval and he had undergone loop recorder insertion. With recurrent syncope heart block was identified.   Treated with beta blockers for hypertension and long QT,  stopped this and his now on losartan   Diagnosed with Sleep apnea and has been improved with CPAP   BP has been much better since the control of his sleep apnea     Saw Pulm 7/20 with complaints of new onset (4-46m) SOB  Eval > PFTs and echo  As Below  His wife notes that he is dyspneic walking around the house.  He has had progressive peripheral edema.  He is copious in his sodium and fluid intake.   DATE TEST EF   2006 LHC 60-65 %   7/20 Echo   20-25 %            Date Cr K Hgb  12/18 1.13 4.3 14.5   2/20 1.14 4.3 14.1     He has  told me of the death of his two sons, one from cancer and one from cocaine overdose  And Gods calling him back via God so [employer] that he Gave  The patient denies chest pain, shortness of breath, nocturnal dyspnea, orthopnea or peripheral edema.  There have been no palpitations, lightheadedness or syncope.    Past Medical History:  Diagnosis Date  . CHB (complete heart block) ([place])   . Edema    Suspect diastolic heart failure  . Hypertension   . Long Q-T syndrome   . Obesity (BMI 30-39.9)   . Obstructive sleep apnea   . Pacemaker    Medtronic Adapta L model ADDRL 1 (serial number [id number]) pacemaker.  . Syncope   . Thyroid disease     Past Surgical History:  Procedure Laterality Date  . CARDIAC CATHETERIZATION     [facility]   . CARDIAC CATHETERIZATION  01/2012   [facility]: no significant CAD, EF 40%.   Marland Kitchen LOOP RECORDER EXPLANT N/A 02/04/2012   Procedure: LOOP RECORDER EXPLANT;  Surgeon: Thompson Grayer,  MD;  Location: [facility] CATH LAB;  Service: Cardiovascular;  Laterality: N/A;  . LOOP RECORDER IMPLANT N/A 01/21/2012   Procedure: LOOP RECORDER IMPLANT;  Surgeon: Deboraha Sprang, MD;  Location: [facility] CATH LAB;  Service: Cardiovascular;  Laterality: N/A;  . OTHER SURGICAL HISTORY  02/04/2012    removal of a previously implanted MDT Reveal XT implantable loop recorder  . PACEMAKER INSERTION  02/04/2012   Medtronic Adapta L model ADDRL 1 (serial number [id number]) pacemaker.  Marland Kitchen PERMANENT PACEMAKER INSERTION N/A 02/04/2012   Procedure: PERMANENT PACEMAKER INSERTION;  Surgeon: Thompson Grayer, MD;  Location: [facility] CATH LAB;  Service: Cardiovascular;  Laterality: N/A;    Current Outpatient Medications  Medication Sig Dispense Refill  . Ascorbic Acid (VITAMIN C) 1000 MG tablet Take 1,000 mg by mouth 2 (two) times daily.    . furosemide (LASIX) 40 MG tablet Take 1 tablet (40 mg total) by mouth daily. Mon-Fri, hold Sat/Sun 90 tablet 3  . magnesium oxide (MAG-OX) 400 MG tablet Take 400 mg by mouth daily.    Marland Kitchen amLODipine (NORVASC) 5 MG tablet Take 1 tablet (5 mg total) by mouth daily. 90 tablet 3   No current  facility-administered medications for this visit.     Allergies  Allergen Reactions  . Other     LONG QT SYNDROME--MANY DRUGS PATIENT HAS TO AVOID SEE WEBSITE FOR DETAILS Http://www.sads.org.uk/drugs_to_avoid.htm     Review of Systems negative except from HPI and PMH  Physical Exam BP 138/80 (BP Location: Left Arm, Patient Position: Sitting, Cuff Size: Large)   Pulse 72   Ht 6' (1.829 m)   Wt 295 lb (133.8 kg)   SpO2 98%   BMI 40.01 kg/m  Well developed and Morbidly obese in no acute distress HENT normal Neck supple with JVP-10 Clear Device pocket well healed; without hematoma or erythema.  There is no tethering  Regular rate and rhythm, no  gallop No  murmur Abd-soft with active BS No Clubbing cyanosis  2+ edema Skin-warm and dry A & Oriented  Grossly normal sensory and motor  function    ECG  Afib and Vpacing  Assessment and  Plan  Complete Heart block    Cardiomyopathy-new  Congestive heart failure-chronic-/acute--systolic/diastolic  Pacemaker- Medtronic  The patient's device was interrogated.  The information was reviewed. No changes were made in the programming.       OSA  Much improved  Continue therapy   HTN  Atrial fibrillation    The patient has a newly identified cardiomyopathy.  Most recent other evaluation of LV function and I can find his catheterization 2006 demonstrating normal LV function.  It is certainly highly probable that this is related to pacemaker function.  We discussed 2 alternative approaches in this regard.  The first was to proceed with CRT upgrade and the second was 2 start with a catheterization to exclude ischemic disease and then undertake pacing.  He is elected to proceed with the former to see what happens with LV function prior to submitted for catheterization.  This is not unreasonable.  We have discussed the risks and benefits of device upgrade including not limited to infection lead fracture pneumothorax and cardiac perforation.  He understands and is willing to proceed.  For his cardiomyopathy, he also needs guideline directed therapy; we will discontinue his amlodipine.  We will begin him on losartan as well as metoprolol.  Most recent renal function was normal  He also has a significant increase in his atrial fibrillation burden.  This being the case, we will begin him on Eliquis.  Have reviewed the risks and benefits.  We also discussed the potential role of antiarrhythmic therapy for control of his recurrent atrial fibrillation which might be responsible for the variability in his functional status.  It would be my hope that with aggressive diuresis as well as resynchronization that left atrial pressures diminishing would be associated with less atrial fibrillation and we could avoid antiarrhythmic therapy.  He is  significantly volume overloaded.  We will increase his diuretics.  Have also talked about the importance of volume restriction. Marland Kitchen

## 2019-06-08 NOTE — Patient Instructions (Addendum)
Medication Instructions:  - Your physician has recommended you make the following change in your medication:   1) Stop norvasc (amlodipine)  2) Stop over the counter potassium supplements     3) Start cozaar (losartan) 25 mg- take 1 tablet by mouth once daily  4) Start toprol (metoprolol succinate) 25 mg- take 1 tablet by mouth once daily  5) Increase lasix (furosemide) 40 mg- take 1 tablet by mouth TWICE daily x 5 days, then resume 1 tablet by mouth once daily  6) Start potassium 10 meq- take 1 tablet by mouth TWICE daily x 5 days, then take 1 tablet by mouth once daily  7) Start eliquis 5 mg- take 1 tablet by mouth TWICE daily  Samples given: Eliquis 5 mg Lot: [id number] Exp: 9/22 # 2 boxes   Free 30-day trial card given (Eliquis)  If you need a refill on your cardiac medications before your next appointment, please call your pharmacy.   Lab work: - Your physician recommends that you have lab work today: BMP/ CBC  - Pre-procedure COVID swab: Friday 06/11/19 (12:30 pm-2:30 pm) You will need to drive up to the [place] the day of your swab. Do not get out of your car, they will come out and swab you from the car.   If you have labs (blood work) drawn today and your tests are completely normal, you will receive your results only by: Marland Kitchen MyChart Message (if you have MyChart) OR . A paper copy in the mail If you have any lab test that is abnormal or we need to change your treatment, we will call you to review the results.  Testing/Procedures: - Your physician has recommended that you have a Pacemaker upgrade (CRT/ Bi-V device). - see attached instruction sheet  Follow-Up: At [facility], you and your health needs are our priority.  As part of our continuing mission to provide you with exceptional heart care, we have created designated Provider Care Teams.  These Care Teams include your primary Cardiologist (physician) and Advanced Practice Providers (APPs -   Physician Assistants and Nurse Practitioners) who all work together to provide you with the care you need, when you need it. . in 10-14 days (from 06/16/19) with the [facility] for a wound check  . In 91 days (from 06/16/19) with Dr. Caryl Comes  Any Other Special Instructions Will Be Listed Below (If Applicable). - N/A

## 2019-06-09 LAB — CBC WITH DIFFERENTIAL/PLATELET
Basophils Absolute: 0 10*3/uL (ref 0.0–0.2)
Basos: 0 %
EOS (ABSOLUTE): 0.2 10*3/uL (ref 0.0–0.4)
Eos: 3 %
Hematocrit: 45.2 % (ref 37.5–51.0)
Hemoglobin: 14.6 g/dL (ref 13.0–17.7)
Immature Grans (Abs): 0 10*3/uL (ref 0.0–0.1)
Immature Granulocytes: 0 %
Lymphocytes Absolute: 1.4 10*3/uL (ref 0.7–3.1)
Lymphs: 21 %
MCH: 26.1 pg — ABNORMAL LOW (ref 26.6–33.0)
MCHC: 32.3 g/dL (ref 31.5–35.7)
MCV: 81 fL (ref 79–97)
Monocytes Absolute: 0.7 10*3/uL (ref 0.1–0.9)
Monocytes: 10 %
Neutrophils Absolute: 4.4 10*3/uL (ref 1.4–7.0)
Neutrophils: 66 %
Platelets: 158 10*3/uL (ref 150–450)
RBC: 5.6 x10E6/uL (ref 4.14–5.80)
RDW: 13.5 % (ref 11.6–15.4)
WBC: 6.7 10*3/uL (ref 3.4–10.8)

## 2019-06-09 LAB — BASIC METABOLIC PANEL
BUN/Creatinine Ratio: 19 (ref 10–24)
BUN: 21 mg/dL (ref 8–27)
CO2: 24 mmol/L (ref 20–29)
Calcium: 9.1 mg/dL (ref 8.6–10.2)
Chloride: 102 mmol/L (ref 96–106)
Creatinine, Ser: 1.13 mg/dL (ref 0.76–1.27)
GFR calc Af Amer: 75 mL/min/{1.73_m2} (ref >59)
GFR calc non Af Amer: 65 mL/min/{1.73_m2} (ref >59)
Glucose: 94 mg/dL (ref 65–99)
Potassium: 4.2 mmol/L (ref 3.5–5.2)
Sodium: 141 mmol/L (ref 134–144)

## 2019-06-11 ENCOUNTER — Other Ambulatory Visit
Admission: RE | Admit: 2019-06-11 | Discharge: 2019-06-11 | Disposition: A | Discharge status: Home / Self Care | Payer: Medicare Other | Source: Ambulatory Visit | Attending: Internal Medicine | Admitting: Internal Medicine

## 2019-06-11 ENCOUNTER — Other Ambulatory Visit: Payer: Self-pay

## 2019-06-11 ENCOUNTER — Telehealth: Payer: Self-pay | Admitting: Internal Medicine

## 2019-06-11 DIAGNOSIS — Z01812 Encounter for preprocedural laboratory examination: Secondary | ICD-10-CM | POA: Diagnosis not present

## 2019-06-11 DIAGNOSIS — Z20828 Contact with and (suspected) exposure to other viral communicable diseases: Secondary | ICD-10-CM | POA: Insufficient documentation

## 2019-06-11 LAB — SARS CORONAVIRUS 2 (TAT 6-24 HRS): SARS Coronavirus 2: NEGATIVE

## 2019-06-11 NOTE — Telephone Encounter (Signed)
The patient's wife called this morning. She states the patient is wanting to know if his current pacemaker will need to be removed.  I advised if anything, the can would be replaced and the 3rd wire attached, but the current wires in his heart will stay where they are.   He would like it confirmed if he will need a new can. I advised I will need to ask Dr. Caryl Comes as I am unsure with the current one that he has.  He is not wanting to stay overnight. I advised that all patient's are told to plan on staying overnight and this was discussed with the patient by me when he was in clinic this week.  I advised any further discussion will need to be had with the patient at the time of his procedure with Dr. Caryl Comes.   All other questions were answered to the best of my ability. The patient's wife was aware and agreeable.   Will forward to Dr. Caryl Comes to review further.

## 2019-06-12 NOTE — Telephone Encounter (Signed)
The old can will be replaced with a new can that will allow [place] to use all three ( esp the new third) wire The other two wires will be used as well [employer]

## 2019-06-14 ENCOUNTER — Telehealth: Payer: Self-pay | Admitting: Pulmonary Disease

## 2019-06-14 NOTE — Telephone Encounter (Addendum)
Left message to make pt aware of date/time for covid test.  06/16/2019 prior to 11:00

## 2019-06-14 NOTE — Telephone Encounter (Signed)
Dr. Caryl Comes also responded to the patient via a MyChart message.

## 2019-06-15 ENCOUNTER — Encounter: Payer: Medicare Other | Admitting: Internal Medicine

## 2019-06-15 NOTE — Telephone Encounter (Signed)
Called and spoke to pt's spouse, Stanton Kidney ([place]). PFT has been cx per pt request, as pt's SOB was related to cardiac.   Nothing further is needed.

## 2019-06-16 ENCOUNTER — Encounter (HOSPITAL_COMMUNITY)
Admission: RE | Disposition: A | Discharge status: Home / Self Care | Payer: Self-pay | Source: Home / Self Care | Attending: Internal Medicine

## 2019-06-16 ENCOUNTER — Encounter (HOSPITAL_COMMUNITY): Payer: Self-pay | Admitting: Internal Medicine

## 2019-06-16 ENCOUNTER — Ambulatory Visit (HOSPITAL_COMMUNITY): Payer: Medicare Other

## 2019-06-16 ENCOUNTER — Ambulatory Visit (HOSPITAL_COMMUNITY)
Admission: RE | Admit: 2019-06-16 | Discharge: 2019-06-16 | Disposition: A | Discharge status: Home / Self Care | Payer: Medicare Other | Attending: Internal Medicine | Admitting: Internal Medicine

## 2019-06-16 ENCOUNTER — Other Ambulatory Visit: Payer: Self-pay

## 2019-06-16 DIAGNOSIS — Z79899 Other long term (current) drug therapy: Secondary | ICD-10-CM | POA: Insufficient documentation

## 2019-06-16 DIAGNOSIS — Z6838 Body mass index (BMI) 38.0-38.9, adult: Secondary | ICD-10-CM | POA: Diagnosis not present

## 2019-06-16 DIAGNOSIS — I4891 Unspecified atrial fibrillation: Secondary | ICD-10-CM | POA: Insufficient documentation

## 2019-06-16 DIAGNOSIS — E079 Disorder of thyroid, unspecified: Secondary | ICD-10-CM | POA: Diagnosis not present

## 2019-06-16 DIAGNOSIS — I110 Hypertensive heart disease with heart failure: Secondary | ICD-10-CM | POA: Insufficient documentation

## 2019-06-16 DIAGNOSIS — G4733 Obstructive sleep apnea (adult) (pediatric): Secondary | ICD-10-CM | POA: Insufficient documentation

## 2019-06-16 DIAGNOSIS — I442 Atrioventricular block, complete: Secondary | ICD-10-CM | POA: Diagnosis not present

## 2019-06-16 DIAGNOSIS — Z7901 Long term (current) use of anticoagulants: Secondary | ICD-10-CM | POA: Diagnosis not present

## 2019-06-16 DIAGNOSIS — I5042 Chronic combined systolic (congestive) and diastolic (congestive) heart failure: Secondary | ICD-10-CM | POA: Insufficient documentation

## 2019-06-16 DIAGNOSIS — E669 Obesity, unspecified: Secondary | ICD-10-CM | POA: Diagnosis not present

## 2019-06-16 DIAGNOSIS — Z950 Presence of cardiac pacemaker: Secondary | ICD-10-CM | POA: Diagnosis not present

## 2019-06-16 HISTORY — PX: BIV UPGRADE: EP1202

## 2019-06-16 LAB — SURGICAL PCR SCREEN
MRSA, PCR: NEGATIVE
Staphylococcus aureus: NEGATIVE

## 2019-06-16 SURGERY — BIV UPGRADE

## 2019-06-16 MED ORDER — LIDOCAINE HCL 1 % IJ SOLN
INTRAMUSCULAR | Status: AC
  Filled 2019-06-16: qty 60

## 2019-06-16 MED ORDER — HEPARIN (PORCINE) IN NACL 1000-0.9 UT/500ML-% IV SOLN
INTRAVENOUS | Status: AC
  Filled 2019-06-16: qty 500

## 2019-06-16 MED ORDER — IOHEXOL 350 MG/ML SOLN
INTRAVENOUS | Status: DC | PRN
  Administered 2019-06-16: 40 mL via INTRAVENOUS

## 2019-06-16 MED ORDER — SODIUM CHLORIDE 0.9 % IV SOLN
INTRAVENOUS | Status: DC
  Administered 2019-06-16: 08:00:00 via INTRAVENOUS

## 2019-06-16 MED ORDER — MIDAZOLAM HCL 5 MG/5ML IJ SOLN
INTRAMUSCULAR | Status: AC
  Filled 2019-06-16: qty 5

## 2019-06-16 MED ORDER — CHLORHEXIDINE GLUCONATE 4 % EX LIQD: 60.0000 mL | Freq: Once | CUTANEOUS | Status: DC

## 2019-06-16 MED ORDER — LIDOCAINE HCL (PF) 1 % IJ SOLN
INTRAMUSCULAR | Status: DC | PRN
  Administered 2019-06-16: 50 mL

## 2019-06-16 MED ORDER — MIDAZOLAM HCL 5 MG/5ML IJ SOLN
INTRAMUSCULAR | Status: DC | PRN
  Administered 2019-06-16 (×5): 1 mg via INTRAVENOUS

## 2019-06-16 MED ORDER — CEFAZOLIN SODIUM-DEXTROSE 2-4 GM/100ML-% IV SOLN
INTRAVENOUS | Status: AC
  Filled 2019-06-16: qty 100

## 2019-06-16 MED ORDER — MUPIROCIN 2 % EX OINT
TOPICAL_OINTMENT | CUTANEOUS | Status: AC
  Administered 2019-06-16: 08:00:00
  Filled 2019-06-16: qty 22

## 2019-06-16 MED ORDER — SODIUM CHLORIDE 0.9 % IV SOLN
INTRAVENOUS | Status: AC
  Filled 2019-06-16: qty 2

## 2019-06-16 MED ORDER — FENTANYL CITRATE (PF) 100 MCG/2ML IJ SOLN
INTRAMUSCULAR | Status: DC | PRN
  Administered 2019-06-16: 12.5 ug via INTRAVENOUS
  Administered 2019-06-16 (×2): 25 ug via INTRAVENOUS
  Administered 2019-06-16: 12.5 ug via INTRAVENOUS
  Administered 2019-06-16: 25 ug via INTRAVENOUS

## 2019-06-16 MED ORDER — CEFAZOLIN SODIUM-DEXTROSE 2-4 GM/100ML-% IV SOLN
2.0000 g | INTRAVENOUS | Status: AC
  Administered 2019-06-16: 09:00:00 2 g via INTRAVENOUS

## 2019-06-16 MED ORDER — ONDANSETRON HCL 4 MG/2ML IJ SOLN: 4.0000 mg | Freq: Four times a day (QID) | INTRAMUSCULAR | Status: DC | PRN

## 2019-06-16 MED ORDER — ACETAMINOPHEN 325 MG PO TABS: 325.0000 mg | ORAL_TABLET | ORAL | Status: DC | PRN

## 2019-06-16 MED ORDER — FENTANYL CITRATE (PF) 100 MCG/2ML IJ SOLN
INTRAMUSCULAR | Status: AC
  Filled 2019-06-16: qty 2

## 2019-06-16 MED ORDER — MUPIROCIN 2 % EX OINT
1.0000 "application " | TOPICAL_OINTMENT | Freq: Once | CUTANEOUS | Status: DC
  Filled 2019-06-16: qty 22

## 2019-06-16 MED ORDER — HEPARIN (PORCINE) IN NACL 1000-0.9 UT/500ML-% IV SOLN
INTRAVENOUS | Status: DC | PRN
  Administered 2019-06-16: 500 mL

## 2019-06-16 MED ORDER — SODIUM CHLORIDE 0.9 % IV SOLN
80.0000 mg | INTRAVENOUS | Status: AC
  Administered 2019-06-16: 11:00:00 80 mg
  Filled 2019-06-16: qty 2

## 2019-06-16 SURGICAL SUPPLY — 19 items
BALLN ATTAIN 80 (BALLOONS) ×2
BALLN ATTAIN 80CM 6215 (BALLOONS) ×1
BALLOON ATTAIN 80 (BALLOONS) IMPLANT
CABLE SURGICAL S-101-97-12 (CABLE) ×3 IMPLANT
CATH ATTAIN SEL SURV 6248V-130 (CATHETERS) ×2 IMPLANT
CATH CPS DIRECT 135 DS2C020 (CATHETERS) ×2 IMPLANT
DEVICE CRTP PERCEPTA QUAD MRI (Pacemaker) ×2 IMPLANT
GUIDEWIRE ANGLED .035X150CM (WIRE) ×2 IMPLANT
HEMOSTAT SURGICEL 2X4 FIBR (HEMOSTASIS) ×2 IMPLANT
KIT MICROPUNCTURE NIT STIFF (SHEATH) ×2 IMPLANT
LEAD QUARTET 1456Q-86 (Lead) IMPLANT
PAD PRO RADIOLUCENT 2001M-C (PAD) ×3 IMPLANT
QUARTET 1456Q-86 (Lead) ×3 IMPLANT
SHEATH 9.5FR PRELUDE SNAP 13 (SHEATH) ×2 IMPLANT
SLITTER UNIVERSAL DS2A003 (MISCELLANEOUS) ×2 IMPLANT
TRAY PACEMAKER INSERTION (PACKS) ×3 IMPLANT
WIRE ACUITY WHISPER EDS 4648 (WIRE) ×10 IMPLANT
WIRE ASAHI SION 190X3X12 .014 (WIRE) ×2 IMPLANT
WIRE HI TORQ VERSACORE-J 145CM (WIRE) ×2 IMPLANT

## 2019-06-16 NOTE — Progress Notes (Signed)
Continues to move left arm even after told not to. Noted to remove from the sling times 2. Replaced both times. Dr Caryl Comes in to see pt and instructed pt not to move left arm. Also Dr Caryl Comes states no need to monitor pt on tele.

## 2019-06-16 NOTE — Progress Notes (Signed)
Dr Caryl Comes in; Faythe Ghee to d/c home per Dr Caryl Comes

## 2019-06-16 NOTE — Interval H&P Note (Signed)
History and Physical Interval Note:  06/16/2019 7:42 AM  William Woods.  has presented today for surgery, with the diagnosis of heart block - heart failure.  The various methods of treatment have been discussed with the patient and family. After consideration of risks, benefits and other options for treatment, the patient has consented to  Procedure(s): BIVP UPGRADE - BIVPacemake Upgrade (N/A) as a surgical intervention.  The patient's history has been reviewed, patient examined, no change in status, stable for surgery.  I have reviewed the patient's chart and labs.  Questions were answered to the patient's satisfaction.     Virl Axe  interval 12 lb diuresis   Not much better  Questions answered  Some reluctance for sedation, but agreeable

## 2019-06-16 NOTE — Progress Notes (Signed)
Patient placed on CPAP for procedure. Stated he wears full face mask at home, but was unsure of his settings. Also stated that he wears no O2 bleed in. Placed on Auto Titrate and 4L bleed in per request of staff due to sedation. Patient stated he is comfortable with that. Vitals WNL.

## 2019-06-16 NOTE — Progress Notes (Signed)
Pt noted to move left arm about. Instructed to limit movement ot the left arm.

## 2019-06-16 NOTE — Progress Notes (Signed)
Client out of bed; arm out of sling, dressing himself; has pants on; advised to keep left arm in sling and not use right arm

## 2019-06-16 NOTE — Progress Notes (Signed)
CXR Personally reviewed  Lead position stable Device interrogation >> stable LV pacing parameters  Pt non compliant with recommendations  Instructions given

## 2019-06-16 NOTE — Progress Notes (Signed)
Discharge instructions reviewed with pt wife voices understanding.

## 2019-06-16 NOTE — Discharge Instructions (Signed)
Acknowledgement of Risk of Discharge  Against Medical Advice   And Release of Liability    Because I am choosing to leave the hospital in spite of these risks, I release the hospital, its employees and officers, and my attending physician from all liability for any adverse results caused by my leaving the hospital prematurely.   ________________________________      _____________________________________ Patient's Signature                                        Date and Time   ________________________________      _____________________________________ Witness' Signature                                        Relationship to Patient    ________________________________      _____________________________________ Witness' Signature                                        Relationship to Patient   [If patient refuses to sign, write "Patient refused to sign" on the patient's signature line and have witnesses to the refusal sign as witnesses.]      Pacemaker Implantation, Adult Pacemaker implantation is a procedure to place a pacemaker inside your chest. A pacemaker is a small computer that sends electrical signals to the heart and helps your heart beat normally. A pacemaker also stores information about your heart rhythms. You may need pacemaker implantation if you:  Have a slow heartbeat (bradycardia).  Faint (syncope).  Have shortness of breath (dyspnea) due to heart problems. The pacemaker attaches to your heart through a wire, called a lead. Sometimes just one lead is needed. Other times, there will be two leads. There are two types of pacemakers:  Transvenous pacemaker. This type is placed under the skin or muscle of your chest. The lead goes through a vein in the chest area to reach the inside of the heart.  Epicardial pacemaker. This type is placed under the skin or muscle of your chest or belly. The lead goes through your chest to the outside of the heart. Tell a health  care provider about:  Any allergies you have.  All medicines you are taking, including vitamins, herbs, eye drops, creams, and over-the-counter medicines.  Any problems you or family members have had with anesthetic medicines.  Any blood or bone disorders you have.  Any surgeries you have had.  Any medical conditions you have.  Whether you are pregnant or may be pregnant. What are the risks? Generally, this is a safe procedure. However, problems may occur, including:  Infection.  Bleeding.  Failure of the pacemaker or the lead.  Collapse of a lung or bleeding into a lung.  Blood clot inside a blood vessel with a lead.  Damage to the heart.  Infection inside the heart (endocarditis).  Allergic reactions to medicines. What happens before the procedure? Staying hydrated Follow instructions from your health care provider about hydration, which may include:  Up to 2 hours before the procedure - you may continue to drink clear liquids, such as water, clear fruit juice, black coffee, and plain tea. Eating and drinking restrictions Follow instructions from your health care provider about eating and  drinking, which may include:  8 hours before the procedure - stop eating heavy meals or foods such as meat, fried foods, or fatty foods.  6 hours before the procedure - stop eating light meals or foods, such as toast or cereal.  6 hours before the procedure - stop drinking milk or drinks that contain milk.  2 hours before the procedure - stop drinking clear liquids. Medicines  Ask your health care provider about: ? Changing or stopping your regular medicines. This is especially important if you are taking diabetes medicines or blood thinners. ? Taking medicines such as aspirin and ibuprofen. These medicines can thin your blood. Do not take these medicines before your procedure if your health care provider instructs you not to.  You may be given antibiotic medicine to help  prevent infection. General instructions  You will have a heart evaluation. This may include an electrocardiogram (ECG), chest X-ray, and heart imaging (echocardiogram,  or echo) tests.  You will have blood tests.  Do not use any products that contain nicotine or tobacco, such as cigarettes and e-cigarettes. If you need help quitting, ask your health care provider.  Plan to have someone take you home from the hospital or clinic.  If you will be going home right after the procedure, plan to have someone with you for 24 hours.  Ask your health care provider how your surgical site will be marked or identified. What happens during the procedure?  To reduce your risk of infection: ? Your health care team will wash or sanitize their hands. ? Your skin will be washed with soap. ? Hair may be removed from the surgical area.  An IV tube will be inserted into one of your veins.  You will be given one or more of the following: ? A medicine to help you relax (sedative). ? A medicine to numb the area (local anesthetic). ? A medicine to make you fall asleep (general anesthetic).  If you are getting a transvenous pacemaker: ? An incision will be made in your upper chest. ? A pocket will be made for the pacemaker. It may be placed under the skin or between layers of muscle. ? The lead will be inserted into a blood vessel that returns to the heart. ? While X-rays are taken by an imaging machine (fluoroscopy), the lead will be advanced through the vein to the inside of your heart. ? The other end of the lead will be tunneled under the skin and attached to the pacemaker.  If you are getting an epicardial pacemaker: ? An incision will be made near your ribs or breastbone (sternum) for the lead. ? The lead will be attached to the outside of your heart. ? Another incision will be made in your chest or upper belly to create a pocket for the pacemaker. ? The free end of the lead will be tunneled under  the skin and attached to the pacemaker.  The transvenous or epicardial pacemaker will be tested. Imaging studies may be done to check the lead position.  The incisions will be closed with stitches (sutures), adhesive strips, or skin glue.  Bandages (dressing) will be placed over the incisions. The procedure may vary among health care providers and hospitals. What happens after the procedure?  Your blood pressure, heart rate, breathing rate, and blood oxygen level will be monitored until the medicines you were given have worn off.  You will be given antibiotics and pain medicine.  ECG and chest x-rays will  be done.  You will wear a continuous type of ECG (Holter monitor) to check your heart rhythm.  Your health care provider will program the pacemaker.  Do not drive for 24 hours if you received a sedative. This information is not intended to replace advice given to you by your health care provider. Make sure you discuss any questions you have with your health care provider. Document Released: 09/27/2002 Document Revised: 06/26/2018 Document Reviewed: 03/20/2016 Elsevier Patient Education  [employer] After This sheet gives you information about how to care for yourself after your procedure. Your health care provider may also give you more specific instructions. If you have problems or questions, contact your health care provider. What can I expect after the procedure? After your procedure, it is common to have:  Pain or soreness at the site where the pacemaker was inserted.  Swelling at the site where the pacemaker was inserted. Follow these instructions at home: Incision care   Keep the incision clean and dry. ? Do not take baths, swim, or use a hot tub until your health care provider approves. ? You may shower the day after your procedure, or as directed by your health care provider. ? Pat the area dry with a clean towel. Do not rub the  area. This may cause bleeding.  Follow instructions from your health care provider about how to take care of your incision. Make sure you: ? Wash your hands with soap and water before you change your bandage (dressing). If soap and water are not available, use hand sanitizer. ? Change your dressing as told by your health care provider. ? Leave stitches (sutures), skin glue, or adhesive strips in place. These skin closures may need to stay in place for 2 weeks or longer. If adhesive strip edges start to loosen and curl up, you may trim the loose edges. Do not remove adhesive strips completely unless your health care provider tells you to do that.  Check your incision area every day for signs of infection. Check for: ? More redness, swelling, or pain. ? More fluid or blood. ? Warmth. ? Pus or a bad smell. Activity  Do not lift anything that is heavier than 10 lb (4.5 kg) until your health care provider says it is okay to do so.  For the first 2 weeks, or as long as told by your health care provider: ? Avoid lifting your left arm higher than your shoulder. ? Be gentle when you move your arms over your head. It is okay to raise your arm to comb your hair. ? Avoid strenuous exercise.  Ask your health care provider when it is okay to: ? Resume your normal activities. ? Return to work or school. ? Resume sexual activity. Eating and drinking  Eat a heart-healthy diet. This should include plenty of fresh fruits and vegetables, whole grains, low-fat dairy products, and lean protein like chicken and fish.  Limit alcohol intake to no more than 1 drink a day for non-pregnant women and 2 drinks a day for men. One drink equals 12 oz of beer, 5 oz of wine, or 1 oz of hard liquor.  Check ingredients and nutrition facts on packaged foods and beverages. Avoid the following types of food: ? Food that is high in salt (sodium). ? Food that is high in saturated fat, like full-fat dairy or red  meat. ? Food that is high in trans fat, like fried food. ? Food and drinks  that are high in sugar. Lifestyle  Do not use any products that contain nicotine or tobacco, such as cigarettes and e-cigarettes. If you need help quitting, ask your health care provider.  Take steps to manage and control your weight.  Get regular exercise. Aim for 150 minutes of moderate-intensity exercise (such as walking or yoga) or 75 minutes of vigorous exercise (such as running or swimming) each week.  Manage other health problems, such as diabetes or high blood pressure. Ask your health care provider how you can manage these conditions. General instructions  Do not drive for 24 hours after your procedure if you were given a medicine to help you relax (sedative).  Take over-the-counter and prescription medicines only as told by your health care provider.  Avoid putting pressure on the area where the pacemaker was placed.  If you need an MRI after your pacemaker has been placed, be sure to tell the health care provider who orders the MRI that you have a pacemaker.  Avoid close and prolonged exposure to electrical devices that have strong magnetic fields. These include: ? Cell phones. Avoid keeping them in a pocket near the pacemaker, and try using the ear opposite the pacemaker. ? MP3 players. ? Household appliances, like microwaves. ? Metal detectors. ? Electric generators. ? High-tension wires.  Keep all follow-up visits as directed by your health care provider. This is important. Contact a health care provider if:  You have pain at the incision site that is not relieved by over-the-counter or prescription medicines.  You have any of these around your incision site or coming from it: ? More redness, swelling, or pain. ? Fluid or blood. ? Warmth to the touch. ? Pus or a bad smell.  You have a fever.  You feel brief, occasional palpitations, light-headedness, or any symptoms that you think might  be related to your heart. Get help right away if:  You experience chest pain that is different from the pain at the pacemaker site.  You develop a red streak that extends above or below the incision site.  You experience shortness of breath.  You have palpitations or an irregular heartbeat.  You have light-headedness that does not go away quickly.  You faint or have dizzy spells.  Your pulse suddenly drops or increases rapidly and does not return to normal.  You begin to gain weight and your legs and ankles swell. Summary  After your procedure, it is common to have pain, soreness, and some swelling where the pacemaker was inserted.  Make sure to keep your incision clean and dry. Follow instructions from your health care provider about how to take care of your incision.  Check your incision every day for signs of infection, such as more pain or swelling, pus or a bad smell, warmth, or leaking fluid and blood.  Avoid strenuous exercise and lifting your left arm higher than your shoulder for 2 weeks, or as long as told by your health care provider. This information is not intended to replace advice given to you by your health care provider. Make sure you discuss any questions you have with your health care provider. Document Released: 07/28/2013 Document Revised: 09/19/2017 Document Reviewed: 08/29/2016 Elsevier Patient Education  2020 [employer].

## 2019-06-17 ENCOUNTER — Ambulatory Visit: Payer: Medicare Other

## 2019-06-17 ENCOUNTER — Encounter (HOSPITAL_COMMUNITY): Payer: Self-pay | Admitting: Internal Medicine

## 2019-06-21 ENCOUNTER — Ambulatory Visit: Payer: Medicare Other | Admitting: Pulmonary Disease

## 2019-06-22 ENCOUNTER — Encounter (HOSPITAL_COMMUNITY): Payer: Self-pay | Admitting: Internal Medicine

## 2019-07-01 ENCOUNTER — Ambulatory Visit (INDEPENDENT_AMBULATORY_CARE_PROVIDER_SITE_OTHER): Payer: Medicare Other | Admitting: *Deleted

## 2019-07-01 ENCOUNTER — Other Ambulatory Visit: Payer: Self-pay

## 2019-07-01 DIAGNOSIS — I442 Atrioventricular block, complete: Secondary | ICD-10-CM

## 2019-07-01 LAB — CUP PACEART INCLINIC DEVICE CHECK
Battery Remaining Longevity: 109 mo
Battery Voltage: 3.2 V
Brady Statistic AP VP Percent: 34.57 %
Brady Statistic AP VS Percent: 0.01 %
Brady Statistic AS VP Percent: 63.1 %
Brady Statistic AS VS Percent: 2.2 %
Brady Statistic RA Percent Paced: 21.98 %
Brady Statistic RV Percent Paced: 98.41 %
Date Time Interrogation Session: 20200910160405
Implantable Lead Implant Date: 20130416
Implantable Lead Implant Date: 20130416
Implantable Lead Implant Date: 20200826
Implantable Lead Location: 753858
Implantable Lead Location: 753859
Implantable Lead Location: 753860
Implantable Lead Model: 5076
Implantable Lead Model: 5076
Implantable Pulse Generator Implant Date: 20200826
Lead Channel Impedance Value: 1121 Ohm
Lead Channel Impedance Value: 1121 Ohm
Lead Channel Impedance Value: 1140 Ohm
Lead Channel Impedance Value: 304 Ohm
Lead Channel Impedance Value: 361 Ohm
Lead Channel Impedance Value: 399 Ohm
Lead Channel Impedance Value: 418 Ohm
Lead Channel Impedance Value: 969 Ohm
Lead Channel Impedance Value: 988 Ohm
Lead Channel Impedance Value: 988 Ohm
Lead Channel Pacing Threshold Amplitude: 1 V
Lead Channel Pacing Threshold Amplitude: 1 V
Lead Channel Pacing Threshold Amplitude: 1 V
Lead Channel Pacing Threshold Pulse Width: 0.4 ms
Lead Channel Pacing Threshold Pulse Width: 0.4 ms
Lead Channel Pacing Threshold Pulse Width: 1 ms
Lead Channel Sensing Intrinsic Amplitude: 2.5 mV
Lead Channel Setting Pacing Amplitude: 1.75 V
Lead Channel Setting Pacing Amplitude: 2.5 V
Lead Channel Setting Pacing Amplitude: 2.5 V
Lead Channel Setting Pacing Pulse Width: 0.4 ms
Lead Channel Setting Pacing Pulse Width: 1 ms
Lead Channel Setting Sensing Sensitivity: 4 mV

## 2019-07-01 NOTE — Progress Notes (Signed)
Wound check /CRT-P device check in clinic. Derma bond removed. Wound edges approximated with no redness, drainage or edema. Normal device function. Thresholds, sensing, impedance consistent with previous measurements. Histograms appropriate for patient and level of activity. 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 + Eliquis No ventricular high rate episodes. Patient bi-ventricularly pacing 100 % of the time. Device programmed with appropriate safety margins. Device heart failure diagnostics are within normal limits and stable over time. Estimated longevity 9 yrs 1 mo. Patient enrolled in remote follow-up, next remote check 12/27/19. F/U with Dr Caryl Comes on 09/21/19.

## 2019-09-21 ENCOUNTER — Other Ambulatory Visit: Payer: Self-pay

## 2019-09-21 ENCOUNTER — Encounter: Payer: Self-pay | Admitting: Internal Medicine

## 2019-09-21 ENCOUNTER — Ambulatory Visit (INDEPENDENT_AMBULATORY_CARE_PROVIDER_SITE_OTHER): Payer: Medicare Other | Admitting: Internal Medicine

## 2019-09-21 VITALS — BP 124/82 | HR 76 | Temp 96.6°F | Ht 72.0 in | Wt 300.2 lb

## 2019-09-21 DIAGNOSIS — I442 Atrioventricular block, complete: Secondary | ICD-10-CM | POA: Diagnosis not present

## 2019-09-21 DIAGNOSIS — Z950 Presence of cardiac pacemaker: Secondary | ICD-10-CM

## 2019-09-21 DIAGNOSIS — I5042 Chronic combined systolic (congestive) and diastolic (congestive) heart failure: Secondary | ICD-10-CM | POA: Diagnosis not present

## 2019-09-21 DIAGNOSIS — I429 Cardiomyopathy, unspecified: Secondary | ICD-10-CM | POA: Diagnosis not present

## 2019-09-21 DIAGNOSIS — I480 Paroxysmal atrial fibrillation: Secondary | ICD-10-CM

## 2019-09-21 NOTE — Patient Instructions (Addendum)
Medication Instructions:  - Your physician recommends that you continue on your current medications as directed. Please refer to the Current Medication list given to you today.  *If you need a refill on your cardiac medications before your next appointment, please call your pharmacy*  Lab Work: - none ordered  If you have labs (blood work) drawn today and your tests are completely normal, you will receive your results only by: Marland Kitchen MyChart Message (if you have MyChart) OR . A paper copy in the mail If you have any lab test that is abnormal or we need to change your treatment, we will call you to review the results.  Testing/Procedures: - Your physician has requested that you have an echocardiogram- in 3 months (same day of your appointment with Dr. Caryl Comes). Echocardiography is a painless test that uses sound waves to create images of your heart. It provides your doctor with information about the size and shape of your heart and how well your heart's chambers and valves are working. This procedure takes approximately one hour. There are no restrictions for this procedure.  Follow-Up: At [facility], you and your health needs are our priority.  As part of our continuing mission to provide you with exceptional heart care, we have created designated Provider Care Teams.  These Care Teams include your primary Cardiologist (physician) and Advanced Practice Providers (APPs -  Physician Assistants and Nurse Practitioners) who all work together to provide you with the care you need, when you need it.  Your next appointment:   3 month(s)  The format for your next appointment:   In Person  Provider:   Virl Axe, MD  Other Instructions n/a

## 2019-09-21 NOTE — Progress Notes (Signed)
Patient Care Team: [name], DO as PCP - General (Family Medicine)   HPI  William Gable. is a 72 y.o. male Seen for chief complaint for pacemaker implanted for symptomatic intermittent complete heart block 2013.  History of syncope with a prolonged QT interval and he had undergone loop recorder insertion. With recurrent syncope heart block was identified.   Treated with beta blockers for hypertension and long QT,  stopped this and his now on losartan   Diagnosed with Sleep apnea and has been improved with CPAP   BP has been much better since the control of his sleep apnea     Saw Pulm 7/20 with complaints of new onset (4-57m) SOB  Eval > PFTs and echo  As Below  Underwent CRT upgrade 8/20  Significantly less dyspneic some edema but this is better also.  No chest pain.  Device well-healed  DATE TEST EF   2006 LHC 60-65 %   7/20 Echo   20-25 %            Date Cr K Hgb  12/18 1.13 4.3 14.5   2/20 1.14 4.3 14.1     He has  told me of the death of his two sons, one from cancer and one from cocaine overdose  And Gods calling him back via [employer] so [employer] that he Gave Works now with  [employer]    Past Medical History:  Diagnosis Date  . CHB (complete heart block) ([place])   . Edema    Suspect diastolic heart failure  . Hypertension   . Long Q-T syndrome   . Obesity (BMI 30-39.9)   . Obstructive sleep apnea   . Pacemaker    Medtronic Adapta L model ADDRL 1 (serial number [id number]) pacemaker.  . Syncope   . Thyroid disease     Past Surgical History:  Procedure Laterality Date  . BIV UPGRADE N/A 06/16/2019   Procedure: BIVP UPGRADE - BIVPacemake Upgrade;  Surgeon: Deboraha Sprang, MD;  Location: [facility];  Service: Cardiovascular;  Laterality: N/A;  . CARDIAC CATHETERIZATION     [facility]   . CARDIAC CATHETERIZATION  01/2012   [facility]: no significant CAD, EF 40%.   Marland Kitchen LOOP RECORDER EXPLANT N/A 02/04/2012   Procedure: LOOP  RECORDER EXPLANT;  Surgeon: Thompson Grayer, MD;  Location: [facility] CATH LAB;  Service: Cardiovascular;  Laterality: N/A;  . LOOP RECORDER IMPLANT N/A 01/21/2012   Procedure: LOOP RECORDER IMPLANT;  Surgeon: Deboraha Sprang, MD;  Location: [facility] CATH LAB;  Service: Cardiovascular;  Laterality: N/A;  . OTHER SURGICAL HISTORY  02/04/2012    removal of a previously implanted MDT Reveal XT implantable loop recorder  . PACEMAKER INSERTION  02/04/2012   Medtronic Adapta L model ADDRL 1 (serial number [id number]) pacemaker.  Marland Kitchen PERMANENT PACEMAKER INSERTION N/A 02/04/2012   Procedure: PERMANENT PACEMAKER INSERTION;  Surgeon: Thompson Grayer, MD;  [facility] C[facility] LAB;  Service: Cardiovascular;  Laterality: N/A;    Current Outpatient Medications  Medication Sig Dispense Refill  . apixaban (ELIQUIS) 5 MG TABS tablet Take 1 tablet (5 mg total) by mouth 2 (two) times daily. 60 tablet 6  . Ascorbic Acid (VITAMIN C) 1000 MG tablet Take 1,000 mg by mouth 2 (two) times daily.    . furosemide (LASIX) 40 MG tablet Take 1 tablet (40 mg total) by mouth daily. (Patient taking differently: Take 40 mg by mouth See admin instructions. Take 1 tablet (  40 mg) by mouth twice daily (morning & lunch) x 5 days, ending on 06/16/2019--then resume 1 tablet (40 mg) by mouth daily in the morning) 90 tablet 3  . losartan (COZAAR) 25 MG tablet Take 1 tablet (25 mg total) by mouth daily. 90 tablet 3  . metoprolol succinate (TOPROL-XL) 25 MG 24 hr tablet Take 1 tablet (25 mg total) by mouth daily. 90 tablet 3  . potassium chloride (K-DUR) 10 MEQ tablet Take 1 tablet (10 mEq total) by mouth daily. 90 tablet 3   No current facility-administered medications for this visit.     Allergies  Allergen Reactions  . Other     LONG QT SYNDROME--MANY DRUGS PATIENT HAS TO AVOID SEE WEBSITE FOR DETAILS Http://www.sads.org.uk/drugs_to_avoid.htm     Review of Systems negative except from HPI and PMH  Physical Exam BP 124/82 (BP Location: Left Arm,  Patient Position: Sitting, Cuff Size: Normal)   Pulse 76   Temp (!) 96.6 F (35.9 C)   Ht 6' (1.829 m)   Wt (!) 300 lb 4 oz (136.2 kg)   BMI 40.72 kg/m  Well developed and nourished in no acute distress HENT normal Neck supple with JVP-  flat   Clear Regular rate and rhythm, no murmurs or gallops Abd-soft   No Clubbing cyanosis 1+ edema Skin-warm and dry A & Oriented  Grossly normal sensory and motor function  ECG atrial fibrillation with pacing with negative QRS V1 RS in lead V1 frequent PVCs       Assessment and  Plan  Complete Heart block    Cardiomyopathy-new  Congestive heart failure-chronic-/acute--systolic/diastolic  Pacemaker--CRT-D Medtronic  The patient's device was interrogated and the information was fully reviewed.  The device was reprogrammed to maximize longevity and to decrease diaphragmatic stimulation  OSA      HTN  Atrial fibrillation-paroxysmal  Less atrial fibrillation--this may be improving secondary to improved hemodynamics secondary to CRT right now we will hold off on antiarrhythmic therapy  On Anticoagulation;  No bleeding issues   Functional status significantly improved  Continue him on his current medications.  Would anticipate the addition of Aldactone at his next visit if echocardiogram dictates   We spent more than 50% of our >25 min visit in face to face counseling regarding the above

## 2019-10-03 IMAGING — CR CHEST - 2 VIEW
2 series · 2 of 2 positions shown · non-contrast
Comparison: Radiographs April 22, 2019.

CLINICAL DATA: Pacemaker revision.

EXAM:
CHEST - 2 VIEW

[w chest pa]
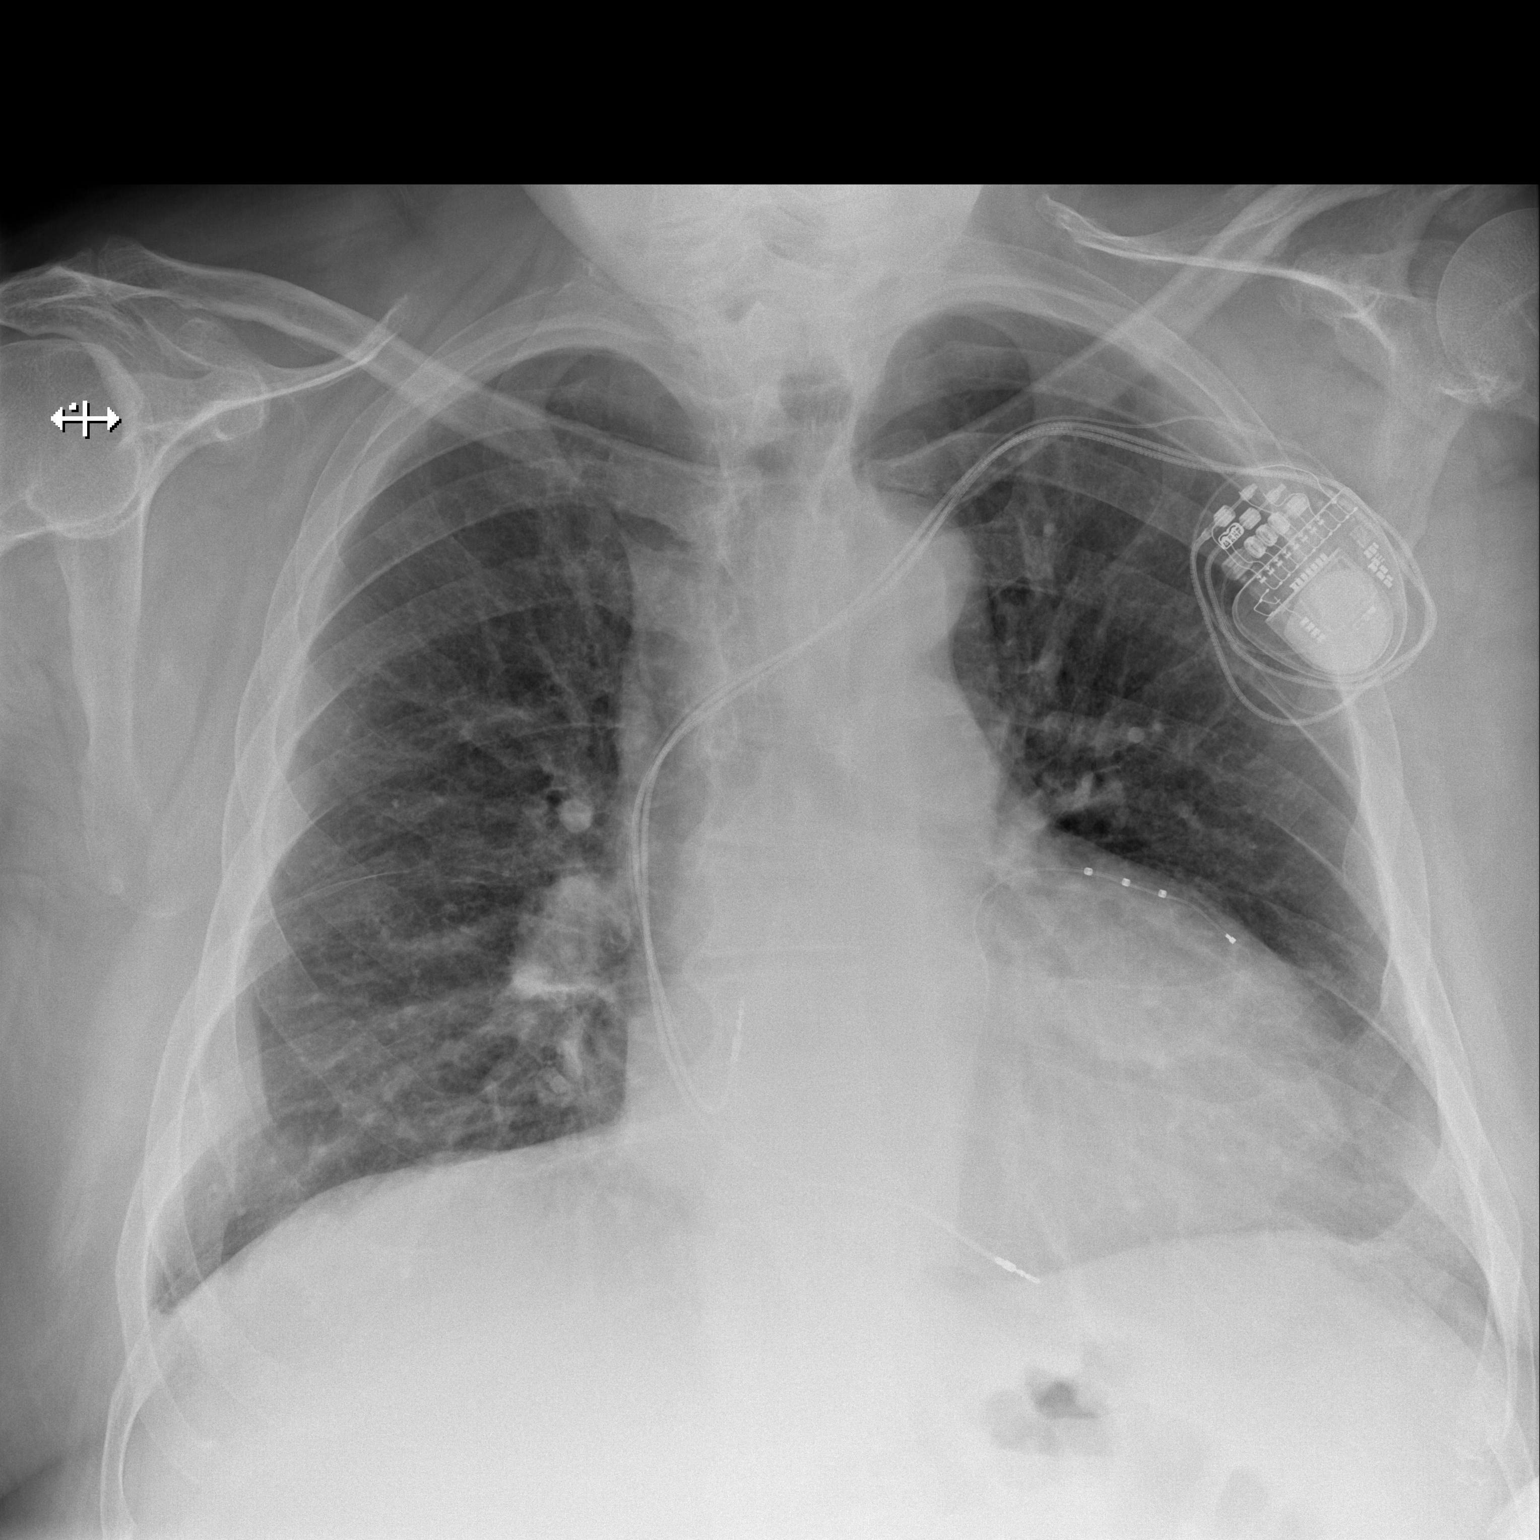

[w chest lat]
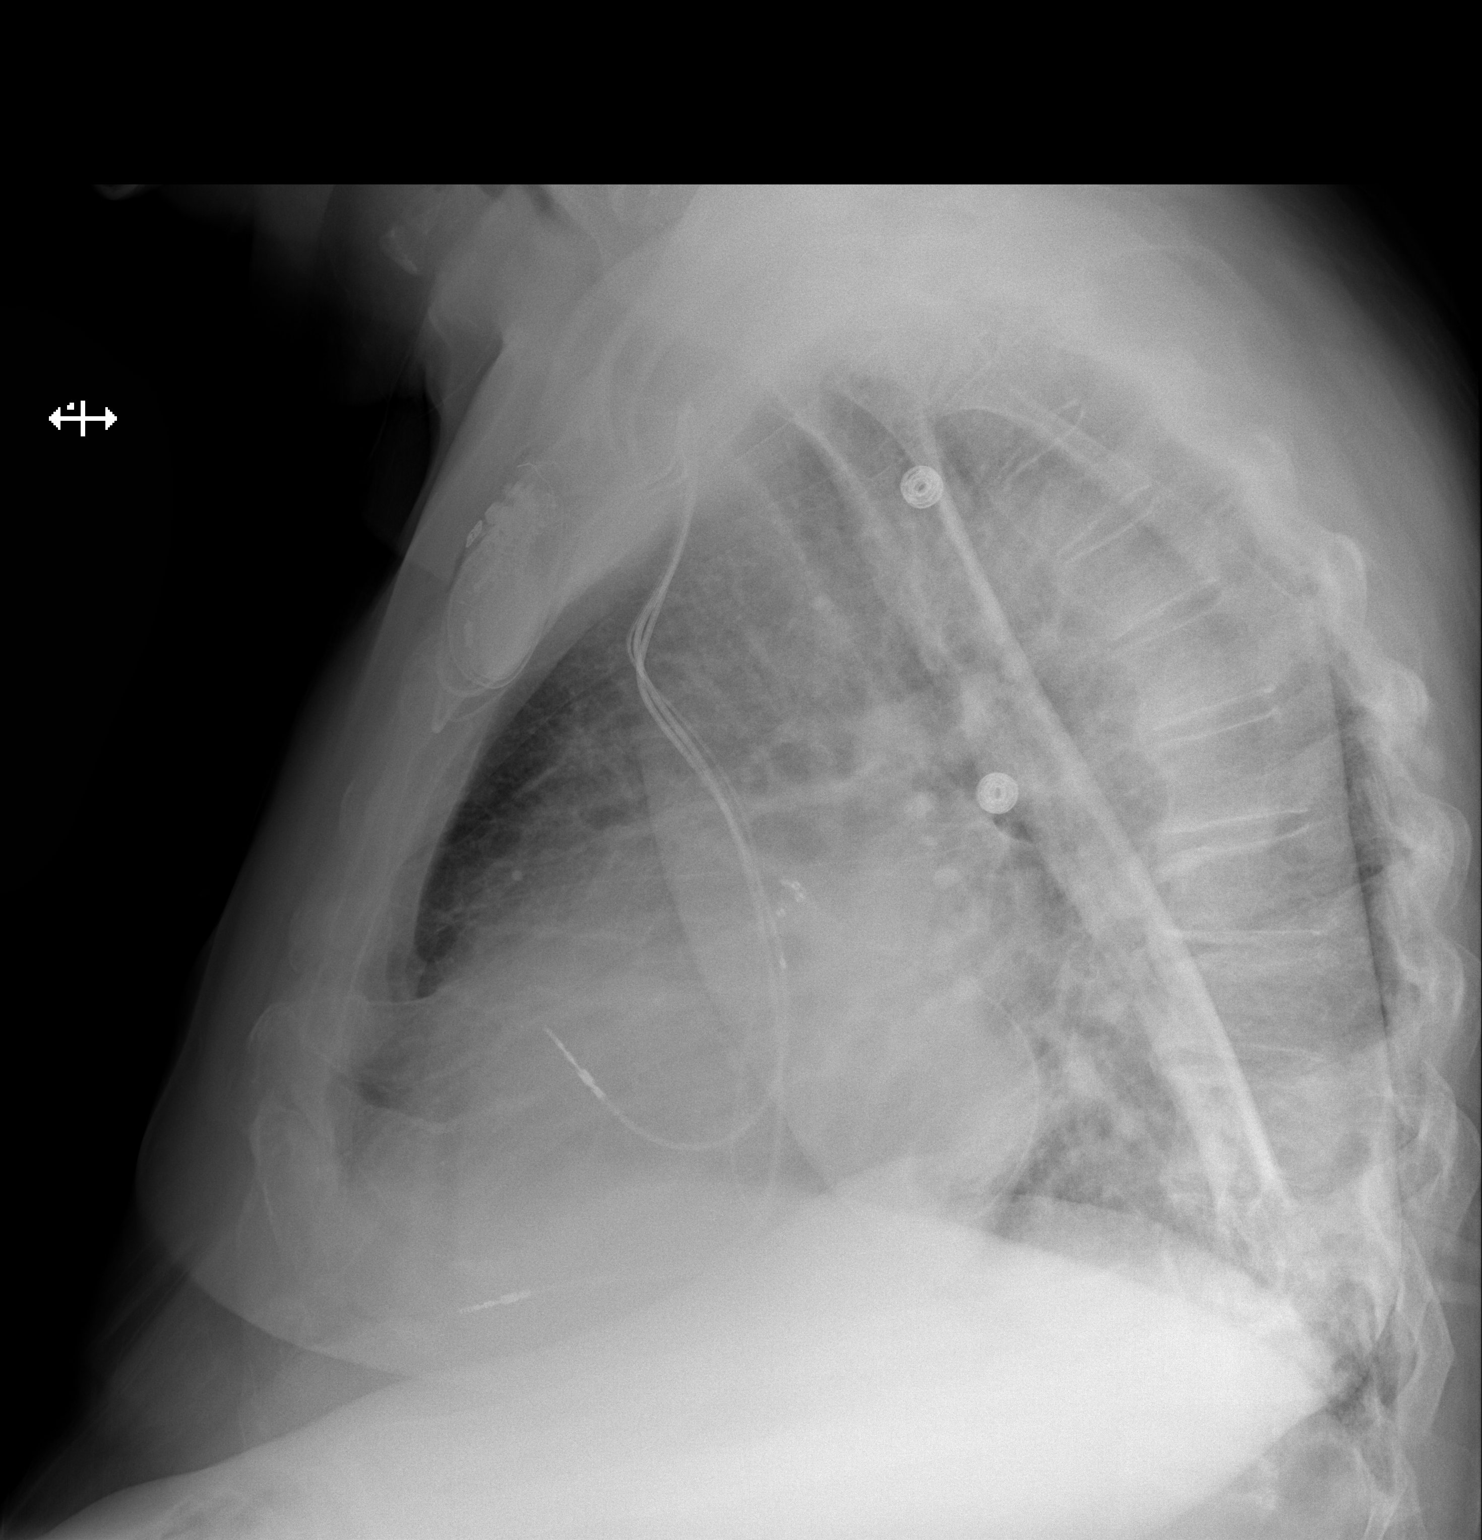

[2 of 2 positions shown; findings below may reference images not displayed]

FINDINGS: Stable cardiomediastinal silhouette. Left-sided pacemaker is noted
with leads in grossly good position. No pneumothorax or pleural
effusion is noted. No acute pulmonary disease is noted. Bony thorax
is unremarkable.
IMPRESSION: Left-sided pacemaker is noted in grossly good position. No
pneumothorax is noted.

## 2019-10-07 LAB — CUP PACEART INCLINIC DEVICE CHECK
Battery Remaining Longevity: 108 mo
Battery Voltage: 3.13 V
Brady Statistic AP VP Percent: 60.23 %
Brady Statistic AP VS Percent: 0.04 %
Brady Statistic AS VP Percent: 36.8 %
Brady Statistic AS VS Percent: 2.91 %
Brady Statistic RA Percent Paced: 55.92 %
Brady Statistic RV Percent Paced: 97.17 %
Date Time Interrogation Session: 20201201112600
Implantable Lead Implant Date: 20130416
Implantable Lead Implant Date: 20130416
Implantable Lead Implant Date: 20200826
Implantable Lead Location: 753858
Implantable Lead Location: 753859
Implantable Lead Location: 753860
Implantable Lead Model: 5076
Implantable Lead Model: 5076
Implantable Pulse Generator Implant Date: 20200826
Lead Channel Impedance Value: 1007 Ohm
Lead Channel Impedance Value: 1064 Ohm
Lead Channel Impedance Value: 1102 Ohm
Lead Channel Impedance Value: 1102 Ohm
Lead Channel Impedance Value: 1140 Ohm
Lead Channel Impedance Value: 304 Ohm
Lead Channel Impedance Value: 361 Ohm
Lead Channel Impedance Value: 361 Ohm
Lead Channel Impedance Value: 399 Ohm
Lead Channel Impedance Value: 988 Ohm
Lead Channel Pacing Threshold Amplitude: 1 V
Lead Channel Pacing Threshold Amplitude: 1 V
Lead Channel Pacing Threshold Amplitude: 1 V
Lead Channel Pacing Threshold Pulse Width: 0.4 ms
Lead Channel Pacing Threshold Pulse Width: 0.4 ms
Lead Channel Pacing Threshold Pulse Width: 1 ms
Lead Channel Sensing Intrinsic Amplitude: 2.5 mV
Lead Channel Setting Pacing Amplitude: 2 V
Lead Channel Setting Pacing Amplitude: 2 V
Lead Channel Setting Pacing Amplitude: 2.5 V
Lead Channel Setting Pacing Pulse Width: 0.4 ms
Lead Channel Setting Pacing Pulse Width: 1 ms
Lead Channel Setting Sensing Sensitivity: 4 mV

## 2019-11-17 ENCOUNTER — Ambulatory Visit (INDEPENDENT_AMBULATORY_CARE_PROVIDER_SITE_OTHER): Payer: Medicare Other | Admitting: *Deleted

## 2019-11-17 DIAGNOSIS — I442 Atrioventricular block, complete: Secondary | ICD-10-CM

## 2019-11-17 LAB — CUP PACEART REMOTE DEVICE CHECK
Battery Remaining Longevity: 104 mo
Battery Voltage: 3.08 V
Brady Statistic AP VP Percent: 60.23 %
Brady Statistic AP VS Percent: 0.04 %
Brady Statistic AS VP Percent: 38.58 %
Brady Statistic AS VS Percent: 1.13 %
Brady Statistic RA Percent Paced: 56.35 %
Brady Statistic RV Percent Paced: 98.78 %
Date Time Interrogation Session: 20210127015312
Implantable Lead Implant Date: 20130416
Implantable Lead Implant Date: 20130416
Implantable Lead Implant Date: 20200826
Implantable Lead Location: 753858
Implantable Lead Location: 753859
Implantable Lead Location: 753860
Implantable Lead Model: 5076
Implantable Lead Model: 5076
Implantable Pulse Generator Implant Date: 20200826
Lead Channel Impedance Value: 1045 Ohm
Lead Channel Impedance Value: 1083 Ohm
Lead Channel Impedance Value: 1121 Ohm
Lead Channel Impedance Value: 1159 Ohm
Lead Channel Impedance Value: 1197 Ohm
Lead Channel Impedance Value: 1216 Ohm
Lead Channel Impedance Value: 323 Ohm
Lead Channel Impedance Value: 342 Ohm
Lead Channel Impedance Value: 380 Ohm
Lead Channel Impedance Value: 399 Ohm
Lead Channel Impedance Value: 570 Ohm
Lead Channel Impedance Value: 608 Ohm
Lead Channel Impedance Value: 684 Ohm
Lead Channel Impedance Value: 703 Ohm
Lead Channel Pacing Threshold Amplitude: 0.75 V
Lead Channel Pacing Threshold Amplitude: 1 V
Lead Channel Pacing Threshold Amplitude: 1.125 V
Lead Channel Pacing Threshold Pulse Width: 0.4 ms
Lead Channel Pacing Threshold Pulse Width: 0.4 ms
Lead Channel Pacing Threshold Pulse Width: 1 ms
Lead Channel Sensing Intrinsic Amplitude: 1.75 mV
Lead Channel Sensing Intrinsic Amplitude: 1.75 mV
Lead Channel Setting Pacing Amplitude: 2 V
Lead Channel Setting Pacing Amplitude: 2.25 V
Lead Channel Setting Pacing Amplitude: 2.5 V
Lead Channel Setting Pacing Pulse Width: 0.4 ms
Lead Channel Setting Pacing Pulse Width: 1 ms
Lead Channel Setting Sensing Sensitivity: 4 mV

## 2019-12-17 ENCOUNTER — Ambulatory Visit: Payer: Medicare Other | Attending: Internal Medicine

## 2019-12-17 DIAGNOSIS — Z23 Encounter for immunization: Secondary | ICD-10-CM | POA: Insufficient documentation

## 2019-12-17 NOTE — Progress Notes (Signed)
   [id number]  Name:  William Woods.    MRN: [id number] DOB: Jul 18, 1946  12/17/2019  [name] was observed post Covid-19 immunization for 15 minutes without incidence. He was provided with Vaccine Information Sheet and instruction to access the V-Safe system.   [name] was instructed to call 911 with any severe reactions post vaccine: Marland Kitchen Difficulty breathing  . Swelling of your face and throat  . A fast heartbeat  . A bad rash all over your body  . Dizziness and weakness    Immunizations Administered    Name Date Dose VIS Date Route   Pfizer COVID-19 Vaccine 12/17/2019 10:22 AM 0.3 mL 10/01/2019 Intramuscular   Manufacturer: [place]   Lot: [id number]   [place]: [id number]

## 2019-12-21 ENCOUNTER — Ambulatory Visit (INDEPENDENT_AMBULATORY_CARE_PROVIDER_SITE_OTHER): Payer: Medicare Other | Admitting: Internal Medicine

## 2019-12-21 ENCOUNTER — Ambulatory Visit (INDEPENDENT_AMBULATORY_CARE_PROVIDER_SITE_OTHER): Payer: Medicare Other

## 2019-12-21 ENCOUNTER — Encounter: Payer: Self-pay | Admitting: Internal Medicine

## 2019-12-21 ENCOUNTER — Other Ambulatory Visit: Payer: Self-pay

## 2019-12-21 VITALS — BP 128/78 | HR 65 | Ht 72.0 in | Wt 300.0 lb

## 2019-12-21 DIAGNOSIS — I5042 Chronic combined systolic (congestive) and diastolic (congestive) heart failure: Secondary | ICD-10-CM | POA: Diagnosis not present

## 2019-12-21 DIAGNOSIS — I429 Cardiomyopathy, unspecified: Secondary | ICD-10-CM

## 2019-12-21 DIAGNOSIS — Z79899 Other long term (current) drug therapy: Secondary | ICD-10-CM | POA: Diagnosis not present

## 2019-12-21 DIAGNOSIS — I480 Paroxysmal atrial fibrillation: Secondary | ICD-10-CM | POA: Diagnosis not present

## 2019-12-21 DIAGNOSIS — I442 Atrioventricular block, complete: Secondary | ICD-10-CM | POA: Diagnosis not present

## 2019-12-21 DIAGNOSIS — Z950 Presence of cardiac pacemaker: Secondary | ICD-10-CM

## 2019-12-21 MED ORDER — FUROSEMIDE 40 MG PO TABS: 40.0000 mg | ORAL_TABLET | Freq: Every day | ORAL | 3 refills | Status: DC

## 2019-12-21 MED ORDER — METOPROLOL SUCCINATE ER 25 MG PO TB24: 25.0000 mg | ORAL_TABLET | Freq: Every day | ORAL | 3 refills | Status: DC

## 2019-12-21 MED ORDER — POTASSIUM CHLORIDE ER 10 MEQ PO TBCR: 10.0000 meq | EXTENDED_RELEASE_TABLET | Freq: Every day | ORAL | 3 refills | Status: DC

## 2019-12-21 MED ORDER — PERFLUTREN LIPID MICROSPHERE
1.0000 mL | INTRAVENOUS | Status: AC | PRN
  Administered 2019-12-21: 2 mL via INTRAVENOUS

## 2019-12-21 MED ORDER — LOSARTAN POTASSIUM 25 MG PO TABS: 25.0000 mg | ORAL_TABLET | Freq: Every day | ORAL | 3 refills | Status: DC

## 2019-12-21 MED ORDER — APIXABAN 5 MG PO TABS: 5.0000 mg | ORAL_TABLET | Freq: Two times a day (BID) | ORAL | 3 refills | Status: DC

## 2019-12-21 NOTE — Progress Notes (Signed)
Patient Care Team: [name], DO as PCP - General (Family Medicine)   HPI  William Woods. is a 74 y.o. male Seen for chief complaint for pacemaker implanted for symptomatic intermittent complete heart block 2013.  History of syncope with a prolonged QT interval and he had undergone loop recorder insertion. With recurrent syncope heart block was identified.   Treated with beta blockers for hypertension and long QT,  stopped this and his now on losartan   Diagnosed with Sleep apnea and has been improved with CPAP   BP has been much better since the control of his sleep apnea     Saw Pulm 7/20 with complaints of new onset (4-11m) SOB  Eval > PFTs and echo  As Below  Underwent CRT upgrade 8/20  Continues to feel much improved.  Not weak.  Some edema.  Denies orthopnea or nocturnal dyspnea.  No chest pain.  Fluid intake is exuberant at 1 gallon plus a day.  Does not like to take diuretics.     DATE TEST EF   2006 LHC 60-65 %   7/20 Echo   20-25 %   2/21         Date Cr K Hgb  12/18 1.13 4.3 14.5   2/20 1.14 4.3 14.1  8/20 1.13 4.2 14.6     He has  told me of the death of his two sons, one from cancer and one from cocaine overdose  And Gods calling him back via [employer] so [employer] that he Gave Works now with  [employer]    Past Medical History:  Diagnosis Date  . CHB (complete heart block) ([place])   . Edema    Suspect diastolic heart failure  . Hypertension   . Long Q-T syndrome   . Obesity (BMI 30-39.9)   . Obstructive sleep apnea   . Pacemaker    Medtronic Adapta L model ADDRL 1 (serial number [id number]) pacemaker.  . Syncope   . Thyroid disease     Past Surgical History:  Procedure Laterality Date  . BIV UPGRADE N/A 06/16/2019   Procedure: BIVP UPGRADE - BIVPacemake Upgrade;  Surgeon: Deboraha Sprang, MD;  Location: [place] [facility];  Service: Cardiovascular;  Laterality: N/A;  . CARDIAC CATHETERIZATION     [facility]   .  CARDIAC CATHETERIZATION  01/2012   [facility]: no significant CAD, EF 40%.   Marland Kitchen LOOP RECORDER EXPLANT N/A 02/04/2012   Procedure: LOOP RECORDER EXPLANT;  Surgeon: Thompson Grayer, MD;  Location: [facility] CATH LAB;  Service: Cardiovascular;  Laterality: N/A;  . LOOP RECORDER IMPLANT N/A 01/21/2012   Procedure: LOOP RECORDER IMPLANT;  Surgeon: Deboraha Sprang, MD;  Location: [facility] CATH LAB;  Service: Cardiovascular;  Laterality: N/A;  . OTHER SURGICAL HISTORY  02/04/2012    removal of a previously implanted MDT Reveal XT implantable loop recorder  . PACEMAKER INSERTION  02/04/2012   Medtronic Adapta L model ADDRL 1 (serial number [id number]) pacemaker.  Marland Kitchen PERMANENT PACEMAKER INSERTION N/A 02/04/2012   Procedure: PERMANENT PACEMAKER INSERTION;  Surgeon: Thompson Grayer, MD;  Location: [facility] CATH LAB;  Service: Cardiovascular;  Laterality: N/A;    Current Outpatient Medications  Medication Sig Dispense Refill  . apixaban (ELIQUIS) 5 MG TABS tablet Take 1 tablet (5 mg total) by mouth 2 (two) times daily. 60 tablet 6  . Ascorbic Acid (VITAMIN C) 1000 MG tablet Take 1,000 mg by mouth 2 (two) times daily.    Marland Kitchen  furosemide (LASIX) 40 MG tablet Take 1 tablet (40 mg total) by mouth daily. (Patient taking differently: Take 40 mg by mouth See admin instructions. Take 1 tablet (40 mg) by mouth twice daily (morning & lunch) x 5 days, ending on 06/16/2019--then resume 1 tablet (40 mg) by mouth daily in the morning) 90 tablet 3  . losartan (COZAAR) 25 MG tablet Take 1 tablet (25 mg total) by mouth daily. 90 tablet 3  . metoprolol succinate (TOPROL-XL) 25 MG 24 hr tablet Take 1 tablet (25 mg total) by mouth daily. 90 tablet 3  . potassium chloride (K-DUR) 10 MEQ tablet Take 1 tablet (10 mEq total) by mouth daily. 90 tablet 3   Current Facility-Administered Medications  Medication Dose Route Frequency Provider Last Rate Last Admin  . perflutren lipid microspheres (DEFINITY) IV suspension  1-10 mL Intravenous PRN Deboraha Sprang, MD   2 mL at  12/21/19 1015    Allergies  Allergen Reactions  . Other     LONG QT SYNDROME--MANY DRUGS PATIENT HAS TO AVOID SEE WEBSITE FOR DETAILS Http://[email or web address].org.uk/drugs_to_avoid.htm     Review of Systems negative except from HPI and PMH  Physical Exam BP 128/78 (BP Location: Left Arm, Patient Position: Sitting, Cuff Size: Large)   Pulse 65   Ht 6' (1.829 m)   Wt 300 lb (136.1 kg)   SpO2 97%   BMI 40.69 kg/m  Well developed and well nourished in no acute distress HENT normal Neck supple with JVP<10 Clear Device pocket well healed; without hematoma or erythema.  There is no tethering  Regular rate and rhythm, no  gallop No  murmur Abd-soft with active BS No Clubbing cyanosis 2+ edema Skin-warm and dry A & Oriented  Grossly normal sensory and motor function  ECG AV pacing with a negative QRS lead V1 and qr lead I    Assessment and  Plan  Complete Heart block    Cardiomyopathy-new  Congestive heart failure-chronic-/acute--systolic/diastolic  Pacemaker--CRT-D Medtronic  The patient's device was interrogated and the information was fully reviewed.  The device was reprogrammed to maximize longevity and to decrease diaphragmatic stimulation  OSA      HTN  Atrial fibrillation-paroxysmal  No intercurrent atrial fibrillation or flutter  No intercurrent Ventricular tachycardia  Fluid overloaded--encouraged him daily increase p.o. fluid intake to about 3 L a day (from 4) and also to take an extra diuretic 40 mg on days that his schedule permits.  Target weight would be about 285 pounds.  Await his echo report.  Preliminary showed persistent LV dysfunction.  If confirmed, we will begin him on Aldactone which would inform the need for potassium supplementation with his loop diuretic.  The addition of Delene Loll will be secondary could also consider SGLT2 inhibitor

## 2019-12-21 NOTE — Patient Instructions (Signed)
Medication Instructions:  - Your physician has recommended you make the following change in your medication:   1) You may take an extra lasix (furosemide) 40 mg twice a week as needed for increased swelling/ shortness of breath  2) if you are taking an extra lasix, then you will need to take an extra potassium 10 meq with this  *If you need a refill on your cardiac medications before your next appointment, please call your pharmacy*   Lab Work: - Your physician recommends that you have lab work today: BMP/ CBC  If you have labs (blood work) drawn today and your tests are completely normal, you will receive your results only by: Marland Kitchen MyChart Message (if you have MyChart) OR . A paper copy in the mail If you have any lab test that is abnormal or we need to change your treatment, we will call you to review the results.   Testing/Procedures: - none ordered   Follow-Up: At [facility], you and your health needs are our priority.  As part of our continuing mission to provide you with exceptional heart care, we have created designated Provider Care Teams.  These Care Teams include your primary Cardiologist (physician) and Advanced Practice Providers (APPs -  Physician Assistants and Nurse Practitioners) who all work together to provide you with the care you need, when you need it.  We recommend signing up for the patient portal called "MyChart".  Sign up information is provided on this After Visit Summary.  MyChart is used to connect with patients for Virtual Visits (Telemedicine).  Patients are able to view lab/test results, encounter notes, upcoming appointments, etc.  Non-urgent messages can be sent to your provider as well.   To learn more about what you can do with MyChart, go to [email or web address].    Your next appointment:   1 year(s)  The format for your next appointment:   In Person  Provider:   Virl Axe, MD   Other Instructions n/a

## 2019-12-22 LAB — CBC WITH DIFFERENTIAL/PLATELET
Basophils Absolute: 0 10*3/uL (ref 0.0–0.2)
Basos: 0 %
EOS (ABSOLUTE): 0.2 10*3/uL (ref 0.0–0.4)
Eos: 3 %
Hematocrit: 46.5 % (ref 37.5–51.0)
Hemoglobin: 15.4 g/dL (ref 13.0–17.7)
Immature Grans (Abs): 0 10*3/uL (ref 0.0–0.1)
Immature Granulocytes: 0 %
Lymphocytes Absolute: 1.5 10*3/uL (ref 0.7–3.1)
Lymphs: 22 %
MCH: 27.1 pg (ref 26.6–33.0)
MCHC: 33.1 g/dL (ref 31.5–35.7)
MCV: 82 fL (ref 79–97)
Monocytes Absolute: 0.6 10*3/uL (ref 0.1–0.9)
Monocytes: 9 %
Neutrophils Absolute: 4.7 10*3/uL (ref 1.4–7.0)
Neutrophils: 66 %
Platelets: 128 10*3/uL — ABNORMAL LOW (ref 150–450)
RBC: 5.69 x10E6/uL (ref 4.14–5.80)
RDW: 12.7 % (ref 11.6–15.4)
WBC: 7.1 10*3/uL (ref 3.4–10.8)

## 2019-12-22 LAB — BASIC METABOLIC PANEL
BUN/Creatinine Ratio: 13 (ref 10–24)
BUN: 15 mg/dL (ref 8–27)
CO2: 26 mmol/L (ref 20–29)
Calcium: 9.1 mg/dL (ref 8.6–10.2)
Chloride: 102 mmol/L (ref 96–106)
Creatinine, Ser: 1.14 mg/dL (ref 0.76–1.27)
GFR calc Af Amer: 74 mL/min/{1.73_m2} (ref >59)
GFR calc non Af Amer: 64 mL/min/{1.73_m2} (ref >59)
Glucose: 98 mg/dL (ref 65–99)
Potassium: 4.4 mmol/L (ref 3.5–5.2)
Sodium: 143 mmol/L (ref 134–144)

## 2019-12-24 ENCOUNTER — Telehealth: Payer: Medicare Other | Admitting: Nurse Practitioner

## 2019-12-24 ENCOUNTER — Telehealth: Payer: Self-pay | Admitting: Internal Medicine

## 2019-12-24 DIAGNOSIS — I5042 Chronic combined systolic (congestive) and diastolic (congestive) heart failure: Secondary | ICD-10-CM

## 2019-12-24 DIAGNOSIS — M1000 Idiopathic gout, unspecified site: Secondary | ICD-10-CM

## 2019-12-24 DIAGNOSIS — Z79899 Other long term (current) drug therapy: Secondary | ICD-10-CM

## 2019-12-24 MED ORDER — COLCHICINE 0.6 MG PO TABS: ORAL_TABLET | ORAL | 0 refills | Status: DC

## 2019-12-24 MED ORDER — SPIRONOLACTONE 25 MG PO TABS: ORAL_TABLET | ORAL | 1 refills | Status: DC

## 2019-12-24 NOTE — Progress Notes (Signed)
We are sorry that you are not feeling well. We are here to help!  Based on what you shared with me it looks like you have a flare of your gout.  Gout is a form of arthritis. It can cause pain and swelling in the joints. At first, it tends to affect only 1 joint - most frequently the big toe. It happens in people who have too much uric acid in the blood. Uric acid is a chemical that is produced when the body breaks down certain foods. Uric acid can form sharp needle-like crystals that build up in the joints and cause pain. Uric acid crystals can also form inside the tubes that carry urine from the kidneys to the bladder. These crystals can turn into "kidney stones" that can cause pain and problems with the flow of urine. People with gout get sudden "flares" or attacks of severe pain, most often the big toe, ankle, or knee. Often the joint also turns red and swells. Usually, only 1 joint is affected, but some people have pain in more than 1 joint. Gout flares tend to happen more often during the night.  The pain from gout can be extreme. The pain and swelling are worst at the beginning of a gout flare. The symptoms then get better within a few days to weeks. It is not clear how the body "turns off" a gout flare.  Do not start any NEW preventative medicine until the gout has cleared completely. However, If you are already on Probenecid or Allopurinol for CHRONIC gout, you may continue taking this during an active flare up  I have prescribed Colchicine 0.6 mg tabs - Take 2 tabs immediately, then 1 tab twice per day for the duration of the flare up to a max of 7 days (but discontinue for stomach pains or diarrhea)   For pain, I would recommend taking Tylenol ES 500mg  for pain.  If this is not effective, you should contact his primary care provider.    HOME CARE Losing weight can help relieve gout. It's not clear that following a specific diet plan will help with gout symptoms but eating a balanced diet can  help improve your overall health. It can also help you lose weight, if you are overweight. In general, a healthy diet includes plenty of fruits, vegetables, whole grains, and low-fat dairy products (labelled "low fat", skim, 2%). Avoid sugar sweetened drinks (including sodas, tea, juice and juice blends, coffee drinks and sports drinks) Limit alcohol to 1-2 drinks of beer, spirits or wine daily these can make gout flares worse. Some people with gout also have other health problems, such as heart disease, high blood pressure, kidney disease, or obesity. If you have any of these issues, it's important to work with your doctor to manage them. This can help improve your overall health and might also help with your gout.  GET HELP RIGHT AWAY IF: . Your symptoms persist after you have completed your treatment plan . You develop severe diarrhea . You develop abnormal sensations .  You develop vomiting,  .  You develop weakness .  You develop abdominal pain  FOLLOW UP WITH YOUR PRIMARY PROVIDER IF: . If your symptoms do not improve within 10 days  MAKE SURE YOU   Understand these instructions.  Will watch your condition.  Will get help right away if you are not doing well or get worse.  Your e-visit answers were reviewed by a board certified advanced clinical practitioner to complete  your personal care plan. Depending upon the condition, your plan could have included both over the counter or prescription medications.  Your safety is important to [place]. If you have drug allergies check your prescription carefully.   You can use MyChart to ask questions about today's visit, request a non-urgent call back, or ask for a work or school excuse for 24 hours related to this e-Visit. If it has been greater than 24 hours you will need to follow up with your provider, or enter a new e-Visit to address those concerns. You will get an e-mail with a link to a survey asking about your experience.  We hope that your  e-visit has been valuable and will speed your recovery! Thank you for using e-visits.  I have spent at least 5 minutes reviewing and documenting in the patient's chart.

## 2019-12-24 NOTE — Telephone Encounter (Signed)
I spoke with Mrs. Rudell regarding the patient's echo results. The patient is currently still in bed dealing with a gout flare up in his knee.  His PCP has prescribed colchicine for him.   Mrs. Dilullo is aware of Dr. Olin Pia recommendations to: 1) Start spironolactone (will need to clarify dose with MD)  2) Stop potassium  3) I have recommended a follow up BMP in 3 weeks - [facility] (7:30 am- 5:30 pm): Monday-Friday  The patient's wife voices understanding of the above and is agreeable.   Message received back from Dr. Caryl Comes that the patient should start on spironolactone 12.5 mg once daily.

## 2019-12-24 NOTE — Telephone Encounter (Signed)
William Sprang, MD  12/23/2019 8:59 PM EST    Please Inform Patient Echo showed abnormal and stable heart muscle function with EF 25-30%   Lets have him stop his KCl and place on spironolactone  He was on it previously but now tell him that it is for his heart muscle weakness  Nira Conn Thanks SK

## 2019-12-30 LAB — CUP PACEART INCLINIC DEVICE CHECK
Battery Remaining Longevity: 106 mo
Battery Voltage: 3.05 V
Brady Statistic AP VP Percent: 57.82 %
Brady Statistic AP VS Percent: 0.04 %
Brady Statistic AS VP Percent: 41.17 %
Brady Statistic AS VS Percent: 0.96 %
Brady Statistic RA Percent Paced: 54.46 %
Brady Statistic RV Percent Paced: 98.98 %
Date Time Interrogation Session: 20210302121800
Implantable Lead Implant Date: 20130416
Implantable Lead Implant Date: 20130416
Implantable Lead Implant Date: 20200826
Implantable Lead Location: 753858
Implantable Lead Location: 753859
Implantable Lead Location: 753860
Implantable Lead Model: 5076
Implantable Lead Model: 5076
Implantable Pulse Generator Implant Date: 20200826
Lead Channel Impedance Value: 1064 Ohm
Lead Channel Impedance Value: 1083 Ohm
Lead Channel Impedance Value: 1083 Ohm
Lead Channel Impedance Value: 1121 Ohm
Lead Channel Impedance Value: 1159 Ohm
Lead Channel Impedance Value: 1159 Ohm
Lead Channel Impedance Value: 304 Ohm
Lead Channel Impedance Value: 342 Ohm
Lead Channel Impedance Value: 361 Ohm
Lead Channel Impedance Value: 399 Ohm
Lead Channel Impedance Value: 570 Ohm
Lead Channel Impedance Value: 608 Ohm
Lead Channel Impedance Value: 627 Ohm
Lead Channel Impedance Value: 703 Ohm
Lead Channel Pacing Threshold Amplitude: 0.75 V
Lead Channel Pacing Threshold Amplitude: 0.75 V
Lead Channel Pacing Threshold Amplitude: 1 V
Lead Channel Pacing Threshold Pulse Width: 0.4 ms
Lead Channel Pacing Threshold Pulse Width: 0.4 ms
Lead Channel Pacing Threshold Pulse Width: 1 ms
Lead Channel Sensing Intrinsic Amplitude: 1.75 mV
Lead Channel Setting Pacing Amplitude: 1.75 V
Lead Channel Setting Pacing Amplitude: 2 V
Lead Channel Setting Pacing Amplitude: 2.5 V
Lead Channel Setting Pacing Pulse Width: 0.4 ms
Lead Channel Setting Pacing Pulse Width: 1 ms
Lead Channel Setting Sensing Sensitivity: 4 mV

## 2020-01-11 ENCOUNTER — Ambulatory Visit: Payer: Medicare Other | Attending: Internal Medicine

## 2020-01-11 DIAGNOSIS — Z23 Encounter for immunization: Secondary | ICD-10-CM

## 2020-01-11 NOTE — Progress Notes (Signed)
   [id number]  Name:  William Woods.    MRN: [id number] DOB: 14-Aug-1946  01/11/2020  William Woods was observed post Covid-19 immunization for 15 minutes without incident. He was provided with Vaccine Information Sheet and instruction to access the V-Safe system.   William Woods was instructed to call 911 with any severe reactions post vaccine: Marland Kitchen Difficulty breathing  . Swelling of face and throat  . A fast heartbeat  . A bad rash all over body  . Dizziness and weakness   Immunizations Administered    Name Date Dose VIS Date Route   Pfizer COVID-19 Vaccine 01/11/2020  3:28 PM 0.3 mL 10/01/2019 Intramuscular   Manufacturer: [place]   Lot: [id number]   [place]: [id number]

## 2020-01-18 ENCOUNTER — Other Ambulatory Visit
Admission: RE | Admit: 2020-01-18 | Discharge: 2020-01-18 | Disposition: A | Discharge status: Home / Self Care | Payer: Medicare Other | Source: Ambulatory Visit | Attending: Internal Medicine | Admitting: Internal Medicine

## 2020-01-18 DIAGNOSIS — I5042 Chronic combined systolic (congestive) and diastolic (congestive) heart failure: Secondary | ICD-10-CM

## 2020-01-18 DIAGNOSIS — Z79899 Other long term (current) drug therapy: Secondary | ICD-10-CM | POA: Diagnosis not present

## 2020-01-18 LAB — BASIC METABOLIC PANEL
Anion gap: 9 (ref 5–15)
BUN: 23 mg/dL (ref 8–23)
CO2: 28 mmol/L (ref 22–32)
Calcium: 9.1 mg/dL (ref 8.9–10.3)
Chloride: 102 mmol/L (ref 98–111)
Creatinine, Ser: 1.23 mg/dL (ref 0.61–1.24)
GFR calc Af Amer: >60 mL/min (ref >60)
GFR calc non Af Amer: 59 mL/min — ABNORMAL LOW (ref >60)
Glucose, Bld: 108 mg/dL — ABNORMAL HIGH (ref 70–99)
Potassium: 4 mmol/L (ref 3.5–5.1)
Sodium: 139 mmol/L (ref 135–145)

## 2020-02-16 ENCOUNTER — Ambulatory Visit (INDEPENDENT_AMBULATORY_CARE_PROVIDER_SITE_OTHER): Payer: Medicare Other | Admitting: *Deleted

## 2020-02-16 DIAGNOSIS — I442 Atrioventricular block, complete: Secondary | ICD-10-CM

## 2020-02-16 LAB — CUP PACEART REMOTE DEVICE CHECK
Battery Remaining Longevity: 103 mo
Battery Voltage: 3.03 V
Brady Statistic AP VP Percent: 59.39 %
Brady Statistic AP VS Percent: 0.03 %
Brady Statistic AS VP Percent: 39.83 %
Brady Statistic AS VS Percent: 0.74 %
Brady Statistic RA Percent Paced: 55.52 %
Brady Statistic RV Percent Paced: 99.25 %
Date Time Interrogation Session: 20210428002136
Implantable Lead Implant Date: 20130416
Implantable Lead Implant Date: 20130416
Implantable Lead Implant Date: 20200826
Implantable Lead Location: 753858
Implantable Lead Location: 753859
Implantable Lead Location: 753860
Implantable Lead Model: 5076
Implantable Lead Model: 5076
Implantable Pulse Generator Implant Date: 20200826
Lead Channel Impedance Value: 1102 Ohm
Lead Channel Impedance Value: 1102 Ohm
Lead Channel Impedance Value: 1121 Ohm
Lead Channel Impedance Value: 1159 Ohm
Lead Channel Impedance Value: 1197 Ohm
Lead Channel Impedance Value: 1235 Ohm
Lead Channel Impedance Value: 304 Ohm
Lead Channel Impedance Value: 342 Ohm
Lead Channel Impedance Value: 361 Ohm
Lead Channel Impedance Value: 380 Ohm
Lead Channel Impedance Value: 608 Ohm
Lead Channel Impedance Value: 608 Ohm
Lead Channel Impedance Value: 646 Ohm
Lead Channel Impedance Value: 760 Ohm
Lead Channel Pacing Threshold Amplitude: 0.875 V
Lead Channel Pacing Threshold Amplitude: 1 V
Lead Channel Pacing Threshold Amplitude: 1 V
Lead Channel Pacing Threshold Pulse Width: 0.4 ms
Lead Channel Pacing Threshold Pulse Width: 0.4 ms
Lead Channel Pacing Threshold Pulse Width: 1 ms
Lead Channel Sensing Intrinsic Amplitude: 2.875 mV
Lead Channel Sensing Intrinsic Amplitude: 2.875 mV
Lead Channel Setting Pacing Amplitude: 2 V
Lead Channel Setting Pacing Amplitude: 2 V
Lead Channel Setting Pacing Amplitude: 2.5 V
Lead Channel Setting Pacing Pulse Width: 0.4 ms
Lead Channel Setting Pacing Pulse Width: 1 ms
Lead Channel Setting Sensing Sensitivity: 4 mV

## 2020-02-17 NOTE — Progress Notes (Signed)
PPM Remote  

## 2020-02-21 DIAGNOSIS — D3616 Benign neoplasm of peripheral nerves and autonomic nervous system of pelvis: Secondary | ICD-10-CM | POA: Diagnosis not present

## 2020-02-21 DIAGNOSIS — R58 Hemorrhage, not elsewhere classified: Secondary | ICD-10-CM | POA: Diagnosis not present

## 2020-02-21 DIAGNOSIS — L821 Other seborrheic keratosis: Secondary | ICD-10-CM | POA: Diagnosis not present

## 2020-02-21 DIAGNOSIS — L820 Inflamed seborrheic keratosis: Secondary | ICD-10-CM | POA: Diagnosis not present

## 2020-05-17 ENCOUNTER — Ambulatory Visit (INDEPENDENT_AMBULATORY_CARE_PROVIDER_SITE_OTHER): Payer: Medicare Other | Admitting: *Deleted

## 2020-05-17 DIAGNOSIS — I442 Atrioventricular block, complete: Secondary | ICD-10-CM | POA: Diagnosis not present

## 2020-05-17 LAB — CUP PACEART REMOTE DEVICE CHECK
Battery Remaining Longevity: 103 mo
Battery Voltage: 3.01 V
Brady Statistic AP VP Percent: 52.41 %
Brady Statistic AP VS Percent: 0.02 %
Brady Statistic AS VP Percent: 47.17 %
Brady Statistic AS VS Percent: 0.35 %
Brady Statistic RA Percent Paced: 42.13 %
Brady Statistic RV Percent Paced: 99.66 %
Date Time Interrogation Session: 20210728012656
Implantable Lead Implant Date: 20130416
Implantable Lead Implant Date: 20130416
Implantable Lead Implant Date: 20200826
Implantable Lead Location: 753858
Implantable Lead Location: 753859
Implantable Lead Location: 753860
Implantable Lead Model: 5076
Implantable Lead Model: 5076
Implantable Pulse Generator Implant Date: 20200826
Lead Channel Impedance Value: 1216 Ohm
Lead Channel Impedance Value: 1254 Ohm
Lead Channel Impedance Value: 1273 Ohm
Lead Channel Impedance Value: 1330 Ohm
Lead Channel Impedance Value: 1387 Ohm
Lead Channel Impedance Value: 1387 Ohm
Lead Channel Impedance Value: 323 Ohm
Lead Channel Impedance Value: 361 Ohm
Lead Channel Impedance Value: 380 Ohm
Lead Channel Impedance Value: 399 Ohm
Lead Channel Impedance Value: 627 Ohm
Lead Channel Impedance Value: 703 Ohm
Lead Channel Impedance Value: 779 Ohm
Lead Channel Impedance Value: 817 Ohm
Lead Channel Pacing Threshold Amplitude: 0.875 V
Lead Channel Pacing Threshold Amplitude: 1 V
Lead Channel Pacing Threshold Amplitude: 1.125 V
Lead Channel Pacing Threshold Pulse Width: 0.4 ms
Lead Channel Pacing Threshold Pulse Width: 0.4 ms
Lead Channel Pacing Threshold Pulse Width: 1 ms
Lead Channel Sensing Intrinsic Amplitude: 3.5 mV
Lead Channel Sensing Intrinsic Amplitude: 3.5 mV
Lead Channel Setting Pacing Amplitude: 2 V
Lead Channel Setting Pacing Amplitude: 2 V
Lead Channel Setting Pacing Amplitude: 2.5 V
Lead Channel Setting Pacing Pulse Width: 0.4 ms
Lead Channel Setting Pacing Pulse Width: 1 ms
Lead Channel Setting Sensing Sensitivity: 4 mV

## 2020-05-18 ENCOUNTER — Telehealth: Payer: Self-pay | Admitting: Emergency Medicine

## 2020-05-18 ENCOUNTER — Other Ambulatory Visit: Payer: Self-pay | Admitting: *Deleted

## 2020-05-18 DIAGNOSIS — I480 Paroxysmal atrial fibrillation: Secondary | ICD-10-CM

## 2020-05-18 DIAGNOSIS — M1000 Idiopathic gout, unspecified site: Secondary | ICD-10-CM

## 2020-05-18 NOTE — Telephone Encounter (Signed)
My Chart message sent to the patient/ his wife with Dr. Olin Pia recommendations. See patient advise request.  Lab orders entered.

## 2020-05-18 NOTE — Telephone Encounter (Signed)
H   hope a good week We are off to [facility] on Sat-- supposed to be going from high 90s>.52s with some rian  Not sure why there is more afib Thoughts 1) is he using his CPAP and if so has the card been read recently 2) worth check hGB,TSH and BMET to make sure nothing amiss  In the absence of symptoms I would not be inclined to add medicine  And then we should see him [facility]  Thanks SK

## 2020-05-18 NOTE — Telephone Encounter (Signed)
(  1 day transmission received. Alert for AF burden increased from 8.7% in April  To 20.1% today. Patient reports he is asymptomatic. + Eliquis, no missed doses of Toprol XL 25 mg qd. He is not aware of when he is in AF. Patient will call the office if he has any change in condition.

## 2020-05-19 ENCOUNTER — Telehealth: Payer: Self-pay | Admitting: Pulmonary Disease

## 2020-05-19 NOTE — Progress Notes (Signed)
Remote pacemaker transmission.   

## 2020-05-19 NOTE — Telephone Encounter (Signed)
Patient requesting a copy of CPAP compliance report. Report mailed to patient's home address. This report requested by patient's cardiologist due to persistent afib.

## 2020-05-22 ENCOUNTER — Other Ambulatory Visit: Payer: Self-pay

## 2020-05-22 ENCOUNTER — Other Ambulatory Visit
Admission: RE | Admit: 2020-05-22 | Discharge: 2020-05-22 | Disposition: A | Discharge status: Home / Self Care | Payer: Medicare Other | Attending: Internal Medicine | Admitting: Internal Medicine

## 2020-05-22 DIAGNOSIS — I480 Paroxysmal atrial fibrillation: Secondary | ICD-10-CM | POA: Insufficient documentation

## 2020-05-22 LAB — CBC WITH DIFFERENTIAL/PLATELET
Abs Immature Granulocytes: 0.02 10*3/uL (ref 0.00–0.07)
Basophils Absolute: 0 10*3/uL (ref 0.0–0.1)
Basophils Relative: 0 %
Eosinophils Absolute: 0.1 10*3/uL (ref 0.0–0.5)
Eosinophils Relative: 1 %
HCT: 48.2 % (ref 39.0–52.0)
Hemoglobin: 15.3 g/dL (ref 13.0–17.0)
Immature Granulocytes: 0 %
Lymphocytes Relative: 17 %
Lymphs Abs: 1.6 10*3/uL (ref 0.7–4.0)
MCH: 26.8 pg (ref 26.0–34.0)
MCHC: 31.7 g/dL (ref 30.0–36.0)
MCV: 84.4 fL (ref 80.0–100.0)
Monocytes Absolute: 0.8 10*3/uL (ref 0.1–1.0)
Monocytes Relative: 9 %
Neutro Abs: 6.7 10*3/uL (ref 1.7–7.7)
Neutrophils Relative %: 73 %
Platelets: 154 10*3/uL (ref 150–400)
RBC: 5.71 MIL/uL (ref 4.22–5.81)
RDW: 13.8 % (ref 11.5–15.5)
WBC: 9.4 10*3/uL (ref 4.0–10.5)
nRBC: 0 % (ref 0.0–0.2)

## 2020-05-22 LAB — BASIC METABOLIC PANEL
Anion gap: 7 (ref 5–15)
BUN: 27 mg/dL — ABNORMAL HIGH (ref 8–23)
CO2: 27 mmol/L (ref 22–32)
Calcium: 9.1 mg/dL (ref 8.9–10.3)
Chloride: 103 mmol/L (ref 98–111)
Creatinine, Ser: 1.19 mg/dL (ref 0.61–1.24)
GFR calc Af Amer: >60 mL/min (ref >60)
GFR calc non Af Amer: >60 mL/min (ref >60)
Glucose, Bld: 114 mg/dL — ABNORMAL HIGH (ref 70–99)
Potassium: 4.1 mmol/L (ref 3.5–5.1)
Sodium: 137 mmol/L (ref 135–145)

## 2020-05-22 LAB — TSH: TSH: 2.479 u[IU]/mL (ref 0.350–4.500)

## 2020-05-25 MED ORDER — COLCHICINE 0.6 MG PO TABS: ORAL_TABLET | ORAL | 0 refills | Status: DC

## 2020-06-16 NOTE — Telephone Encounter (Signed)
H  I dont see it   maybe we can look at it on Tuesday Thanks SK

## 2020-06-19 ENCOUNTER — Other Ambulatory Visit: Payer: Self-pay | Admitting: Internal Medicine

## 2020-06-20 NOTE — Telephone Encounter (Signed)
This is a [place] pt 

## 2020-06-21 ENCOUNTER — Other Ambulatory Visit: Payer: Self-pay

## 2020-06-21 MED ORDER — SPIRONOLACTONE 25 MG PO TABS: ORAL_TABLET | ORAL | 1 refills | Status: DC

## 2020-06-27 ENCOUNTER — Other Ambulatory Visit: Payer: Self-pay | Admitting: Internal Medicine

## 2020-06-27 DIAGNOSIS — M1000 Idiopathic gout, unspecified site: Secondary | ICD-10-CM

## 2020-06-27 NOTE — Telephone Encounter (Signed)
Please advise if ok to refill non cardiac medication. Colchicine 0.6 mg tablet.

## 2020-07-04 ENCOUNTER — Other Ambulatory Visit: Payer: Self-pay

## 2020-07-04 ENCOUNTER — Encounter: Payer: Self-pay | Admitting: Pulmonary Disease

## 2020-07-04 ENCOUNTER — Ambulatory Visit (INDEPENDENT_AMBULATORY_CARE_PROVIDER_SITE_OTHER): Payer: Medicare Other | Admitting: Pulmonary Disease

## 2020-07-04 VITALS — BP 140/82 | HR 88 | Temp 97.1°F | Ht 72.0 in | Wt 287.0 lb

## 2020-07-04 DIAGNOSIS — Z0000 Encounter for general adult medical examination without abnormal findings: Secondary | ICD-10-CM | POA: Diagnosis not present

## 2020-07-04 DIAGNOSIS — Z6838 Body mass index (BMI) 38.0-38.9, adult: Secondary | ICD-10-CM | POA: Diagnosis not present

## 2020-07-04 DIAGNOSIS — G4733 Obstructive sleep apnea (adult) (pediatric): Secondary | ICD-10-CM | POA: Diagnosis not present

## 2020-07-04 NOTE — Progress Notes (Signed)
Reviewed and agree with assessment/plan.   Chesley Mires, MD [facility] Pulmonary/Critical Care 07/04/2020, 12:57 PM Pager:  [phone]

## 2020-07-04 NOTE — Addendum Note (Signed)
Addended by: Lia Foyer R on: 07/04/2020 12:35 PM   Modules accepted: Orders

## 2020-07-04 NOTE — Assessment & Plan Note (Signed)
Plan: Continue CPAP therapy Follow-up in 1 year New CPAP order placed today Continue to work on reducing your weight

## 2020-07-04 NOTE — Assessment & Plan Note (Signed)
Patient aggressively working on losing weight Goal is 100 pound weight loss Congratulated patient on his work so far  Plan: Keep up the hard work reducing your BMI

## 2020-07-04 NOTE — Patient Instructions (Addendum)
You were seen today by Lauraine Rinne, NP  for:   1. OSA (obstructive sleep apnea)  New CPAP ordered today Supplies Keep same pressure settings  We recommend that you continue using your CPAP daily >>>Keep up the hard work using your device >>> Goal should be wearing this for the entire night that you are sleeping, at least 4 to 6 hours  Remember:  . Do not drive or operate heavy machinery if tired or drowsy.  . Please notify the supply company and office if you are unable to use your device regularly due to missing supplies or machine being broken.  . Work on maintaining a healthy weight and following your recommended nutrition plan  . Maintain proper daily exercise and movement  . Maintaining proper use of your device can also help improve management of other chronic illnesses such as: Blood pressure, blood sugars, and weight management.   BiPAP/ CPAP Cleaning:  >>>Clean weekly, with Dawn soap, and bottle brush.  Set up to air dry. >>> Wipe mask out daily with wet wipe or towelette    2. BMI 38.0-38.9,adult  Continue to work diligently on reducing your BMI! Keep up the hard work!  3. Healthcare maintenance  We recommend the seasonal flu vaccine   Follow Up:    Return in about 1 year (around 07/04/2021), or if symptoms worsen or fail to improve, for [facility].   Notification of test results are managed in the following manner: If there are  any recommendations or changes to the  plan of care discussed in office today,  we will contact you and let you know what they are. If you do not hear from [place], then your results are normal and you can view them through your  MyChart account , or a letter will be sent to you. Thank you again for trusting [place] with your care  - Thank you, [facility]    It is flu season:   >>> Best ways to protect herself from the flu: Receive the yearly flu vaccine, practice good hand hygiene washing with soap and also using hand  sanitizer when available, eat a nutritious meals, get adequate rest, hydrate appropriately       Please contact the office if your symptoms worsen or you have concerns that you are not improving.   Thank you for choosing [facility] for your healthcare, and for allowing [place] to partner with you on your healthcare journey. I am thankful to be able to provide care to you today.   Wyn Quaker FNP-C

## 2020-07-04 NOTE — Progress Notes (Signed)
@Patient  ID: William Pica., male    DOB: [birth year], 74 y.o.   MRN: [id number]  Chief Complaint  Patient presents with  . Follow-up    Patient is feeling good overall would like new machine has had his for 8 years    Referring provider: Nobie Putnam *  HPI:  74 year old male former smoker followed in our office for obstructive sleep apnea and dyspnea on exertion  PMH: Hypertension, morbid obesity, dyslipidemia Smoker/ Smoking History: Former smoker.  Quit 1989.  100-pack-year smoking history Maintenance:   Pt of: Dr. Alva Garnet  07/04/2020  - Visit   74 year old male former smoker followed in our office for obstructive sleep apnea and dyspnea on exertion.  He is a former patient of Dr. Alva Garnet.  Patient was last seen in our office in July/2020.  At that point in time patient was reporting new worsened dyspnea on exertion.  Blood tests were ordered, chest x-ray was ordered as well as pulmonary function testing.  Patient was encouraged to remain on CPAP therapy.  Documentation in her chart shows that in August/2020 pulmonary function test was canceled by patient as shortness of breath was felt to be cardiac in nature.  Patient presenting to office today as a 1 year follow-up.  He is hoping to obtain a new CPAP machine as his current one is 72 years old.  We will discuss this today.  CPAP compliance shows excellent compliance see compliance were listed below:  06/03/2020-07/03/2019 21-30 had a last 30 days used, 30 those days greater than 4 hours, average usage 6 hours and 54 minutes, APAP pressure 5-20, 95th percentile 12.9, AHI 2.8  Patient has no acute respiratory complaints today or issues with his CPAP. Patient has received his COVID-19 vaccinations. He is not planning on receiving his seasonal flu vaccine.  Tests:   06/16/2019-chest x-ray-left-sided pacemaker is noted in grossly good position, no pneumothorax is noted  11/26/2012-sleep study-AHI 37.7  FENO:  No results  found for: NITRICOXIDE  PFT: No flowsheet data found.  WALK:  No flowsheet data found.  Imaging: No results found.  Lab Results:  CBC    Component Value Date/Time   WBC 9.4 05/22/2020 0935   RBC 5.71 05/22/2020 0935   HGB 15.3 05/22/2020 0935   HGB 15.4 12/21/2019 1211   HCT 48.2 05/22/2020 0935   HCT 46.5 12/21/2019 1211   PLT 154 05/22/2020 0935   PLT 128 (L) 12/21/2019 1211   MCV 84.4 05/22/2020 0935   MCV 82 12/21/2019 1211   MCV 81 01/20/2012 0311   MCH 26.8 05/22/2020 0935   MCHC 31.7 05/22/2020 0935   RDW 13.8 05/22/2020 0935   RDW 12.7 12/21/2019 1211   RDW 13.9 01/20/2012 0311   LYMPHSABS 1.6 05/22/2020 0935   LYMPHSABS 1.5 12/21/2019 1211   LYMPHSABS 1.9 01/20/2012 0311   MONOABS 0.8 05/22/2020 0935   MONOABS 0.6 01/20/2012 0311   EOSABS 0.1 05/22/2020 0935   EOSABS 0.2 12/21/2019 1211   EOSABS 0.1 01/20/2012 0311   BASOSABS 0.0 05/22/2020 0935   BASOSABS 0.0 12/21/2019 1211   BASOSABS 0.0 01/20/2012 0311    BMET    Component Value Date/Time   NA 137 05/22/2020 0935   NA 143 12/21/2019 1211   NA 143 01/20/2012 0311   K 4.1 05/22/2020 0935   K 4.1 01/20/2012 0311   CL 103 05/22/2020 0935   CL 107 01/20/2012 0311   CO2 27 05/22/2020 0935   CO2 30 01/20/2012 0311   GLUCOSE  114 (H) 05/22/2020 0935   GLUCOSE 103 (H) 01/20/2012 0311   BUN 27 (H) 05/22/2020 0935   BUN 15 12/21/2019 1211   BUN 21 (H) 01/20/2012 0311   CREATININE 1.19 05/22/2020 0935   CREATININE 1.13 09/23/2017 0935   CALCIUM 9.1 05/22/2020 0935   CALCIUM 8.5 01/20/2012 0311   GFRNONAA >60 05/22/2020 0935   GFRNONAA 66 09/23/2017 0935   GFRAA >60 05/22/2020 0935   GFRAA 76 09/23/2017 0935    BNP    Component Value Date/Time   BNP 374.0 (H) 04/22/2019 1307    ProBNP    Component Value Date/Time   PROBNP 743.6 (H) 02/04/2012 1632    Specialty Problems      Pulmonary Problems   OSA (obstructive sleep apnea)    HST 3 nights 09/2013:  AHI 26 to 55/hr.           Allergies  Allergen Reactions  . Other     LONG QT SYNDROME--MANY DRUGS PATIENT HAS TO AVOID SEE WEBSITE FOR DETAILS Http://www.sads.org.uk/drugs_to_avoid.htm     Immunization History  Administered Date(s) Administered  . PFIZER SARS-COV-2 Vaccination 12/17/2019, 01/11/2020    Past Medical History:  Diagnosis Date  . CHB (complete heart block) ([place])   . Edema    Suspect diastolic heart failure  . Hypertension   . Long Q-T syndrome   . Obesity (BMI 30-39.9)   . Obstructive sleep apnea   . Pacemaker    Medtronic Adapta L model ADDRL 1 (serial number [id number]) pacemaker.  . Syncope   . Thyroid disease     Tobacco History: Social History   Tobacco Use  Smoking Status Former Smoker  . Packs/day: 4.00  . Years: 25.00  . Pack years: 100.00  . Types: Cigarettes  . Quit date: 02/06/1988  . Years since quitting: 32.4  Smokeless Tobacco Former Air traffic controller given: Not Answered   Continue to not smoke  Outpatient Encounter Medications as of 07/04/2020  Medication Sig  . apixaban (ELIQUIS) 5 MG TABS tablet Take 1 tablet (5 mg total) by mouth 2 (two) times daily.  . Ascorbic Acid (VITAMIN C) 1000 MG tablet Take 1,000 mg by mouth 2 (two) times daily.  . colchicine 0.6 MG tablet Take 2 tabs immediately, then 1 tab twice per day for the duration of the flare up to a max of 7 days (but discontinue for stomach pains or diarrhea)  . furosemide (LASIX) 40 MG tablet Take 1 tablet (40 mg total) by mouth daily.  . metoprolol succinate (TOPROL-XL) 25 MG 24 hr tablet Take 1 tablet (25 mg total) by mouth daily.  Marland Kitchen spironolactone (ALDACTONE) 25 MG tablet Take 0.5 tablet (12.5 mg) by mouth once daily  . losartan (COZAAR) 25 MG tablet Take 1 tablet (25 mg total) by mouth daily. (Patient not taking: Reported on 07/04/2020)   No facility-administered encounter medications on file as of 07/04/2020.     Review of Systems  Review of Systems  Constitutional: Negative for activity  change, chills, fatigue, fever and unexpected weight change.  HENT: Negative for postnasal drip, rhinorrhea, sinus pressure, sinus pain and sore throat.   Eyes: Negative.   Respiratory: Negative for cough, shortness of breath and wheezing.   Cardiovascular: Negative for chest pain and palpitations.  Gastrointestinal: Negative for constipation, diarrhea, nausea and vomiting.  Endocrine: Negative.   Genitourinary: Negative.   Musculoskeletal: Negative.   Skin: Negative.   Neurological: Negative for dizziness and headaches.  Psychiatric/Behavioral: Negative.  Negative for dysphoric  mood. The patient is not nervous/anxious.   All other systems reviewed and are negative.    Physical Exam  BP 140/82 (BP Location: Left Arm, Patient Position: Sitting, Cuff Size: Large)   Pulse 88   Temp (!) 97.1 F (36.2 C) (Temporal)   Ht 6' (1.829 m)   Wt 287 lb (130.2 kg)   SpO2 95%   BMI 38.92 kg/m   Wt Readings from Last 5 Encounters:  07/04/20 287 lb (130.2 kg)  12/21/19 300 lb (136.1 kg)  09/21/19 (!) 300 lb 4 oz (136.2 kg)  06/16/19 282 lb (127.9 kg)  06/08/19 295 lb (133.8 kg)    BMI Readings from Last 5 Encounters:  07/04/20 38.92 kg/m  12/21/19 40.69 kg/m  09/21/19 40.72 kg/m  06/16/19 38.25 kg/m  06/08/19 40.01 kg/m     Physical Exam Vitals and nursing note reviewed.  Constitutional:      General: He is not in acute distress.    Appearance: Normal appearance. He is obese.  HENT:     Head: Normocephalic and atraumatic.     Right Ear: Hearing and external ear normal.     Left Ear: Hearing and external ear normal.     Nose: No mucosal edema.     Right Turbinates: Not enlarged.     Left Turbinates: Not enlarged.  Eyes:     Pupils: Pupils are equal, round, and reactive to light.  Cardiovascular:     Rate and Rhythm: Normal rate and regular rhythm.     Pulses: Normal pulses.     Heart sounds: Normal heart sounds. No murmur heard.   Pulmonary:     Effort: Pulmonary  effort is normal.     Breath sounds: Normal breath sounds. No decreased breath sounds, wheezing or rales.  Musculoskeletal:     Cervical back: Normal range of motion.     Right lower leg: No edema.     Left lower leg: No edema.  Lymphadenopathy:     Cervical: No cervical adenopathy.  Skin:    General: Skin is warm and dry.     Capillary Refill: Capillary refill takes less than 2 seconds.     Findings: No erythema or rash.  Neurological:     General: No focal deficit present.     Mental Status: He is alert and oriented to person, place, and time.     Motor: No weakness.     Coordination: Coordination normal.     Gait: Gait is intact. Gait normal.  Psychiatric:        Mood and Affect: Mood normal.        Behavior: Behavior normal. Behavior is cooperative.        Thought Content: Thought content normal.        Judgment: Judgment normal.       Assessment & Plan:   OSA (obstructive sleep apnea) Plan: Continue CPAP therapy Follow-up in 1 year New CPAP order placed today Continue to work on reducing your weight  BMI 38.0-38.9,adult Patient aggressively working on losing weight Goal is 100 pound weight loss Congratulated patient on his work so far  Plan: Keep up the hard work reducing your [place] maintenance Explained to patient he is high risk for complications from the flu. We would strongly recommend that he obtain the high-dose flu vaccine when available in fall/2021. I have encouraged patient to discuss this with primary care. He is aware that he can receive this from either primary care, our office or  a Runner, broadcasting/film/video.  Patient still declines the seasonal flu vaccine at this time.    Return in about 1 year (around 07/04/2021), or if symptoms worsen or fail to improve, for [facility]   Lauraine Rinne, NP 07/04/2020   This appointment required 23 minutes of patient care (this includes precharting, chart review, review of results,  face-to-face care, etc.).

## 2020-07-04 NOTE — Assessment & Plan Note (Signed)
Explained to patient he is high risk for complications from the flu. We would strongly recommend that he obtain the high-dose flu vaccine when available in fall/2021. I have encouraged patient to discuss this with primary care. He is aware that he can receive this from either primary care, our office or a Runner, broadcasting/film/video.  Patient still declines the seasonal flu vaccine at this time.

## 2020-07-11 ENCOUNTER — Telehealth: Payer: Self-pay | Admitting: Internal Medicine

## 2020-07-11 NOTE — Telephone Encounter (Signed)
Verbal message received from Dr. Caryl Comes this morning that he received a call from the patient's wife over the weekend stating the patient had fallen and had some bleeding/ bruising/ swelling to her leg. He advised that he told Mrs. Bless to ace wrap the patient's leg and we would check on him early this week.   Will reach out to the patient/ his wife to follow up.

## 2020-07-11 NOTE — Telephone Encounter (Signed)
Spoke with patient who states he is doing much better now and appreciates the call.

## 2020-07-12 NOTE — Telephone Encounter (Signed)
Ladies thx  Sk

## 2020-07-14 DIAGNOSIS — M79605 Pain in left leg: Secondary | ICD-10-CM | POA: Diagnosis not present

## 2020-08-16 ENCOUNTER — Ambulatory Visit (INDEPENDENT_AMBULATORY_CARE_PROVIDER_SITE_OTHER): Payer: Medicare Other

## 2020-08-16 DIAGNOSIS — I442 Atrioventricular block, complete: Secondary | ICD-10-CM | POA: Diagnosis not present

## 2020-08-21 ENCOUNTER — Other Ambulatory Visit: Payer: Self-pay | Admitting: Internal Medicine

## 2020-08-21 LAB — CUP PACEART REMOTE DEVICE CHECK
Battery Remaining Longevity: 100 mo
Battery Voltage: 3 V
Brady Statistic AP VP Percent: 43.06 %
Brady Statistic AP VS Percent: 0.03 %
Brady Statistic AS VP Percent: 54.78 %
Brady Statistic AS VS Percent: 2.07 %
Brady Statistic RA Percent Paced: 30.93 %
Brady Statistic RV Percent Paced: 98.28 %
Date Time Interrogation Session: 20211031151637
Implantable Lead Implant Date: 20130416
Implantable Lead Implant Date: 20130416
Implantable Lead Implant Date: 20200826
Implantable Lead Location: 753858
Implantable Lead Location: 753859
Implantable Lead Location: 753860
Implantable Lead Model: 5076
Implantable Lead Model: 5076
Implantable Pulse Generator Implant Date: 20200826
Lead Channel Impedance Value: 1216 Ohm
Lead Channel Impedance Value: 1235 Ohm
Lead Channel Impedance Value: 1273 Ohm
Lead Channel Impedance Value: 1311 Ohm
Lead Channel Impedance Value: 1349 Ohm
Lead Channel Impedance Value: 1387 Ohm
Lead Channel Impedance Value: 342 Ohm
Lead Channel Impedance Value: 361 Ohm
Lead Channel Impedance Value: 399 Ohm
Lead Channel Impedance Value: 399 Ohm
Lead Channel Impedance Value: 665 Ohm
Lead Channel Impedance Value: 722 Ohm
Lead Channel Impedance Value: 760 Ohm
Lead Channel Impedance Value: 817 Ohm
Lead Channel Pacing Threshold Amplitude: 0.875 V
Lead Channel Pacing Threshold Amplitude: 0.875 V
Lead Channel Pacing Threshold Amplitude: 1.5 V
Lead Channel Pacing Threshold Pulse Width: 0.4 ms
Lead Channel Pacing Threshold Pulse Width: 0.4 ms
Lead Channel Pacing Threshold Pulse Width: 1 ms
Lead Channel Sensing Intrinsic Amplitude: 3.25 mV
Lead Channel Sensing Intrinsic Amplitude: 3.25 mV
Lead Channel Setting Pacing Amplitude: 2 V
Lead Channel Setting Pacing Amplitude: 2 V
Lead Channel Setting Pacing Amplitude: 2.5 V
Lead Channel Setting Pacing Pulse Width: 0.4 ms
Lead Channel Setting Pacing Pulse Width: 1 ms
Lead Channel Setting Sensing Sensitivity: 4 mV

## 2020-08-21 NOTE — Telephone Encounter (Signed)
Eliquis 5mg  refill request received. Patient is 74 years old, weight-130.2kg, Crea-1.19 on 05/22/2020, Diagnosis-Afib, and last seen by Dr. Caryl Comes on 12/21/2019. Dose is appropriate based on dosing criteria. Will send in refill to requested pharmacy.

## 2020-08-22 NOTE — Progress Notes (Signed)
Remote pacemaker transmission.   

## 2020-10-31 DIAGNOSIS — L578 Other skin changes due to chronic exposure to nonionizing radiation: Secondary | ICD-10-CM | POA: Diagnosis not present

## 2020-10-31 DIAGNOSIS — D485 Neoplasm of uncertain behavior of skin: Secondary | ICD-10-CM | POA: Diagnosis not present

## 2020-10-31 DIAGNOSIS — D225 Melanocytic nevi of trunk: Secondary | ICD-10-CM | POA: Diagnosis not present

## 2020-10-31 DIAGNOSIS — L218 Other seborrheic dermatitis: Secondary | ICD-10-CM | POA: Diagnosis not present

## 2020-10-31 DIAGNOSIS — L918 Other hypertrophic disorders of the skin: Secondary | ICD-10-CM | POA: Diagnosis not present

## 2020-10-31 DIAGNOSIS — L821 Other seborrheic keratosis: Secondary | ICD-10-CM | POA: Diagnosis not present

## 2020-11-07 ENCOUNTER — Other Ambulatory Visit: Payer: Self-pay | Admitting: Internal Medicine

## 2020-11-07 ENCOUNTER — Other Ambulatory Visit: Payer: Self-pay

## 2020-11-07 MED ORDER — SPIRONOLACTONE 25 MG PO TABS: 12.5000 mg | ORAL_TABLET | Freq: Every day | ORAL | 0 refills | Status: DC

## 2020-11-15 ENCOUNTER — Ambulatory Visit (INDEPENDENT_AMBULATORY_CARE_PROVIDER_SITE_OTHER): Payer: Medicare Other

## 2020-11-15 DIAGNOSIS — I442 Atrioventricular block, complete: Secondary | ICD-10-CM

## 2020-11-15 LAB — CUP PACEART REMOTE DEVICE CHECK
Battery Remaining Longevity: 97 mo
Battery Voltage: 3 V
Brady Statistic AP VP Percent: 52.55 %
Brady Statistic AP VS Percent: 0.03 %
Brady Statistic AS VP Percent: 45.55 %
Brady Statistic AS VS Percent: 1.84 %
Brady Statistic RA Percent Paced: 43.03 %
Brady Statistic RV Percent Paced: 98.32 %
Date Time Interrogation Session: 20220125205852
Implantable Lead Implant Date: 20130416
Implantable Lead Implant Date: 20130416
Implantable Lead Implant Date: 20200826
Implantable Lead Location: 753858
Implantable Lead Location: 753859
Implantable Lead Location: 753860
Implantable Lead Model: 5076
Implantable Lead Model: 5076
Implantable Pulse Generator Implant Date: 20200826
Lead Channel Impedance Value: 1197 Ohm
Lead Channel Impedance Value: 1216 Ohm
Lead Channel Impedance Value: 1254 Ohm
Lead Channel Impedance Value: 1330 Ohm
Lead Channel Impedance Value: 1330 Ohm
Lead Channel Impedance Value: 1387 Ohm
Lead Channel Impedance Value: 323 Ohm
Lead Channel Impedance Value: 361 Ohm
Lead Channel Impedance Value: 380 Ohm
Lead Channel Impedance Value: 399 Ohm
Lead Channel Impedance Value: 646 Ohm
Lead Channel Impedance Value: 741 Ohm
Lead Channel Impedance Value: 760 Ohm
Lead Channel Impedance Value: 798 Ohm
Lead Channel Pacing Threshold Amplitude: 0.625 V
Lead Channel Pacing Threshold Amplitude: 1 V
Lead Channel Pacing Threshold Amplitude: 1.375 V
Lead Channel Pacing Threshold Pulse Width: 0.4 ms
Lead Channel Pacing Threshold Pulse Width: 0.4 ms
Lead Channel Pacing Threshold Pulse Width: 1 ms
Lead Channel Sensing Intrinsic Amplitude: 2.625 mV
Lead Channel Sensing Intrinsic Amplitude: 2.625 mV
Lead Channel Setting Pacing Amplitude: 2 V
Lead Channel Setting Pacing Amplitude: 2 V
Lead Channel Setting Pacing Amplitude: 2.5 V
Lead Channel Setting Pacing Pulse Width: 0.4 ms
Lead Channel Setting Pacing Pulse Width: 1 ms
Lead Channel Setting Sensing Sensitivity: 4 mV

## 2020-11-27 NOTE — Progress Notes (Signed)
Remote pacemaker transmission.   

## 2020-12-20 DIAGNOSIS — M9903 Segmental and somatic dysfunction of lumbar region: Secondary | ICD-10-CM | POA: Diagnosis not present

## 2020-12-20 DIAGNOSIS — M4607 Spinal enthesopathy, lumbosacral region: Secondary | ICD-10-CM | POA: Diagnosis not present

## 2020-12-20 DIAGNOSIS — M4608 Spinal enthesopathy, sacral and sacrococcygeal region: Secondary | ICD-10-CM | POA: Diagnosis not present

## 2020-12-20 DIAGNOSIS — M5127 Other intervertebral disc displacement, lumbosacral region: Secondary | ICD-10-CM | POA: Diagnosis not present

## 2020-12-26 ENCOUNTER — Ambulatory Visit (INDEPENDENT_AMBULATORY_CARE_PROVIDER_SITE_OTHER): Payer: Medicare Other | Admitting: Internal Medicine

## 2020-12-26 ENCOUNTER — Other Ambulatory Visit: Payer: Self-pay

## 2020-12-26 ENCOUNTER — Encounter: Payer: Self-pay | Admitting: Internal Medicine

## 2020-12-26 VITALS — BP 140/78 | HR 62 | Ht 72.0 in | Wt 295.0 lb

## 2020-12-26 DIAGNOSIS — I5042 Chronic combined systolic (congestive) and diastolic (congestive) heart failure: Secondary | ICD-10-CM

## 2020-12-26 DIAGNOSIS — Z79899 Other long term (current) drug therapy: Secondary | ICD-10-CM

## 2020-12-26 DIAGNOSIS — Z950 Presence of cardiac pacemaker: Secondary | ICD-10-CM | POA: Diagnosis not present

## 2020-12-26 DIAGNOSIS — I429 Cardiomyopathy, unspecified: Secondary | ICD-10-CM

## 2020-12-26 DIAGNOSIS — I10 Essential (primary) hypertension: Secondary | ICD-10-CM | POA: Diagnosis not present

## 2020-12-26 DIAGNOSIS — I480 Paroxysmal atrial fibrillation: Secondary | ICD-10-CM | POA: Diagnosis not present

## 2020-12-26 DIAGNOSIS — I442 Atrioventricular block, complete: Secondary | ICD-10-CM

## 2020-12-26 NOTE — Progress Notes (Signed)
Patient Care Team: [name], DO as PCP - General (Family Medicine)   HPI  William Woods. is a 75 y.o. male Seen for chief complaint for pacemaker implanted for symptomatic intermittent complete heart block 2013.  History of syncope with a prolonged QT interval and he had undergone loop recorder insertion. With recurrent syncope heart block was identified.   Treated with beta blockers for hypertension and long QT,  stopped this and his now on losartan   Diagnosed with Sleep apnea and has been improved with CPAP   BP has been much better since the control of his sleep apnea     Saw Pulm 7/20 with complaints of new onset (4-61m) SOB  Eval > PFTs and echo  As Below  Underwent CRT upgrade 8/20  Moderately limited in his activity.  Dyspnea with modest exertion.  Peripheral edema.  Remains exuberant in his volume intake, still greater than a gallon a day.  Takes his diuretics but with some reluctance.  Salt intake is also replete  No chest pain.  DATE TEST EF   2006 LHC 60-65 %   7/20 Echo   20-25 %   3/21 Echo  25-30%       Date Cr K Hgb  12/18 1.13 4.3 14.5   2/20 1.14 4.3 14.1  8/20 1.13 4.2 14.6  8/21 1.19 4.1 15.3          He has  told me of the death of his two sons, one from cancer and one from cocaine overdose  And Gods calling him back via [employer] so L[employer] that he Gave Works now with  [employer]    Past Medical History:  Diagnosis Date  . CHB (complete heart block) ([place])   . Edema    Suspect diastolic heart failure  . Hypertension   . Long Q-T syndrome   . Obesity (BMI 30-39.9)   . Obstructive sleep apnea   . Pacemaker    Medtronic Adapta L model ADDRL 1 (serial number [id number]) pacemaker.  . Syncope   . Thyroid disease     Past Surgical History:  Procedure Laterality Date  . BIV UPGRADE N/A 06/16/2019   Procedure: BIVP UPGRADE - BIVPacemake Upgrade;  Surgeon: Deboraha Sprang, MD;  Location: [facility];   Service: Cardiovascular;  Laterality: N/A;  . CARDIAC CATHETERIZATION     [facility]   . CARDIAC CATHETERIZATION  01/2012   [facility]: no significant CAD, EF 40%.   Marland Kitchen LOOP RECORDER EXPLANT N/A 02/04/2012   Procedure: LOOP RECORDER EXPLANT;  Surgeon: Thompson Grayer, MD;  Location: [facility];  Service: Cardiovascular;  Laterality: N/A;  . LOOP RECORDER IMPLANT N/A 01/21/2012   Procedure: LOOP RECORDER IMPLANT;  Surgeon: Deboraha Sprang, MD;  Location: [facility] CATH LAB;  Service: Cardiovascular;  Laterality: N/A;  . OTHER SURGICAL HISTORY  02/04/2012    removal of a previously implanted MDT Reveal XT implantable loop recorder  . PACEMAKER INSERTION  02/04/2012   Medtronic Adapta L model ADDRL 1 (serial number [id number]) pacemaker.  Marland Kitchen PERMANENT PACEMAKER INSERTION N/A 02/04/2012   Procedure: PERMANENT PACEMAKER INSERTION;  Surgeon: Thompson Grayer, MD;  Location[facility] C[facility] LAB;  Service: Cardiovascular;  Laterality: N/A;    Current Outpatient Medications  Medication Sig Dispense Refill  . Ascorbic Acid (VITAMIN C) 1000 MG tablet Take 1,000 mg by mouth 2 (two) times daily.    Marland Kitchen ELIQUIS 5 MG TABS tablet Take 1  tablet (5 mg total) by mouth 2 (two) times daily. 180 tablet 1  . furosemide (LASIX) 40 MG tablet Take 1 tablet (40 mg total) by mouth daily. 90 tablet 3  . losartan (COZAAR) 25 MG tablet Take 1 tablet (25 mg total) by mouth daily. 90 tablet 3  . metoprolol succinate (TOPROL-XL) 25 MG 24 hr tablet Take 1 tablet (25 mg total) by mouth daily. 90 tablet 3  . spironolactone (ALDACTONE) 25 MG tablet Take 0.5 tablets (12.5 mg total) by mouth daily. Take 0.5 tablet (12.5 mg) by mouth once daily. Pt needs to keep upcoming appt in March for further refills 30 tablet 0   No current facility-administered medications for this visit.    Allergies  Allergen Reactions  . Other     LONG QT SYNDROME--MANY DRUGS PATIENT HAS TO AVOID SEE WEBSITE FOR DETAILS Http://www.sads.org.uk/drugs_to_avoid.htm     Review of Systems  negative except from HPI and PMH  Physical Exam BP 140/78 (BP Location: Left Arm, Patient Position: Sitting, Cuff Size: Normal)   Pulse 62   Ht 6' (1.829 m)   Wt 295 lb (133.8 kg)   SpO2 96%   BMI 40.01 kg/m  Well developed and Morbidly obese  in no acute distress HENT normal Neck supple   Clear Device pocket well healed; without hematoma or erythema.  There is no tethering  Regular rate and rhythm, no  gallop No  murmur Abd-soft with active BS No Clubbing cyanosis 2-3+ edema Skin-warm and dry A & Oriented  Grossly normal sensory and motor function  ECG sinus with P synchronous pacing with a very abbreviated AV delay 115 ms Interval 10/06/1943 QRS lead 1 and QS lead V1    Assessment and  Plan  Complete Heart block    Cardiomyopathy-new  Congestive heart failure-chronic systolic  Pacemaker--CRT-D Medtronic The patient's device was interrogated.  The information was reviewed. No changes were made in the programming.    .    OSA      HTN  Atrial fibrillation-paroxysmal  No intercurrent A. fib or flutter, no ventricular tachycardia  Remains volume overloaded We have discussed the physiology of heart failure including the importance of salt restriction and fluid restriction and have reviewed sources of dietary salt and water.   We will have him increase his furosemide to 40 twice daily for a week and then resume 40 daily.  He asked for handicap sticker, we gave him 1 for 30 days so as to allow him to be more intentional about his diuresis.  We will need a weight of less than 275 to refill it  Discussion about the role of losartan and his cardiomyopathy.  He noted that his blood pressure did not change when he stopped it.  Reiterated that it has mortality and morbidity reduction benefits.  Continue Aldactone.  We will also make a transition from losartan--Entresto and then after that add an SGLT2

## 2020-12-26 NOTE — Patient Instructions (Signed)
Medication Instructions:  - Your physician has recommended you make the following change in your medication:   1) INCREASE lasix (furosemide) 40 mg: - take 1 tablet (40 mg) by mouth TWICE daily x 2 weeks, then - resume 1 tablet (40 mg) by mouth ONCE daily  *If you need a refill on your cardiac medications before your next appointment, please call your pharmacy*   Lab Work: - Your physician recommends that you have lab work today: BMP/ CBC  If you have labs (blood work) drawn today and your tests are completely normal, you will receive your results only by: Marland Kitchen MyChart Message (if you have MyChart) OR . A paper copy in the mail If you have any lab test that is abnormal or we need to change your treatment, we will call you to review the results.   Testing/Procedures: - none ordered   Follow-Up: At [facility], you and your health needs are our priority.  As part of our continuing mission to provide you with exceptional heart care, we have created designated Provider Care Teams.  These Care Teams include your primary Cardiologist (physician) and Advanced Practice Providers (APPs -  Physician Assistants and Nurse Practitioners) who all work together to provide you with the care you need, when you need it.  We recommend signing up for the patient portal called "MyChart".  Sign up information is provided on this After Visit Summary.  MyChart is used to connect with patients for Virtual Visits (Telemedicine).  Patients are able to view lab/test results, encounter notes, upcoming appointments, etc.  Non-urgent messages can be sent to your provider as well.   To learn more about what you can do with MyChart, go to [email or web address].    Your next appointment:   1) in 6 months with an APP   2) in 1 year with Dr. Caryl Comes  The format for your next appointment:   In Person  Provider:   As above   Other Instructions - You have been given a handicap parking placard application that is  good for 1 month  - please call the office/ send a MyChart message in 1 month with you weights and how you are feeling

## 2020-12-27 LAB — BASIC METABOLIC PANEL
BUN/Creatinine Ratio: 14 (ref 10–24)
BUN: 15 mg/dL (ref 8–27)
CO2: 23 mmol/L (ref 20–29)
Calcium: 9.2 mg/dL (ref 8.6–10.2)
Chloride: 103 mmol/L (ref 96–106)
Creatinine, Ser: 1.09 mg/dL (ref 0.76–1.27)
Glucose: 98 mg/dL (ref 65–99)
Potassium: 4.5 mmol/L (ref 3.5–5.2)
Sodium: 143 mmol/L (ref 134–144)
eGFR: 72 mL/min/{1.73_m2} (ref >59)

## 2020-12-27 LAB — CBC WITH DIFFERENTIAL/PLATELET
Basophils Absolute: 0 10*3/uL (ref 0.0–0.2)
Basos: 0 %
EOS (ABSOLUTE): 0.1 10*3/uL (ref 0.0–0.4)
Eos: 2 %
Hematocrit: 47.3 % (ref 37.5–51.0)
Hemoglobin: 15.7 g/dL (ref 13.0–17.7)
Immature Grans (Abs): 0 10*3/uL (ref 0.0–0.1)
Immature Granulocytes: 0 %
Lymphocytes Absolute: 1.5 10*3/uL (ref 0.7–3.1)
Lymphs: 22 %
MCH: 26.6 pg (ref 26.6–33.0)
MCHC: 33.2 g/dL (ref 31.5–35.7)
MCV: 80 fL (ref 79–97)
Monocytes Absolute: 0.6 10*3/uL (ref 0.1–0.9)
Monocytes: 9 %
Neutrophils Absolute: 4.6 10*3/uL (ref 1.4–7.0)
Neutrophils: 67 %
Platelets: 142 10*3/uL — ABNORMAL LOW (ref 150–450)
RBC: 5.9 x10E6/uL — ABNORMAL HIGH (ref 4.14–5.80)
RDW: 13.1 % (ref 11.6–15.4)
WBC: 7 10*3/uL (ref 3.4–10.8)

## 2021-01-01 ENCOUNTER — Telehealth: Payer: Self-pay | Admitting: Internal Medicine

## 2021-01-01 NOTE — Telephone Encounter (Signed)
Attempted to schedule.  LMOV to call office.  ° °

## 2021-01-01 NOTE — Telephone Encounter (Signed)
-----   Message from Emily Filbert, RN sent at 01/01/2021 12:19 PM EDT ----- Do you mind reaching out to this patient. Dr. Caryl Comes saw him on 12/26/20 and was going to have him see an APP in 6 months and himself in 1 year.  He changed the dose on his fluid pill for a short course at that visit and decided after the visit he would like him to come back in 1-2 months (instead of 6 months) with an APP.  Please call to arrange for an appt in 1-2 months with any APP- thanks!!

## 2021-01-20 ENCOUNTER — Other Ambulatory Visit: Payer: Self-pay | Admitting: Internal Medicine

## 2021-01-22 NOTE — Telephone Encounter (Signed)
This is a [place] pt 

## 2021-01-30 DIAGNOSIS — M5127 Other intervertebral disc displacement, lumbosacral region: Secondary | ICD-10-CM | POA: Diagnosis not present

## 2021-01-30 DIAGNOSIS — M9903 Segmental and somatic dysfunction of lumbar region: Secondary | ICD-10-CM | POA: Diagnosis not present

## 2021-01-30 DIAGNOSIS — M4607 Spinal enthesopathy, lumbosacral region: Secondary | ICD-10-CM | POA: Diagnosis not present

## 2021-01-30 DIAGNOSIS — M4608 Spinal enthesopathy, sacral and sacrococcygeal region: Secondary | ICD-10-CM | POA: Diagnosis not present

## 2021-02-06 ENCOUNTER — Ambulatory Visit: Payer: Medicare Other | Admitting: Family

## 2021-02-14 ENCOUNTER — Ambulatory Visit (INDEPENDENT_AMBULATORY_CARE_PROVIDER_SITE_OTHER): Payer: Medicare Other

## 2021-02-14 DIAGNOSIS — I442 Atrioventricular block, complete: Secondary | ICD-10-CM | POA: Diagnosis not present

## 2021-02-14 LAB — CUP PACEART REMOTE DEVICE CHECK
Battery Remaining Longevity: 88 mo
Battery Voltage: 2.99 V
Brady Statistic AP VP Percent: 61.43 %
Brady Statistic AP VS Percent: 0.04 %
Brady Statistic AS VP Percent: 35.87 %
Brady Statistic AS VS Percent: 2.63 %
Brady Statistic RA Percent Paced: 50.61 %
Brady Statistic RV Percent Paced: 97.55 %
Date Time Interrogation Session: 20220427004527
Implantable Lead Implant Date: 20130416
Implantable Lead Implant Date: 20130416
Implantable Lead Implant Date: 20200826
Implantable Lead Location: 753858
Implantable Lead Location: 753859
Implantable Lead Location: 753860
Implantable Lead Model: 5076
Implantable Lead Model: 5076
Implantable Pulse Generator Implant Date: 20200826
Lead Channel Impedance Value: 1045 Ohm
Lead Channel Impedance Value: 1064 Ohm
Lead Channel Impedance Value: 1102 Ohm
Lead Channel Impedance Value: 1140 Ohm
Lead Channel Impedance Value: 1178 Ohm
Lead Channel Impedance Value: 285 Ohm
Lead Channel Impedance Value: 342 Ohm
Lead Channel Impedance Value: 342 Ohm
Lead Channel Impedance Value: 380 Ohm
Lead Channel Impedance Value: 570 Ohm
Lead Channel Impedance Value: 627 Ohm
Lead Channel Impedance Value: 627 Ohm
Lead Channel Impedance Value: 646 Ohm
Lead Channel Impedance Value: 988 Ohm
Lead Channel Pacing Threshold Amplitude: 1 V
Lead Channel Pacing Threshold Amplitude: 1.25 V
Lead Channel Pacing Threshold Amplitude: 1.5 V
Lead Channel Pacing Threshold Pulse Width: 0.4 ms
Lead Channel Pacing Threshold Pulse Width: 0.4 ms
Lead Channel Pacing Threshold Pulse Width: 1 ms
Lead Channel Sensing Intrinsic Amplitude: 2.125 mV
Lead Channel Sensing Intrinsic Amplitude: 2.125 mV
Lead Channel Setting Pacing Amplitude: 2 V
Lead Channel Setting Pacing Amplitude: 2.5 V
Lead Channel Setting Pacing Amplitude: 2.5 V
Lead Channel Setting Pacing Pulse Width: 0.4 ms
Lead Channel Setting Pacing Pulse Width: 1 ms
Lead Channel Setting Sensing Sensitivity: 4 mV

## 2021-02-16 NOTE — Telephone Encounter (Signed)
Attempted to schedule.  LMOV to call office.  ° °

## 2021-02-17 ENCOUNTER — Other Ambulatory Visit: Payer: Self-pay | Admitting: Internal Medicine

## 2021-02-19 ENCOUNTER — Ambulatory Visit (INDEPENDENT_AMBULATORY_CARE_PROVIDER_SITE_OTHER): Payer: Medicare Other | Admitting: Physician Assistant

## 2021-02-19 ENCOUNTER — Other Ambulatory Visit: Payer: Self-pay

## 2021-02-19 ENCOUNTER — Telehealth: Payer: Self-pay

## 2021-02-19 ENCOUNTER — Encounter: Payer: Self-pay | Admitting: Physician Assistant

## 2021-02-19 VITALS — BP 130/70 | HR 80 | Ht 72.0 in | Wt 293.0 lb

## 2021-02-19 DIAGNOSIS — I480 Paroxysmal atrial fibrillation: Secondary | ICD-10-CM

## 2021-02-19 DIAGNOSIS — G4733 Obstructive sleep apnea (adult) (pediatric): Secondary | ICD-10-CM

## 2021-02-19 DIAGNOSIS — I5020 Unspecified systolic (congestive) heart failure: Secondary | ICD-10-CM | POA: Diagnosis not present

## 2021-02-19 DIAGNOSIS — Z9989 Dependence on other enabling machines and devices: Secondary | ICD-10-CM | POA: Diagnosis not present

## 2021-02-19 DIAGNOSIS — I10 Essential (primary) hypertension: Secondary | ICD-10-CM

## 2021-02-19 DIAGNOSIS — Z950 Presence of cardiac pacemaker: Secondary | ICD-10-CM

## 2021-02-19 DIAGNOSIS — I442 Atrioventricular block, complete: Secondary | ICD-10-CM | POA: Diagnosis not present

## 2021-02-19 MED ORDER — ELIQUIS 5 MG PO TABS: 1.0000 | ORAL_TABLET | Freq: Two times a day (BID) | ORAL | 1 refills | Status: DC

## 2021-02-19 NOTE — Patient Instructions (Signed)
Medication Instructions:  No changes  *If you need a refill on your cardiac medications before your next appointment, please call your pharmacy*   Lab Work: None  If you have labs (blood work) drawn today and your tests are completely normal, you will receive your results only by: Marland Kitchen MyChart Message (if you have MyChart) OR . A paper copy in the mail If you have any lab test that is abnormal or we need to change your treatment, we will call you to review the results.   Testing/Procedures: None   Follow-Up: At [facility], you and your health needs are our priority.  As part of our continuing mission to provide you with exceptional heart care, we have created designated Provider Care Teams.  These Care Teams include your primary Cardiologist (physician) and Advanced Practice Providers (APPs -  Physician Assistants and Nurse Practitioners) who all work together to provide you with the care you need, when you need it.  Your next appointment:   3 month(s)  The format for your next appointment:   In Person  Provider:   Virl Axe, MD or Christell Faith, PA-C

## 2021-02-19 NOTE — Addendum Note (Signed)
Addended by: Allean Found on: 02/19/2021 03:08 PM   Modules accepted: Orders

## 2021-02-19 NOTE — Telephone Encounter (Signed)
67m, 132.9kg, scr 1.09 12/26/20, lovw/ryan dunn today

## 2021-02-19 NOTE — Telephone Encounter (Signed)
*  STAT* If patient is at the pharmacy, call can be transferred to refill team.   1. Which medications need to be refilled? (please list name of each medication and dose if known)  Eliquis 2. Which pharmacy/location (including street and city if local pharmacy) is medication to be sent to? [facility]  3. Do they need a 30 day or 90 day supply? 90  

## 2021-02-19 NOTE — Telephone Encounter (Signed)
Prescription refill request for Eliquis received. Indication: PAF Last office visit: 02/19/21 Scr: 1.09 on 12/26/20 Age:  75 Weight: 132.9kg  Based on above findings Eliquis 5mg  twice daily is the appropriate dose.  Refill approved.

## 2021-02-19 NOTE — Progress Notes (Signed)
Cardiology Office Note    Date:  02/19/2021   ID:  William Pica., DOB 27-Nov-1945, MRN [id number]  PCP:  [name], DO  Cardiologist:  Virl Axe, MD  Electrophysiologist:  Virl Axe, MD   Chief Complaint: Follow-up  History of Present Illness:   William Farra. is a 75 y.o. male with history of syncope with prolonged QT interval status post loop recorder with recurrent syncope and symptomatic intermittent complete heart block identified status post PPM in 2013, HFrEF status post CRT upgrade in 05/2019, PAF on Eliquis sleep apnea on CPAP, and HTN who presents for follow-up of volume overload.  LHC in 2006 with an EF of 60 to 65%.  Further details are unavailable.  Echo in 04/2019 showed an EF of 20 to 25%.  In this setting he underwent CRT upgrade in 05/2019.  Following this he underwent repeat echo in 12/2019 which showed a persistent cardiomyopathy with an EF of 25 to 30%, global hypokinesis, indeterminate LV diastolic function parameters, low normal RV systolic function with normal RV ventricular cavity size, and trivial mitral regurgitation.  He was most recently seen by his primary cardiologist in 12/2020 with dyspnea noted with moderate exertion as well as peripheral edema.  He continues to drink large quantities of liquids.  With regards to his previously noted PAF no intercurrent A. fib or flutter was noted on device interrogation.  He was volume overloaded.  His weight was down 5 pounds when compared to his visit in 12/2019.  His Lasix was increased to 40 mg twice daily for 1 week followed by resumption of 40 mg daily thereafter.  He comes in doing well from a cardiac perspective.  He did note an improvement in his volume status/shortness of breath while on higher dose furosemide 40 mg twice daily.  He is now back to taking Lasix 40 mg daily.  He has cut back on his fluid intake significantly.  He does try and watch his salt intake, not adding salt to food.  His weight  is down 2 pounds when compared to his last clinic visit.  He does not have a formal exercise regimen given significant fatigue following a full days work.  No angina.  He does receive what sounds like IV vitamins/minerals through a local clinic not associated with [facility] for review.    Labs independently reviewed: 12/2020 - Hgb 15.7, PLT 142, BUN 15, serum creatinine 1.09, potassium 4.5 05/2020 - TSH normal 09/2017 - albumin 3.9, AST/ALT normal, TC 176, TG 147, HDL 41, LDL 109, A1c 5.4  Past Medical History:  Diagnosis Date  . CHB (complete heart block) ([place])   . Edema    Suspect diastolic heart failure  . Hypertension   . Long Q-T syndrome   . Obesity (BMI 30-39.9)   . Obstructive sleep apnea   . Pacemaker    Medtronic Adapta L model ADDRL 1 (serial number [id number]) pacemaker.  . Syncope   . Thyroid disease     Past Surgical History:  Procedure Laterality Date  . BIV UPGRADE N/A 06/16/2019   Procedure: BIVP UPGRADE - BIVPacemake Upgrade;  Surgeon: Deboraha Sprang, MD;  Location: [facility];  Service: Cardiovascular;  Laterality: N/A;  . CARDIAC CATHETERIZATION     [facility]   . CARDIAC CATHETERIZATION  01/2012   [facility]: no significant CAD, EF 40%.   Marland Kitchen LOOP RECORDER EXPLANT N/A 02/04/2012   Procedure: LOOP RECORDER EXPLANT;  Surgeon: Thompson Grayer, MD;  Location: [facility] CATH LAB;  Service: Cardiovascular;  Laterality: N/A;  . LOOP RECORDER IMPLANT N/A 01/21/2012   Procedure: LOOP RECORDER IMPLANT;  Surgeon: Deboraha Sprang, MD;  Location: [facility] CATH LAB;  Service: Cardiovascular;  Laterality: N/A;  . OTHER SURGICAL HISTORY  02/04/2012    removal of a previously implanted MDT Reveal XT implantable loop recorder  . PACEMAKER INSERTION  02/04/2012   Medtronic Adapta L model ADDRL 1 (serial number [id number]) pacemaker.  Marland Kitchen PERMANENT PACEMAKER INSERTION N/A 02/04/2012   Procedure: PERMANENT PACEMAKER INSERTION;  Surgeon: Thompson Grayer, MD;  Location: [facility] CATH LAB;  Service:  Cardiovascular;  Laterality: N/A;    Current Medications: Current Meds  Medication Sig  . Ascorbic Acid (VITAMIN C) 1000 MG tablet Take 1,000 mg by mouth 2 (two) times daily.  . furosemide (LASIX) 40 MG tablet Take 1 tablet (40 mg total) by mouth daily.  Marland Kitchen losartan (COZAAR) 25 MG tablet Take 1 tablet (25 mg total) by mouth daily.  . metoprolol succinate (TOPROL-XL) 25 MG 24 hr tablet Take 1 tablet (25 mg total) by mouth daily.  Marland Kitchen Barlow extract daily  . spironolactone (ALDACTONE) 25 MG tablet Take 0.5 tablet (12.5 mg) by mouth once daily.  . [DISCONTINUED] ELIQUIS 5 MG TABS tablet Take 1 tablet (5 mg total) by mouth 2 (two) times daily.    Allergies:   Other   Social History   Socioeconomic History  . Marital status: Married    Spouse name: Not on file  . Number of children: Not on file  . Years of education: 30  . Highest education level: 10th grade  Occupational History  . Occupation: Medical illustrator: [employer]  Tobacco Use  . Smoking status: Former Smoker    Packs/day: 4.00    Years: 25.00    Pack years: 100.00    Types: Cigarettes    Quit date: 02/06/1988    Years since quitting: 33.0  . Smokeless tobacco: Former Network engineer  . Vaping Use: Never used  Substance and Sexual Activity  . Alcohol use: Yes    Alcohol/week: 0.0 standard drinks    Comment: occassional--special occassions  . Drug use: No  . Sexual activity: Not on file  Other Topics Concern  . Not on file  Social History Narrative   Lives in [place], with wife. No children, 2 sons deceased. No pets.      Pt owns [employer]. Quit tobacco at age 10. He lives with his wife. Pt is not active..the patient father had a history of long QT and syncope and died at age 60. His mother died at age 37 of cancer.    Social Determinants of Health   Financial Resource Strain: Not on file  Food Insecurity: Not on file   Transportation Needs: Not on file  Physical Activity: Not on file  Stress: Not on file  Social Connections: Not on file     Family History:  The patient's family history includes Arrhythmia in his brother and father; Bone cancer in his son; Cancer in his mother; Kidney disease in his father.  ROS:   Review of Systems  Constitutional: Positive for malaise/fatigue. Negative for chills, diaphoresis, fever and weight loss.  HENT: Negative for congestion.   Eyes: Negative for discharge and redness.  Respiratory: Negative for cough, sputum production, shortness of breath and wheezing.   Cardiovascular: Negative for chest pain, palpitations, orthopnea, claudication,  leg swelling and PND.  Gastrointestinal: Negative for abdominal pain, blood in stool, heartburn, melena, nausea and vomiting.  Musculoskeletal: Negative for falls and myalgias.  Skin: Negative for rash.  Neurological: Negative for dizziness, tingling, tremors, sensory change, speech change, focal weakness, loss of consciousness and weakness.  Endo/Heme/Allergies: Does not bruise/bleed easily.  Psychiatric/Behavioral: Negative for substance abuse. The patient is not nervous/anxious.   All other systems reviewed and are negative.    EKGs/Labs/Other Studies Reviewed:    Studies reviewed were summarized above. The additional studies were reviewed today:  2D echo 12/2019: 1. Left ventricular ejection fraction, by estimation, is 25 to 30%. The  left ventricle has severely decreased function. The left ventricle  demonstrates global hypokinesis. There is mild left ventricular  hypertrophy. Left ventricular diastolic parameters  are indeterminate.  2. Right ventricular systolic function is low normal. The right  ventricular size is normal.  3. The mitral valve is grossly normal. Trivial mitral valve  regurgitation.  4. The aortic valve was not well visualized. Aortic valve regurgitation  is not visualized.  5. The inferior  vena cava is normal in size with greater than 50%  respiratory variability, suggesting right atrial pressure of 3 mmHg. __________  2D echo 04/2019: 1. The left ventricle has severely reduced systolic function, with an  ejection fraction of 20-25%. The cavity size was moderately dilated. There  is moderately increased left ventricular wall thickness. Left ventricular  diastolic Doppler parameters are  indeterminate. Left ventricular diffuse hypokinesis.  2. The right ventricle has normal systolic function. The cavity was  normal. There is no increase in right ventricular wall thickness. Right  ventricular systolic pressure is moderately elevated with an estimated  pressure of 43.6 mmHg.  3. Right atrial size was mildly dilated.  4. There is dilatation of the aortic root 4.2 cm and of the ascending  aorta 3.6 cm.   EKG:  EKG is ordered today.  The EKG ordered today demonstrates a sensed V paced rhythm, 80 bpm, rare PVC  Recent Labs: 05/22/2020: TSH 2.479 12/26/2020: BUN 15; Creatinine, Ser 1.09; Hemoglobin 15.7; Platelets 142; Potassium 4.5; Sodium 143  Recent Lipid Panel    Component Value Date/Time   CHOL 176 09/23/2017 0935   CHOL 180 06/05/2015 0929   CHOL 145 01/20/2012 0311   TRIG 147 09/23/2017 0935   TRIG 95 01/20/2012 0311   HDL 41 09/23/2017 0935   HDL 37 (L) 06/05/2015 0929   HDL 30 (L) 01/20/2012 0311   CHOLHDL 4.3 09/23/2017 0935   VLDL 27 04/30/2016 0846   VLDL 19 01/20/2012 0311   LDLCALC 109 (H) 09/23/2017 0935   LDLCALC 96 01/20/2012 0311   LDLDIRECT 125.1 08/03/2012 1614    PHYSICAL EXAM:    VS:  BP 130/70 (BP Location: Left Arm, Patient Position: Sitting, Cuff Size: Large)   Pulse 80   Ht 6' (1.829 m)   Wt 293 lb (132.9 kg)   SpO2 97%   BMI 39.74 kg/m   BMI: Body mass index is 39.74 kg/m.  Physical Exam Vitals reviewed.  Constitutional:      Appearance: He is well-developed.  HENT:     Head: Normocephalic and atraumatic.  Eyes:     General:         Right eye: No discharge.        Left eye: No discharge.  Neck:     Vascular: No JVD.  Cardiovascular:     Rate and Rhythm: Normal rate and regular rhythm.  Pulses: No midsystolic click and no opening snap.          Posterior tibial pulses are 2+ on the right side and 2+ on the left side.     Heart sounds: Normal heart sounds, S1 normal and S2 normal. Heart sounds not distant. No murmur heard. No friction rub.  Pulmonary:     Effort: Pulmonary effort is normal. No respiratory distress.     Breath sounds: Normal breath sounds. No decreased breath sounds, wheezing or rales.  Chest:     Chest wall: No tenderness.  Abdominal:     General: There is no distension.     Palpations: Abdomen is soft.     Tenderness: There is no abdominal tenderness.  Musculoskeletal:     Cervical back: Normal range of motion.     Right lower leg: Edema present.     Left lower leg: Edema present.     Comments: Trace bilateral pretibial edema  Skin:    General: Skin is warm and dry.     Nails: There is no clubbing.  Neurological:     Mental Status: He is alert and oriented to person, place, and time.  Psychiatric:        Speech: Speech normal.        Behavior: Behavior normal.        Thought Content: Thought content normal.        Judgment: Judgment normal.     Wt Readings from Last 3 Encounters:  02/19/21 293 lb (132.9 kg)  12/26/20 295 lb (133.8 kg)  07/04/20 287 lb (130.2 kg)     ASSESSMENT & PLAN:   1. HFrEF: Volume status is difficult to assess on physical exam secondary to body habitus, though his weight is down 2 pounds when compared to his last clinic visit and 7 pounds when compared to his visit in 2021.  NYHA class is difficult to assess secondary to limited functional status in the context of morbid obesity with significant physical deconditioning.  With recent outpatient diuresis he declines follow-up BMP today.  I initially wanted to further optimize his GDMT with transition of  losartan to [facility] however, he was hesitant to make this transition, though did agree if we had samples.  Unfortunately, we did not have any Entresto samples in the office.  Therefore, we are unable to optimize GDMT with the addition of Entresto at this time as he is hesitant regarding cost.  Ideally, we would optimize GDMT with transition of losartan to [facility] and add an SGLT2i followed by repeating limited echo in several months time to reassess LVSF.  For now, he will continue losartan, metoprolol succinate, and spironolactone.  Perhaps when he is seen in follow-up with his primary cardiologist GDMT optimization can be revisited.  CHF education.  It is noted he receives what sounds like IV fluids/vitamins/minerals through a local clinic with further details being unclear.  It was discussed with him this can lead to volume overload.  2. PAF: Currently in a paced rhythm.  He remains on metoprolol as above.  Given a CHADS2VASc of 3 (CHF, HTN, age x 1), he remains on Eliquis without any symptoms concerning for bleeding.  3. Complete heart block: Status post CRT-P.  Followed by EP.  4. HTN: Blood pressure is reasonably controlled.  Continue current medical therapy as outlined above.  5. Morbid obesity with OSA and possible OHS: Weight loss is advised.  Continued CPAP use recommended.  Disposition: F/u with Dr. Caryl Comes or an  APP in 3 months.   Medication Adjustments/Labs and Tests Ordered: Current medicines are reviewed at length with the patient today.  Concerns regarding medicines are outlined above. Medication changes, Labs and Tests ordered today are summarized above and listed in the Patient Instructions accessible in Encounters.   Signed, Christell Faith, PA-C 02/19/2021 3:38 PM     [place] [address], [place] [address] [phone]

## 2021-03-07 NOTE — Progress Notes (Signed)
Remote pacemaker transmission.   

## 2021-03-29 DIAGNOSIS — M9903 Segmental and somatic dysfunction of lumbar region: Secondary | ICD-10-CM | POA: Diagnosis not present

## 2021-03-29 DIAGNOSIS — M4608 Spinal enthesopathy, sacral and sacrococcygeal region: Secondary | ICD-10-CM | POA: Diagnosis not present

## 2021-03-29 DIAGNOSIS — M4607 Spinal enthesopathy, lumbosacral region: Secondary | ICD-10-CM | POA: Diagnosis not present

## 2021-03-29 DIAGNOSIS — M5127 Other intervertebral disc displacement, lumbosacral region: Secondary | ICD-10-CM | POA: Diagnosis not present

## 2021-04-11 DIAGNOSIS — Z48812 Encounter for surgical aftercare following surgery on the circulatory system: Secondary | ICD-10-CM | POA: Diagnosis not present

## 2021-04-11 DIAGNOSIS — M459 Ankylosing spondylitis of unspecified sites in spine: Secondary | ICD-10-CM | POA: Diagnosis not present

## 2021-04-30 DIAGNOSIS — M545 Low back pain, unspecified: Secondary | ICD-10-CM | POA: Diagnosis not present

## 2021-05-07 DIAGNOSIS — M545 Low back pain, unspecified: Secondary | ICD-10-CM | POA: Diagnosis not present

## 2021-05-16 ENCOUNTER — Ambulatory Visit (INDEPENDENT_AMBULATORY_CARE_PROVIDER_SITE_OTHER): Payer: Medicare Other

## 2021-05-16 DIAGNOSIS — I442 Atrioventricular block, complete: Secondary | ICD-10-CM | POA: Diagnosis not present

## 2021-05-17 DIAGNOSIS — M545 Low back pain, unspecified: Secondary | ICD-10-CM | POA: Diagnosis not present

## 2021-05-17 LAB — CUP PACEART REMOTE DEVICE CHECK
Battery Remaining Longevity: 91 mo
Battery Voltage: 2.99 V
Brady Statistic AP VP Percent: 33.83 %
Brady Statistic AP VS Percent: 0.05 %
Brady Statistic AS VP Percent: 62.1 %
Brady Statistic AS VS Percent: 3.92 %
Brady Statistic RA Percent Paced: 23.25 %
Brady Statistic RV Percent Paced: 96.45 %
Date Time Interrogation Session: 20220727182558
Implantable Lead Implant Date: 20130416
Implantable Lead Implant Date: 20130416
Implantable Lead Implant Date: 20200826
Implantable Lead Location: 753858
Implantable Lead Location: 753859
Implantable Lead Location: 753860
Implantable Lead Model: 5076
Implantable Lead Model: 5076
Implantable Pulse Generator Implant Date: 20200826
Lead Channel Impedance Value: 1102 Ohm
Lead Channel Impedance Value: 1121 Ohm
Lead Channel Impedance Value: 1159 Ohm
Lead Channel Impedance Value: 1197 Ohm
Lead Channel Impedance Value: 1197 Ohm
Lead Channel Impedance Value: 1292 Ohm
Lead Channel Impedance Value: 304 Ohm
Lead Channel Impedance Value: 361 Ohm
Lead Channel Impedance Value: 380 Ohm
Lead Channel Impedance Value: 418 Ohm
Lead Channel Impedance Value: 627 Ohm
Lead Channel Impedance Value: 646 Ohm
Lead Channel Impedance Value: 684 Ohm
Lead Channel Impedance Value: 722 Ohm
Lead Channel Pacing Threshold Amplitude: 0.875 V
Lead Channel Pacing Threshold Amplitude: 1 V
Lead Channel Pacing Threshold Amplitude: 1.5 V
Lead Channel Pacing Threshold Pulse Width: 0.4 ms
Lead Channel Pacing Threshold Pulse Width: 0.4 ms
Lead Channel Pacing Threshold Pulse Width: 1 ms
Lead Channel Sensing Intrinsic Amplitude: 3.375 mV
Lead Channel Sensing Intrinsic Amplitude: 3.375 mV
Lead Channel Sensing Intrinsic Amplitude: 8.875 mV
Lead Channel Sensing Intrinsic Amplitude: 8.875 mV
Lead Channel Setting Pacing Amplitude: 2 V
Lead Channel Setting Pacing Amplitude: 2 V
Lead Channel Setting Pacing Amplitude: 2.5 V
Lead Channel Setting Pacing Pulse Width: 0.4 ms
Lead Channel Setting Pacing Pulse Width: 1 ms
Lead Channel Setting Sensing Sensitivity: 4 mV

## 2021-05-22 DIAGNOSIS — M545 Low back pain, unspecified: Secondary | ICD-10-CM | POA: Diagnosis not present

## 2021-05-23 DIAGNOSIS — Z6841 Body Mass Index (BMI) 40.0 and over, adult: Secondary | ICD-10-CM | POA: Diagnosis not present

## 2021-05-23 DIAGNOSIS — M545 Low back pain, unspecified: Secondary | ICD-10-CM | POA: Diagnosis not present

## 2021-05-23 DIAGNOSIS — M5136 Other intervertebral disc degeneration, lumbar region: Secondary | ICD-10-CM | POA: Diagnosis not present

## 2021-05-29 ENCOUNTER — Other Ambulatory Visit: Payer: Self-pay

## 2021-05-29 ENCOUNTER — Encounter: Payer: Self-pay | Admitting: Internal Medicine

## 2021-05-29 ENCOUNTER — Ambulatory Visit (INDEPENDENT_AMBULATORY_CARE_PROVIDER_SITE_OTHER): Payer: Medicare Other | Admitting: Internal Medicine

## 2021-05-29 VITALS — BP 118/78 | HR 64 | Ht 72.0 in | Wt 298.0 lb

## 2021-05-29 DIAGNOSIS — I442 Atrioventricular block, complete: Secondary | ICD-10-CM

## 2021-05-29 DIAGNOSIS — Z79899 Other long term (current) drug therapy: Secondary | ICD-10-CM

## 2021-05-29 DIAGNOSIS — I5020 Unspecified systolic (congestive) heart failure: Secondary | ICD-10-CM

## 2021-05-29 DIAGNOSIS — I10 Essential (primary) hypertension: Secondary | ICD-10-CM | POA: Diagnosis not present

## 2021-05-29 DIAGNOSIS — Z950 Presence of cardiac pacemaker: Secondary | ICD-10-CM

## 2021-05-29 DIAGNOSIS — I480 Paroxysmal atrial fibrillation: Secondary | ICD-10-CM

## 2021-05-29 MED ORDER — APIXABAN 5 MG PO TABS: 5.0000 mg | ORAL_TABLET | Freq: Two times a day (BID) | ORAL | 1 refills | Status: DC

## 2021-05-29 MED ORDER — SPIRONOLACTONE 25 MG PO TABS: ORAL_TABLET | ORAL | 3 refills | Status: DC

## 2021-05-29 MED ORDER — FUROSEMIDE 80 MG PO TABS
80.0000 mg | ORAL_TABLET | Freq: Every day | ORAL | 2 refills | Status: DC
Start: 2021-05-29 — End: 2021-12-04

## 2021-05-29 MED ORDER — LOSARTAN POTASSIUM 25 MG PO TABS: 25.0000 mg | ORAL_TABLET | Freq: Every day | ORAL | 3 refills | Status: DC

## 2021-05-29 MED ORDER — METOPROLOL SUCCINATE ER 25 MG PO TB24: 25.0000 mg | ORAL_TABLET | Freq: Every day | ORAL | 3 refills | Status: DC

## 2021-05-29 NOTE — Patient Instructions (Addendum)
Medication Instructions:  - Your physician has recommended you make the following change in your medication:   1) INCREASE lasix (furosemide) to 80 mg- take tablet by mouth once daily   2) All of your cardiac medications have been refill for a 90-day supply   *If you need a refill on your cardiac medications before your next appointment, please call your pharmacy*   Lab Work: - Your physician recommends that you return for lab work in: 2 weeks- BMP/ [place] [facility] at [facility] 1st desk on the right to check in, past the screening table Lab hours: Monday- Friday (7:30 am- 5:30 pm)   If you have labs (blood work) drawn today and your tests are completely normal, you will receive your results only by: MyChart Message (if you have MyChart) OR A paper copy in the mail If you have any lab test that is abnormal or we need to change your treatment, we will call you to review the results.   Testing/Procedures: - Your physician has requested that you have an echocardiogram. Echocardiography is a painless test that uses sound waves to create images of your heart. It provides your doctor with information about the size and shape of your heart and how well your heart's chambers and valves are working. This procedure takes approximately one hour. There are no restrictions for this procedure. There is a possibility that an IV may need to be started during your test to inject an image enhancing agent. This is done to obtain more optimal pictures of your heart. Therefore we ask that you do at least drink some water prior to coming in to hydrate your veins.      Follow-Up: At [facility], you and your health needs are our priority.  As part of our continuing mission to provide you with exceptional heart care, we have created designated Provider Care Teams.  These Care Teams include your primary Cardiologist (physician) and Advanced Practice Providers (APPs -  Physician Assistants and Nurse  Practitioners) who all work together to provide you with the care you need, when you need it.  We recommend signing up for the patient portal called "MyChart".  Sign up information is provided on this After Visit Summary.  MyChart is used to connect with patients for Virtual Visits (Telemedicine).  Patients are able to view lab/test results, encounter notes, upcoming appointments, etc.  Non-urgent messages can be sent to your provider as well.   To learn more about what you can do with MyChart, go to [email or web address].    Your next appointment:   6 month(s)  The format for your next appointment:   In Person  Provider:   You may see Virl Axe, MD or one of the following Advanced Practice Providers on your designated Care Team:   Murray Hodgkins, NP Christell Faith, PA-C Marrianne Mood, PA-C Cadence Kathlen Mody, [place]   Other Instructions N/a

## 2021-05-29 NOTE — Progress Notes (Signed)
Patient ID: William Austria., male   DOB: 11-10-1945, 75 y.o.   MRN: [id number]      Patient Care Team: [name], DO as PCP - General (Family Medicine) Deboraha Sprang, MD as PCP - Electrophysiology (Cardiology) Deboraha Sprang, MD as PCP - Cardiology (Cardiology)   HPI  William Conn. is a 75 y.o. male Seen for chief complaint for pacemaker implanted for symptomatic intermittent complete heart block 2013.  History of syncope with a prolonged QT interval and he had undergone loop recorder insertion. With recurrent syncope heart block was identified.   Treated with beta blockers for hypertension and long QT,  stopped this and is now on losartan   Diagnosed with Sleep apnea and has been improved with CPAP   BP has been much better since the control of his sleep apnea     Saw Pulm 7/20 with complaints of new onset (4-73m SOB  Eval > PFTs and echo  As Below  Underwent CRT upgrade 8/20; associated with perhaps a little bit of functional improvement  Intercurrent diagnosis of ? ankylosing spondylitis currently being evaluated by rheumatology. Is a much better idea 2 weeks instead of weeks ago but is much better rightToday, the patient denies chest pain, orthopnea.  There have been no palpitations, lightheadedness or syncope.  Complains of dyspnea on exertion.  Very limited, less than 100 feet.  Significant edema.  Diet excessively replete with fluid and salt and sweets.  No bleeding on Eliquis   DATE TEST EF   2006 LHC 60-65 %   7/20 Echo   20-25 %   3/21 Echo  25-30%       Date Cr K Hgb  12/18 1.13 4.3 14.5   2/20 1.14 4.3 14.1  8/20 1.13 4.2 14.6  8/21 1.19 4.1 15.3  3/22 1.09 4.5 15.7     He has  told me of the death of his two sons, one from cancer and one from cocaine overdose  And Gods calling him back via "[place]..." Works now with  [employer]    Past Medical History:  Diagnosis Date   CHB (complete heart  block) ([facility])    Edema    Suspect diastolic heart failure   Hypertension    Long Q-T syndrome    Obesity (BMI 30-39.9)    Obstructive sleep apnea    Pacemaker    Medtronic Adapta L model ADDRL 1 (serial number [id number]) pacemaker.   Syncope    Thyroid disease     Past Surgical History:  Procedure Laterality Date   BIV UPGRADE N/A 06/16/2019   Procedure: BIVP UPGRADE - BIVPacemake Upgrade;  Surgeon: KDeboraha Sprang MD;  [facility];  Service: Cardiovascular;  Laterality: N/A;   CARDIAC CATHETERIZATION     [facility]    CARDIAC CATHETERIZATION  01/2012   [facility]: no significant CAD, EF 40%.    LOOP RECORDER EXPLANT N/A 02/04/2012   Procedure: LOOP RECORDER EXPLANT;  Surgeon: JThompson Grayer MD;  [facility];  Service: Cardiovascular;  Laterality: N/A;   LOOP RECORDER IMPLANT N/A 01/21/2012   Procedure: LOOP RECORDER IMPLANT;  Surgeon: SDeboraha Sprang MD;  [facility];  Service: Cardiovascular;  Laterality: N/A;   OTHER SURGICAL HISTORY  02/04/2012    removal of a previously implanted MDT Reveal XT implantable loop recorder   PACEMAKER INSERTION  02/04/2012   Medtronic Adapta L model ADDRL 1 (serial  number [id number]) pacemaker.   PERMANENT PACEMAKER INSERTION N/A 02/04/2012   Procedure: PERMANENT PACEMAKER INSERTION;  Surgeon: Thompson Grayer, MD;  [facility] LAB;  Service: Cardiovascular;  Laterality: N/A;    Current Outpatient Medications  Medication Sig Dispense Refill   apixaban (ELIQUIS) 5 MG TABS tablet Take 1 tablet (5 mg total) by mouth 2 (two) times daily. 180 tablet 1   Ascorbic Acid (VITAMIN C) 1000 MG tablet Take 1,000 mg by mouth 2 (two) times daily.     Coenzyme Q10 (CO Q-10 PO) Take by mouth.     furosemide (LASIX) 40 MG tablet Take 1 tablet (40 mg total) by mouth daily. 90 tablet 3   losartan (COZAAR) 25 MG tablet Take 1 tablet (25 mg total) by mouth daily. 90 tablet 3   MAGNESIUM PO Take by mouth.     meloxicam (MOBIC) 15 MG tablet  Take 15 mg by mouth daily.     metoprolol succinate (TOPROL-XL) 25 MG 24 hr tablet Take 1 tablet (25 mg total) by mouth daily. 90 tablet 1   OVER THE COUNTER MEDICATION Cherry extract daily     spironolactone (ALDACTONE) 25 MG tablet Take 0.5 tablet (12.5 mg) by mouth once daily. 45 tablet 3   No current facility-administered medications for this visit.    Allergies  Allergen Reactions   Other     LONG QT SYNDROME--MANY DRUGS PATIENT HAS TO AVOID SEE WEBSITE FOR DETAILS Http://www.sads.org.uk/drugs_to_avoid.htm     Review of Systems negative except from HPI and PMH  Physical Exam BP 118/78 (BP Location: Left Arm, Patient Position: Sitting, Cuff Size: Normal)   Pulse 64   Ht 6' (1.829 m)   Wt 298 lb (135.2 kg)   SpO2 95%   BMI 40.42 kg/m  Well developed and well nourished in no acute distress Morbidly obese  HENT normal Neck supple  Clear Device pocket well healed; without hematoma or erythema.  There is no tethering  Regular rate and rhythm, no  gallop No murmur Abd-soft with active BS No Clubbing cyanosis 2+ edema Skin-warm and dry A & Oriented  Grossly normal sensory and motor function  ECG AV pacing with a QRS in lead I and an rS in lead V1  Assessment and  Plan  Complete Heart block    Cardiomyopathy-new  Congestive heart failure-chronic systolic  Pacemaker--CRT-D Medtronic     .    OSA       HTN  Atrial fibrillation-paroxysmal Intercurrent atrial fibrillation and flutter-burden has increased from 8%--about 30% over the last year.  Heart rate is controlled as he is complete heart block; not sure how symptomatic it is.  Weight precludes ablation.  We will consider antiarrhythmic therapy but will reassess LV function first.  No bleeding on his Eliquis.  Continue at 5 mg twice daily  Following CRT, will check the echocardiogram.  For now we will continue him on guideline directed therapy including losartan 25 metoprolol 25 and spironolactone 12.5.  Has been  reluctant because of cost to try Entresto or an SGLT2 in the past.  Encouraged him for exercise and weight loss.  Significantly volume overloaded.  We will increase the furosemide from 40--80 mg a day; we will check a metabolic profile on the higher dose in 2 weeks time.  Also check a uric acid

## 2021-05-30 LAB — CBC WITH DIFFERENTIAL/PLATELET
Basophils Absolute: 0 10*3/uL (ref 0.0–0.2)
Basos: 1 %
EOS (ABSOLUTE): 0.1 10*3/uL (ref 0.0–0.4)
Eos: 2 %
Hematocrit: 45.2 % (ref 37.5–51.0)
Hemoglobin: 14.8 g/dL (ref 13.0–17.7)
Immature Grans (Abs): 0 10*3/uL (ref 0.0–0.1)
Immature Granulocytes: 0 %
Lymphocytes Absolute: 1.5 10*3/uL (ref 0.7–3.1)
Lymphs: 21 %
MCH: 26.6 pg (ref 26.6–33.0)
MCHC: 32.7 g/dL (ref 31.5–35.7)
MCV: 81 fL (ref 79–97)
Monocytes Absolute: 0.6 10*3/uL (ref 0.1–0.9)
Monocytes: 8 %
Neutrophils Absolute: 4.8 10*3/uL (ref 1.4–7.0)
Neutrophils: 68 %
Platelets: 128 10*3/uL — ABNORMAL LOW (ref 150–450)
RBC: 5.56 x10E6/uL (ref 4.14–5.80)
RDW: 12.7 % (ref 11.6–15.4)
WBC: 7 10*3/uL (ref 3.4–10.8)

## 2021-05-30 LAB — BASIC METABOLIC PANEL
BUN/Creatinine Ratio: 19 (ref 10–24)
BUN: 20 mg/dL (ref 8–27)
CO2: 24 mmol/L (ref 20–29)
Calcium: 9.1 mg/dL (ref 8.6–10.2)
Chloride: 105 mmol/L (ref 96–106)
Creatinine, Ser: 1.04 mg/dL (ref 0.76–1.27)
Glucose: 98 mg/dL (ref 65–99)
Potassium: 4.5 mmol/L (ref 3.5–5.2)
Sodium: 143 mmol/L (ref 134–144)
eGFR: 76 mL/min/{1.73_m2} (ref >59)

## 2021-06-07 ENCOUNTER — Other Ambulatory Visit: Payer: Self-pay | Admitting: *Deleted

## 2021-06-07 DIAGNOSIS — I480 Paroxysmal atrial fibrillation: Secondary | ICD-10-CM

## 2021-06-07 DIAGNOSIS — Z79899 Other long term (current) drug therapy: Secondary | ICD-10-CM

## 2021-06-07 DIAGNOSIS — I5020 Unspecified systolic (congestive) heart failure: Secondary | ICD-10-CM

## 2021-06-11 NOTE — Progress Notes (Signed)
Remote pacemaker transmission.   

## 2021-06-13 DIAGNOSIS — Z6839 Body mass index (BMI) 39.0-39.9, adult: Secondary | ICD-10-CM | POA: Diagnosis not present

## 2021-06-13 DIAGNOSIS — E669 Obesity, unspecified: Secondary | ICD-10-CM | POA: Diagnosis not present

## 2021-06-13 DIAGNOSIS — M5136 Other intervertebral disc degeneration, lumbar region: Secondary | ICD-10-CM | POA: Diagnosis not present

## 2021-06-13 DIAGNOSIS — M545 Low back pain, unspecified: Secondary | ICD-10-CM | POA: Diagnosis not present

## 2021-06-19 ENCOUNTER — Telehealth: Payer: Self-pay | Admitting: Internal Medicine

## 2021-06-19 NOTE — Telephone Encounter (Signed)
See my chart message

## 2021-06-19 NOTE — Telephone Encounter (Signed)
    1. Has your device fired?   2. Is you device beeping?  3. Are you experiencing draining or swelling at device site?   4. Are you calling to see if we received your device transmission?   5. Have you passed out?   Pt's wife calling to r/s transmission on 10/26, she said they will be at the beach that day and needs to r/s any day in November.    Please route to [place]

## 2021-07-17 ENCOUNTER — Other Ambulatory Visit: Payer: Self-pay | Admitting: *Deleted

## 2021-07-17 ENCOUNTER — Other Ambulatory Visit: Payer: Self-pay

## 2021-07-17 ENCOUNTER — Other Ambulatory Visit (INDEPENDENT_AMBULATORY_CARE_PROVIDER_SITE_OTHER): Payer: Medicare Other

## 2021-07-17 ENCOUNTER — Ambulatory Visit (INDEPENDENT_AMBULATORY_CARE_PROVIDER_SITE_OTHER): Payer: Medicare Other

## 2021-07-17 DIAGNOSIS — Z79899 Other long term (current) drug therapy: Secondary | ICD-10-CM | POA: Diagnosis not present

## 2021-07-17 DIAGNOSIS — I480 Paroxysmal atrial fibrillation: Secondary | ICD-10-CM | POA: Diagnosis not present

## 2021-07-17 DIAGNOSIS — I5020 Unspecified systolic (congestive) heart failure: Secondary | ICD-10-CM

## 2021-07-17 MED ORDER — PERFLUTREN LIPID MICROSPHERE
1.0000 mL | INTRAVENOUS | Status: AC | PRN
Start: 2021-07-17 — End: 2021-07-17
  Administered 2021-07-17: 2 mL via INTRAVENOUS

## 2021-07-18 LAB — BASIC METABOLIC PANEL
BUN/Creatinine Ratio: 18 (ref 10–24)
BUN: 24 mg/dL (ref 8–27)
CO2: 25 mmol/L (ref 20–29)
Calcium: 9.3 mg/dL (ref 8.6–10.2)
Chloride: 99 mmol/L (ref 96–106)
Creatinine, Ser: 1.35 mg/dL — ABNORMAL HIGH (ref 0.76–1.27)
Glucose: 82 mg/dL (ref 70–99)
Potassium: 4.2 mmol/L (ref 3.5–5.2)
Sodium: 141 mmol/L (ref 134–144)
eGFR: 55 mL/min/{1.73_m2} — ABNORMAL LOW (ref >59)

## 2021-07-18 LAB — ECHOCARDIOGRAM COMPLETE
AR max vel: 2.96 cm2
AV Area VTI: 2.72 cm2
AV Area mean vel: 2.58 cm2
AV Mean grad: 5 mmHg
AV Peak grad: 8.2 mmHg
Ao pk vel: 1.43 m/s
Area-P 1/2: 4.36 cm2
Calc EF: 44.7 %
S' Lateral: 2.67 cm
Single Plane A2C EF: 32.7 %
Single Plane A4C EF: 58.1 %

## 2021-07-23 ENCOUNTER — Telehealth: Payer: Self-pay | Admitting: Internal Medicine

## 2021-07-23 DIAGNOSIS — Z79899 Other long term (current) drug therapy: Secondary | ICD-10-CM

## 2021-07-23 DIAGNOSIS — I5020 Unspecified systolic (congestive) heart failure: Secondary | ICD-10-CM

## 2021-07-23 DIAGNOSIS — N289 Disorder of kidney and ureter, unspecified: Secondary | ICD-10-CM

## 2021-07-23 NOTE — Telephone Encounter (Signed)
William Sprang, MD  07/20/2021  5:09 PM EDT     Please Inform Patient   Labs are normal x mild change in renal function   lets repeat in 4 weeks   Thanks      William Sprang, MD  07/20/2021  5:22 PM EDT Back to Top    Please Inform Patient Echo showed improved heart muscle function  Eith EF 25-30>>40-45%  this is great

## 2021-07-23 NOTE — Telephone Encounter (Signed)
Attempted to call the patient. No answer- I left a message to please call back.  

## 2021-07-27 NOTE — Telephone Encounter (Signed)
LMOV to schedule lab

## 2021-07-27 NOTE — Telephone Encounter (Signed)
Attempted to call the patient on his home # with lab/ echo results. No answer- I left a detailed message of results on his home # (ok per DPR).  I did ask that he call back to schedule his repeat BMP to be done in ~ 4 weeks.  I do not need to speak with him unless he has questions.  He will just need to be set up for the lab appointment.   BMP ordered placed.

## 2021-08-06 NOTE — Telephone Encounter (Signed)
Reviewed the patient's chart. He is scheduled for a BMP on 08/23/21.

## 2021-08-07 ENCOUNTER — Other Ambulatory Visit: Payer: Self-pay

## 2021-08-07 ENCOUNTER — Encounter: Payer: Self-pay | Admitting: Adult Health

## 2021-08-07 ENCOUNTER — Ambulatory Visit (INDEPENDENT_AMBULATORY_CARE_PROVIDER_SITE_OTHER): Payer: Medicare Other | Admitting: Adult Health

## 2021-08-07 DIAGNOSIS — Z6841 Body Mass Index (BMI) 40.0 and over, adult: Secondary | ICD-10-CM

## 2021-08-07 DIAGNOSIS — G4733 Obstructive sleep apnea (adult) (pediatric): Secondary | ICD-10-CM

## 2021-08-07 NOTE — Assessment & Plan Note (Signed)
Excellent control on nocturnal CPAP.  Continue on same settings  Plan  Patient Instructions  Keep up good work.  Wear CPAP At bedtime   Work on healthy weight .  Do not drive if sleepy  Follow up with Dr. Mortimer Fries in 1 year and As needed

## 2021-08-07 NOTE — Progress Notes (Signed)
@Patient  ID: William Woods., male    DOB: 01-Aug-1946, 75 y.o.   MRN: [id number]  Chief Complaint  Patient presents with   Follow-up    Referring provider: Nobie Putnam *  HPI: 75 year old male former smoker followed for obstructive sleep apnea Medical history significant for congestive heart failure.  TEST/EVENTS :  11/26/2012-sleep study-AHI 37.7  08/07/2021 Follow up : OSA  Patient returns for a 1 year follow-up.  Patient has underlying severe obstructive sleep apnea is maintained on nocturnal CPAP.  Patient says he is doing well on CPAP.  He wears a CPAP every single night.  Patient says he does have 2 CPAP machines.  When he goes to the beach he has a second machine that he uses for travel.  Patient CPAP download shows excellent compliance with daily average usage at 6.5 hours.  AHI of 3.5.  Patient is on auto CPAP 5 to 20 cm H2O.  Daily average pressure at 13 cm H2O.  Patient says he feels rested with no significant daytime sleepiness.  Patient uses a fullface mask with no significant issues.  Allergies  Allergen Reactions   Other     LONG QT SYNDROME--MANY DRUGS PATIENT HAS TO AVOID SEE WEBSITE FOR DETAILS Http://www.sads.org.uk/drugs_to_avoid.htm     Immunization History  Administered Date(s) Administered   PFIZER(Purple Top)SARS-COV-2 Vaccination 12/17/2019, 01/11/2020    Past Medical History:  Diagnosis Date   CHB (complete heart block) (HCC)    Edema    Suspect diastolic heart failure   Hypertension    Long Q-T syndrome    Obesity (BMI 30-39.9)    Obstructive sleep apnea    Pacemaker    Medtronic Adapta L model ADDRL 1 (serial number [id number]) pacemaker.   Syncope    Thyroid disease     Tobacco History: Social History   Tobacco Use  Smoking Status Former   Packs/day: 4.00   Years: 25.00   Pack years: 100.00   Types: Cigarettes   Quit date: 02/06/1988   Years since quitting: 33.5  Smokeless Tobacco Former   Counseling given: Not  Answered   Outpatient Medications Prior to Visit  Medication Sig Dispense Refill   apixaban (ELIQUIS) 5 MG TABS tablet Take 1 tablet (5 mg total) by mouth 2 (two) times daily. 180 tablet 1   Ascorbic Acid (VITAMIN C) 1000 MG tablet Take 1,000 mg by mouth 2 (two) times daily.     Coenzyme Q10 (CO Q-10 PO) Take by mouth.     furosemide (LASIX) 80 MG tablet Take 1 tablet (80 mg total) by mouth daily. 90 tablet 2   losartan (COZAAR) 25 MG tablet Take 1 tablet (25 mg total) by mouth daily. 90 tablet 3   MAGNESIUM PO Take by mouth.     meloxicam (MOBIC) 15 MG tablet Take 15 mg by mouth daily.     metoprolol succinate (TOPROL-XL) 25 MG 24 hr tablet Take 1 tablet (25 mg total) by mouth daily. 90 tablet 3   OVER THE COUNTER MEDICATION Cherry extract daily     spironolactone (ALDACTONE) 25 MG tablet Take 0.5 tablet (12.5 mg) by mouth once daily. 45 tablet 3   No facility-administered medications prior to visit.     Review of Systems:   Constitutional:   No  weight loss, night sweats,  Fevers, chills, fatigue, or  lassitude.  HEENT:   No headaches,  Difficulty swallowing,  Tooth/dental problems, or  Sore throat,  No sneezing, itching, ear ache, nasal congestion, post nasal drip,   CV:  No chest pain,  Orthopnea, PND, swelling in lower extremities, anasarca, dizziness, palpitations, syncope.   GI  No heartburn, indigestion, abdominal pain, nausea, vomiting, diarrhea, change in bowel habits, loss of appetite, bloody stools.   Resp: No shortness of breath with exertion or at rest.  No excess mucus, no productive cough,  No non-productive cough,  No coughing up of blood.  No change in color of mucus.  No wheezing.  No chest wall deformity  Skin: no rash or lesions.  GU: no dysuria, change in color of urine, no urgency or frequency.  No flank pain, no hematuria   MS:  No joint pain or swelling.  No decreased range of motion.  No back pain.    Physical Exam  BP 128/72 (BP  Location: Left Arm, Patient Position: Sitting, Cuff Size: Normal)   Pulse 87   Temp (!) 96.9 F (36.1 C) (Oral)   Ht 6' (1.829 m)   Wt 295 lb 9.6 oz (134.1 kg)   SpO2 97%   BMI 40.09 kg/m   GEN: A/Ox3; pleasant , NAD, well nourished    HEENT:  [place]/AT,   NOSE-clear, THROAT-clear, no lesions, no postnasal drip or exudate noted. Class 2 MP airway   NECK:  Supple w/ fair ROM; no JVD; normal carotid impulses w/o bruits; no thyromegaly or nodules palpated; no lymphadenopathy.    RESP  Clear  P & A; w/o, wheezes/ rales/ or rhonchi. no accessory muscle use, no dullness to percussion  CARD:  RRR, no m/r/g, tr  peripheral edema, pulses intact, no cyanosis or clubbing.  GI:   Soft & nt; nml bowel sounds; no organomegaly or masses detected.   Musco: Warm bil, no deformities or joint swelling noted.   Neuro: alert, no focal deficits noted.    Skin: Warm, no lesions or rashes    Lab Results:  CBC    Component Value Date/Time   WBC 7.0 05/29/2021 1122   WBC 9.4 05/22/2020 0935   RBC 5.56 05/29/2021 1122   RBC 5.71 05/22/2020 0935   HGB 14.8 05/29/2021 1122   HCT 45.2 05/29/2021 1122   PLT 128 (L) 05/29/2021 1122   MCV 81 05/29/2021 1122   MCV 81 01/20/2012 0311   MCH 26.6 05/29/2021 1122   MCH 26.8 05/22/2020 0935   MCHC 32.7 05/29/2021 1122   MCHC 31.7 05/22/2020 0935   RDW 12.7 05/29/2021 1122   RDW 13.9 01/20/2012 0311   LYMPHSABS 1.5 05/29/2021 1122   LYMPHSABS 1.9 01/20/2012 0311   MONOABS 0.8 05/22/2020 0935   MONOABS 0.6 01/20/2012 0311   EOSABS 0.1 05/29/2021 1122   EOSABS 0.1 01/20/2012 0311   BASOSABS 0.0 05/29/2021 1122   BASOSABS 0.0 01/20/2012 0311    BMET    Component Value Date/Time   NA 141 07/17/2021 1023   NA 143 01/20/2012 0311   K 4.2 07/17/2021 1023   K 4.1 01/20/2012 0311   CL 99 07/17/2021 1023   CL 107 01/20/2012 0311   CO2 25 07/17/2021 1023   CO2 30 01/20/2012 0311   GLUCOSE 82 07/17/2021 1023   GLUCOSE 114 (H) 05/22/2020 0935    GLUCOSE 103 (H) 01/20/2012 0311   BUN 24 07/17/2021 1023   BUN 21 (H) 01/20/2012 0311   CREATININE 1.35 (H) 07/17/2021 1023   CREATININE 1.13 09/23/2017 0935   CALCIUM 9.3 07/17/2021 1023   CALCIUM 8.5 01/20/2012 0311   GFRNONAA >  60 05/22/2020 0935   GFRNONAA 66 09/23/2017 0935   GFRAA >60 05/22/2020 0935   GFRAA 76 09/23/2017 0935    BNP    Component Value Date/Time   BNP 374.0 (H) 04/22/2019 1307    ProBNP    Component Value Date/Time   PROBNP 743.6 (H) 02/04/2012 1632    Imaging: ECHOCARDIOGRAM COMPLETE  Result Date: 07/18/2021    ECHOCARDIOGRAM REPORT   Patient Name:   Fines Kimberlin. Date of Exam: 07/17/2021 Medical Rec #:  [id number]          Height:       72.0 in Accession #:    [id number]         Weight:       298.0 lb Date of Birth:  Jan 23, 1946          BSA:          2.524 m Patient Age:    79 years           BP:           122/78 mmHg Patient Gender: M                  HR:           67 bpm. Exam Location:  [facility] Procedure: 2D Echo, Cardiac Doppler, Color Doppler and Intracardiac            Opacification Agent Indications:    I48.0 Paroxysmal atrial fibrillation; I50.20* Unspecified                 systolic (congestive) heart failure  History:        Patient has prior history of Echocardiogram examinations, most                 recent 12/21/2019. Pacemaker; Risk Factors:Hypertension, Sleep                 Apnea, Dyslipidemia and Former Smoker.  Sonographer:    Caesar Chestnut RDCS, RVT Referring Phys: [place]  1. Left ventricular ejection fraction, by estimation, is 40 to 45%. The left ventricle has mildly decreased function. The left ventricle demonstrates global hypokinesis. There is moderate left ventricular hypertrophy. Left ventricular diastolic parameters are indeterminate.  2. Right ventricular systolic function is normal. The right ventricular size is normal. Tricuspid regurgitation signal is inadequate for assessing PA pressure.  3. Left atrial  size was mildly dilated.  4. Right atrial size was mildly dilated.  5. The mitral valve was not well visualized. Trivial mitral valve regurgitation. No evidence of mitral stenosis.  6. The aortic valve was not well visualized. Aortic valve regurgitation is trivial. No aortic stenosis is present.  7. There is mild dilatation of the ascending aorta, measuring 40 mm.  8. The inferior vena cava is normal in size with greater than 50% respiratory variability, suggesting right atrial pressure of 3 mmHg. Comparison(s): LVEF 25-30%. FINDINGS  Left Ventricle: Left ventricular ejection fraction, by estimation, is 40 to 45%. The left ventricle has mildly decreased function. The left ventricle demonstrates global hypokinesis. Definity contrast agent was given IV to delineate the left ventricular  endocardial borders. The left ventricular internal cavity size was normal in size. There is moderate left ventricular hypertrophy. Left ventricular diastolic parameters are indeterminate. Right Ventricle: The right ventricular size is normal. Right vetricular wall thickness was not well visualized. Right ventricular systolic function is normal. Tricuspid regurgitation signal is inadequate for assessing PA pressure. Left Atrium: Left  atrial size was mildly dilated. Right Atrium: Right atrial size was mildly dilated. Pericardium: Trivial pericardial effusion is present. Mitral Valve: The mitral valve was not well visualized. Trivial mitral valve regurgitation. No evidence of mitral valve stenosis. Tricuspid Valve: The tricuspid valve is not well visualized. Tricuspid valve regurgitation is trivial. Aortic Valve: The aortic valve was not well visualized. Aortic valve regurgitation is trivial. No aortic stenosis is present. Aortic valve mean gradient measures 5.0 mmHg. Aortic valve peak gradient measures 8.2 mmHg. Aortic valve area, by VTI measures 2.72 cm. Pulmonic Valve: The pulmonic valve was not well visualized. Pulmonic valve  regurgitation is trivial. No evidence of pulmonic stenosis. Aorta: The aortic root is normal in size and structure. There is mild dilatation of the ascending aorta, measuring 40 mm. Pulmonary Artery: The pulmonary artery is not well seen. Venous: The inferior vena cava is normal in size with greater than 50% respiratory variability, suggesting right atrial pressure of 3 mmHg. IAS/Shunts: The interatrial septum was not well visualized. Additional Comments: A device lead is visualized in the right ventricle.  LEFT VENTRICLE PLAX 2D LVIDd:         4.72 cm      Diastology LVIDs:         2.67 cm      LV e' medial:    7.83 cm/s LV PW:         1.62 cm      LV E/e' medial:  12.6 LV IVS:        1.53 cm      LV e' lateral:   10.40 cm/s LVOT diam:     2.30 cm      LV E/e' lateral: 9.5 LV SV:         83 LV SV Index:   33 LVOT Area:     4.15 cm  LV Volumes (MOD) LV vol d, MOD A2C: 156.0 ml LV vol d, MOD A4C: 177.0 ml LV vol s, MOD A2C: 105.0 ml LV vol s, MOD A4C: 74.2 ml LV SV MOD A2C:     51.0 ml LV SV MOD A4C:     177.0 ml LV SV MOD BP:      73.5 ml RIGHT VENTRICLE            IVC RV S prime:     9.57 cm/s  IVC diam: 1.70 cm TAPSE (M-mode): 2.6 cm LEFT ATRIUM           Index       RIGHT ATRIUM           Index LA Vol (A4C): 69.2 ml 27.41 ml/m RA Area:     20.56 cm                                   RA Volume:   64.28 ml  25.47 ml/m  AORTIC VALVE                    PULMONIC VALVE AV Area (Vmax):    2.96 cm     PV Vmax:          1.27 m/s AV Area (Vmean):   2.58 cm     PV Peak grad:     6.5 mmHg AV Area (VTI):     2.72 cm     PR End Diast Vel: 7.08 msec AV Vmax:  143.00 cm/s AV Vmean:          104.000 cm/s AV VTI:            0.305 m AV Peak Grad:      8.2 mmHg AV Mean Grad:      5.0 mmHg LVOT Vmax:         102.00 cm/s LVOT Vmean:        64.700 cm/s LVOT VTI:          0.200 m LVOT/AV VTI ratio: 0.66  AORTA Ao Root diam: 3.50 cm Ao Asc diam:  4.00 cm MITRAL VALVE MV Area (PHT): 4.36 cm    SHUNTS MV Decel Time: 174  msec    Systemic VTI:  0.20 m MV E velocity: 98.50 cm/s  Systemic Diam: 2.30 cm MV A velocity: 37.70 cm/s MV E/A ratio:  2.61 Harrell Gave End MD Electronically signed by Nelva Bush MD Signature Date/Time: 07/18/2021/6:56:49 AM    Final     perflutren lipid microspheres (DEFINITY) IV suspension     Date Action Dose Route User   07/17/2021 1020 Given 2 mL Intravenous Walker, Hollie       No flowsheet data found.  No results found for: NITRICOXIDE      Assessment & Plan:   OSA (obstructive sleep apnea) Excellent control on nocturnal CPAP.  Continue on same settings  Plan  Patient Instructions  Keep up good work.  Wear CPAP At bedtime   Work on healthy weight .  Do not drive if sleepy  Follow up with Dr. Mortimer Fries in 1 year and As needed        Morbid obesity with BMI of 40.0-44.9, adult ([place]) Healthy weight loss discussed     Rexene Edison, NP 08/07/2021

## 2021-08-07 NOTE — Assessment & Plan Note (Signed)
Healthy weight loss discussed 

## 2021-08-07 NOTE — Patient Instructions (Addendum)
Keep up good work.  Wear CPAP At bedtime   Work on healthy weight .  Do not drive if sleepy  Follow up with Dr. Mortimer Fries in 1 year and As needed

## 2021-08-23 ENCOUNTER — Other Ambulatory Visit: Payer: Self-pay

## 2021-08-23 ENCOUNTER — Other Ambulatory Visit (INDEPENDENT_AMBULATORY_CARE_PROVIDER_SITE_OTHER): Payer: Medicare Other

## 2021-08-23 DIAGNOSIS — I5020 Unspecified systolic (congestive) heart failure: Secondary | ICD-10-CM

## 2021-08-23 DIAGNOSIS — Z79899 Other long term (current) drug therapy: Secondary | ICD-10-CM

## 2021-08-23 DIAGNOSIS — N289 Disorder of kidney and ureter, unspecified: Secondary | ICD-10-CM | POA: Diagnosis not present

## 2021-08-24 LAB — BASIC METABOLIC PANEL
BUN/Creatinine Ratio: 19 (ref 10–24)
BUN: 22 mg/dL (ref 8–27)
CO2: 26 mmol/L (ref 20–29)
Calcium: 9.2 mg/dL (ref 8.6–10.2)
Chloride: 102 mmol/L (ref 96–106)
Creatinine, Ser: 1.16 mg/dL (ref 0.76–1.27)
Glucose: 97 mg/dL (ref 70–99)
Potassium: 4.5 mmol/L (ref 3.5–5.2)
Sodium: 142 mmol/L (ref 134–144)
eGFR: 67 mL/min/{1.73_m2} (ref >59)

## 2021-09-05 ENCOUNTER — Ambulatory Visit (INDEPENDENT_AMBULATORY_CARE_PROVIDER_SITE_OTHER): Payer: Medicare Other

## 2021-09-05 DIAGNOSIS — I442 Atrioventricular block, complete: Secondary | ICD-10-CM

## 2021-09-05 LAB — CUP PACEART REMOTE DEVICE CHECK
Battery Remaining Longevity: 89 mo
Battery Voltage: 2.98 V
Brady Statistic AP VP Percent: 34.82 %
Brady Statistic AP VS Percent: 0.03 %
Brady Statistic AS VP Percent: 63.12 %
Brady Statistic AS VS Percent: 1.89 %
Brady Statistic RA Percent Paced: 18.22 %
Brady Statistic RV Percent Paced: 98.25 %
Date Time Interrogation Session: 20221116041244
Implantable Lead Implant Date: 20130416
Implantable Lead Implant Date: 20130416
Implantable Lead Implant Date: 20200826
Implantable Lead Location: 753858
Implantable Lead Location: 753859
Implantable Lead Location: 753860
Implantable Lead Model: 5076
Implantable Lead Model: 5076
Implantable Pulse Generator Implant Date: 20200826
Lead Channel Impedance Value: 1159 Ohm
Lead Channel Impedance Value: 1178 Ohm
Lead Channel Impedance Value: 1273 Ohm
Lead Channel Impedance Value: 1311 Ohm
Lead Channel Impedance Value: 1368 Ohm
Lead Channel Impedance Value: 1463 Ohm
Lead Channel Impedance Value: 323 Ohm
Lead Channel Impedance Value: 361 Ohm
Lead Channel Impedance Value: 361 Ohm
Lead Channel Impedance Value: 399 Ohm
Lead Channel Impedance Value: 665 Ohm
Lead Channel Impedance Value: 722 Ohm
Lead Channel Impedance Value: 779 Ohm
Lead Channel Impedance Value: 798 Ohm
Lead Channel Pacing Threshold Amplitude: 0.875 V
Lead Channel Pacing Threshold Amplitude: 0.875 V
Lead Channel Pacing Threshold Amplitude: 1.75 V
Lead Channel Pacing Threshold Pulse Width: 0.4 ms
Lead Channel Pacing Threshold Pulse Width: 0.4 ms
Lead Channel Pacing Threshold Pulse Width: 1 ms
Lead Channel Sensing Intrinsic Amplitude: 2.625 mV
Lead Channel Sensing Intrinsic Amplitude: 2.625 mV
Lead Channel Sensing Intrinsic Amplitude: 8.875 mV
Lead Channel Sensing Intrinsic Amplitude: 8.875 mV
Lead Channel Setting Pacing Amplitude: 2 V
Lead Channel Setting Pacing Amplitude: 2 V
Lead Channel Setting Pacing Amplitude: 2.5 V
Lead Channel Setting Pacing Pulse Width: 0.4 ms
Lead Channel Setting Pacing Pulse Width: 1 ms
Lead Channel Setting Sensing Sensitivity: 4 mV

## 2021-09-06 DIAGNOSIS — E669 Obesity, unspecified: Secondary | ICD-10-CM | POA: Diagnosis not present

## 2021-09-06 DIAGNOSIS — R5382 Chronic fatigue, unspecified: Secondary | ICD-10-CM | POA: Diagnosis not present

## 2021-09-06 DIAGNOSIS — E039 Hypothyroidism, unspecified: Secondary | ICD-10-CM | POA: Diagnosis not present

## 2021-09-07 ENCOUNTER — Encounter: Payer: Self-pay | Admitting: Internal Medicine

## 2021-09-17 NOTE — Progress Notes (Signed)
Remote pacemaker transmission.   

## 2021-10-31 DIAGNOSIS — D239 Other benign neoplasm of skin, unspecified: Secondary | ICD-10-CM | POA: Diagnosis not present

## 2021-10-31 DIAGNOSIS — L578 Other skin changes due to chronic exposure to nonionizing radiation: Secondary | ICD-10-CM | POA: Diagnosis not present

## 2021-10-31 DIAGNOSIS — L821 Other seborrheic keratosis: Secondary | ICD-10-CM | POA: Diagnosis not present

## 2021-11-14 ENCOUNTER — Ambulatory Visit (INDEPENDENT_AMBULATORY_CARE_PROVIDER_SITE_OTHER): Payer: Medicare Other

## 2021-11-14 DIAGNOSIS — I442 Atrioventricular block, complete: Secondary | ICD-10-CM

## 2021-11-14 LAB — CUP PACEART REMOTE DEVICE CHECK
Battery Remaining Longevity: 83 mo
Battery Voltage: 2.98 V
Brady Statistic AP VP Percent: 27.74 %
Brady Statistic AP VS Percent: 0.01 %
Brady Statistic AS VP Percent: 71.07 %
Brady Statistic AS VS Percent: 0.98 %
Brady Statistic RA Percent Paced: 13.25 %
Brady Statistic RV Percent Paced: 99.03 %
Date Time Interrogation Session: 20230125010736
Implantable Lead Implant Date: 20130416
Implantable Lead Implant Date: 20130416
Implantable Lead Implant Date: 20200826
Implantable Lead Location: 753858
Implantable Lead Location: 753859
Implantable Lead Location: 753860
Implantable Lead Model: 5076
Implantable Lead Model: 5076
Implantable Pulse Generator Implant Date: 20200826
Lead Channel Impedance Value: 1140 Ohm
Lead Channel Impedance Value: 1178 Ohm
Lead Channel Impedance Value: 1216 Ohm
Lead Channel Impedance Value: 1273 Ohm
Lead Channel Impedance Value: 1292 Ohm
Lead Channel Impedance Value: 1387 Ohm
Lead Channel Impedance Value: 323 Ohm
Lead Channel Impedance Value: 342 Ohm
Lead Channel Impedance Value: 380 Ohm
Lead Channel Impedance Value: 380 Ohm
Lead Channel Impedance Value: 684 Ohm
Lead Channel Impedance Value: 684 Ohm
Lead Channel Impedance Value: 741 Ohm
Lead Channel Impedance Value: 779 Ohm
Lead Channel Pacing Threshold Amplitude: 0.75 V
Lead Channel Pacing Threshold Amplitude: 1.125 V
Lead Channel Pacing Threshold Amplitude: 2 V
Lead Channel Pacing Threshold Pulse Width: 0.4 ms
Lead Channel Pacing Threshold Pulse Width: 0.4 ms
Lead Channel Pacing Threshold Pulse Width: 1 ms
Lead Channel Sensing Intrinsic Amplitude: 4 mV
Lead Channel Sensing Intrinsic Amplitude: 4 mV
Lead Channel Sensing Intrinsic Amplitude: 8.875 mV
Lead Channel Sensing Intrinsic Amplitude: 8.875 mV
Lead Channel Setting Pacing Amplitude: 2 V
Lead Channel Setting Pacing Amplitude: 2.25 V
Lead Channel Setting Pacing Amplitude: 2.5 V
Lead Channel Setting Pacing Pulse Width: 0.4 ms
Lead Channel Setting Pacing Pulse Width: 1 ms
Lead Channel Setting Sensing Sensitivity: 4 mV

## 2021-11-26 NOTE — Progress Notes (Signed)
Remote pacemaker transmission.   

## 2021-12-04 ENCOUNTER — Ambulatory Visit (INDEPENDENT_AMBULATORY_CARE_PROVIDER_SITE_OTHER): Payer: Medicare Other | Admitting: Internal Medicine

## 2021-12-04 ENCOUNTER — Other Ambulatory Visit: Payer: Self-pay

## 2021-12-04 ENCOUNTER — Encounter: Payer: Self-pay | Admitting: Internal Medicine

## 2021-12-04 VITALS — BP 122/90 | HR 79 | Ht 72.0 in | Wt 295.0 lb

## 2021-12-04 DIAGNOSIS — I10 Essential (primary) hypertension: Secondary | ICD-10-CM

## 2021-12-04 DIAGNOSIS — I5020 Unspecified systolic (congestive) heart failure: Secondary | ICD-10-CM

## 2021-12-04 DIAGNOSIS — Z950 Presence of cardiac pacemaker: Secondary | ICD-10-CM

## 2021-12-04 DIAGNOSIS — I480 Paroxysmal atrial fibrillation: Secondary | ICD-10-CM

## 2021-12-04 DIAGNOSIS — I442 Atrioventricular block, complete: Secondary | ICD-10-CM | POA: Diagnosis not present

## 2021-12-04 LAB — PACEMAKER DEVICE OBSERVATION

## 2021-12-04 MED ORDER — METOPROLOL SUCCINATE ER 25 MG PO TB24: 25.0000 mg | ORAL_TABLET | Freq: Every day | ORAL | 3 refills | Status: DC

## 2021-12-04 MED ORDER — LOSARTAN POTASSIUM 25 MG PO TABS: 25.0000 mg | ORAL_TABLET | Freq: Every day | ORAL | 3 refills | Status: DC

## 2021-12-04 MED ORDER — SPIRONOLACTONE 25 MG PO TABS: ORAL_TABLET | ORAL | 3 refills | Status: DC

## 2021-12-04 MED ORDER — APIXABAN 5 MG PO TABS: 5.0000 mg | ORAL_TABLET | Freq: Two times a day (BID) | ORAL | 1 refills | Status: DC

## 2021-12-04 MED ORDER — FUROSEMIDE 80 MG PO TABS: 80.0000 mg | ORAL_TABLET | Freq: Every day | ORAL | 3 refills | Status: DC

## 2021-12-04 NOTE — Telephone Encounter (Signed)
Eliquis 5 mg refill request received. Patient is 76 years old, weight-133.8 kg, Crea- 1.16 on 08/23/21, Diagnosis- PAF, and last seen by Dr. Caryl Comes on 12/04/21. Dose is appropriate based on dosing criteria. Will send in refill to requested pharmacy.

## 2021-12-04 NOTE — Progress Notes (Addendum)
Patient ID: William Guastella., male   DOB: [birth year], 76 y.o.   MRN: [id number]      Patient Care Team: [name], DO as PCP - General (Family Medicine) Deboraha Sprang, MD as PCP - Electrophysiology (Cardiology) Deboraha Sprang, MD as PCP - Cardiology (Cardiology)   HPI  William Onstott. is a 76 y.o. male Seen for chief complaint for Medtronic pacemaker implanted for symptomatic intermittent complete heart block 2013.   CRT upgrade 8/20  Atrial fibrillation-paroxysmal on Eliquis  History of syncope with a prolonged QT interval and he had undergone loop recorder insertion. With recurrent syncope heart block was identified.   Treated with beta blockers for hypertension and long QT,  stopped this and is now on losartan   Diagnosed with Sleep apnea and has been improved with CPAP    The patient denies chest pain, nocturnal dyspnea, orthopnea or peripheral edema.  There have been no palpitations,  or syncope.  Complains of chronic dyspnea; stable.  More limited now by his back.  Minimal lightheadedness.   DATE TEST EF   2006 LHC 60-65 %   7/20 Echo   20-25 %   3/21 Echo  25-30%   9/22 Echo  40-45%       Date Cr K Hgb  12/18 1.13 4.3 14.5   2/20 1.14 4.3 14.1  8/20 1.13 4.2 14.6  8/21 1.19 4.1 15.3  3/22 1.09 4.5 15.7  11/22 1.16<<1.35 4.5 14.8 (4/09)   Thromboembolic risk factors ( age -43 , HTN-1, DM-1, CHF-1) for a CHADSVASc Score of >=4   He has  told me of the death of his two sons, one from cancer and one from cocaine overdose  And Gods calling him back via "God so Loved that he Gave..." Works now with  [employer]    Past Medical History:  Diagnosis Date   CHB (complete heart block) (HCC)    Edema    Suspect diastolic heart failure   Hypertension    Long Q-T syndrome    Obesity (BMI 30-39.9)    Obstructive sleep apnea    Pacemaker    Medtronic Adapta L model ADDRL 1 (serial number [id number]) pacemaker.   Syncope    Thyroid  disease     Past Surgical History:  Procedure Laterality Date   BIV UPGRADE N/A 06/16/2019   Procedure: BIVP UPGRADE - BIVPacemake Upgrade;  Surgeon: Deboraha Sprang, MD;  Location: [facility];  Service: Cardiovascular;  Laterality: N/A;   CARDIAC CATHETERIZATION     [facility]    CARDIAC CATHETERIZATION  01/2012   [facility]: no significant CAD, EF 40%.    LOOP RECORDER EXPLANT N/A 02/04/2012   Procedure: LOOP RECORDER EXPLANT;  Surgeon: Thompson Grayer, MD;  Location: [facility] CATH LAB;  Service: Cardiovascular;  Laterality: N/A;   LOOP RECORDER IMPLANT N/A 01/21/2012   Procedure: LOOP RECORDER IMPLANT;  Surgeon: Deboraha Sprang, MD;  Location: [facility] CATH LAB;  Service: Cardiovascular;  Laterality: N/A;   OTHER SURGICAL HISTORY  02/04/2012    removal of a previously implanted MDT Reveal XT implantable loop recorder   PACEMAKER INSERTION  02/04/2012   Medtronic Adapta L model ADDRL 1 (serial number [id number]) pacemaker.   PERMANENT PACEMAKER INSERTION N/A 02/04/2012   Procedure: PERMANENT PACEMAKER INSERTION;  Surgeon: Thompson Grayer, MD;  Location[facility] CATH LAB;  Service: Cardiovascular;  Laterality: N/A;    Current Outpatient Medications  Medication Sig Dispense Refill   apixaban (  ELIQUIS) 5 MG TABS tablet Take 1 tablet (5 mg total) by mouth 2 (two) times daily. 180 tablet 1   Ascorbic Acid (VITAMIN C) 1000 MG tablet Take 1,000 mg by mouth 2 (two) times daily.     Coenzyme Q10 (CO Q-10 PO) Take by mouth.     furosemide (LASIX) 80 MG tablet Take 1 tablet (80 mg total) by mouth daily. 90 tablet 2   losartan (COZAAR) 25 MG tablet Take 1 tablet (25 mg total) by mouth daily. 90 tablet 3   MAGNESIUM PO Take by mouth.     meloxicam (MOBIC) 15 MG tablet Take 15 mg by mouth daily.     metoprolol succinate (TOPROL-XL) 25 MG 24 hr tablet Take 1 tablet (25 mg total) by mouth daily. 90 tablet 3   OVER THE COUNTER MEDICATION Cherry extract daily     spironolactone (ALDACTONE) 25 MG tablet Take 0.5 tablet (12.5 mg) by  mouth once daily. 45 tablet 3   No current facility-administered medications for this visit.    Allergies  Allergen Reactions   Other     LONG QT SYNDROME--MANY DRUGS PATIENT HAS TO AVOID SEE WEBSITE FOR DETAILS Http://www.sads.org.uk/drugs_to_avoid.htm     Review of Systems negative except from HPI and PMH  Physical Exam  BP 122/90 (BP Location: Left Arm, Patient Position: Sitting, Cuff Size: Large)   Pulse 79   Ht 6' (1.829 m)   Wt 295 lb (133.8 kg)   SpO2 95%   BMI 40.01 kg/m  Well developed and Morbidly obese in no acute distress HENT normal Neck supple with JVP-flat Clear Device pocket well healed; without hematoma or erythema.  There is no tethering  Regular rate and rhythm, no murmur Abd-soft with active BS No Clubbing cyanosis   edema Skin-warm and dry A & Oriented  Grossly normal sensory and motor function  ECG A pacing 69 14/15/51   Assessment and  Plan  Complete Heart block    LONG QT syndrome ( Dx [facility] )   Cardiomyopathy-new  Congestive heart failure-chronic systolic  Pacemaker--CRT-D Medtronic     .    OSA       HTN  Atrial fibrillation-paroxysmal  Blood pressure well controlled.  For his cardiomyopathy we will also continue metoprolol 25 losartan 25.  And Aldactone 12.5  Increasing burden of atrial fibrillation.  Discussed the value of catheter ablation.  The benefits are likely attenuated given his size.  Given his complete heart block and the fact that he has CRT, the added benefit of sinus I think is even harder to know given recent data about improved outcomes in patients with complete heart block with CRT.  No bleeding.  Continue apixaban 5 mg twice daily  Volume status is stable continue  Lasix 80 mg daily  Had executive blood work the other day will bring the results by

## 2021-12-04 NOTE — Patient Instructions (Signed)

## 2021-12-05 ENCOUNTER — Ambulatory Visit (INDEPENDENT_AMBULATORY_CARE_PROVIDER_SITE_OTHER): Payer: Medicare Other

## 2021-12-05 ENCOUNTER — Encounter: Payer: Self-pay | Admitting: Internal Medicine

## 2021-12-05 DIAGNOSIS — I442 Atrioventricular block, complete: Secondary | ICD-10-CM

## 2021-12-05 LAB — CUP PACEART REMOTE DEVICE CHECK
Battery Remaining Longevity: 79 mo
Battery Voltage: 2.98 V
Brady Statistic AP VP Percent: 40.36 %
Brady Statistic AP VS Percent: 0.01 %
Brady Statistic AS VP Percent: 59.56 %
Brady Statistic AS VS Percent: 0.07 %
Brady Statistic RA Percent Paced: 40.32 %
Brady Statistic RV Percent Paced: 99.92 %
Date Time Interrogation Session: 20230215062302
Implantable Lead Implant Date: 20130416
Implantable Lead Implant Date: 20130416
Implantable Lead Implant Date: 20200826
Implantable Lead Location: 753858
Implantable Lead Location: 753859
Implantable Lead Location: 753860
Implantable Lead Model: 5076
Implantable Lead Model: 5076
Implantable Pulse Generator Implant Date: 20200826
Lead Channel Impedance Value: 1140 Ohm
Lead Channel Impedance Value: 1178 Ohm
Lead Channel Impedance Value: 1254 Ohm
Lead Channel Impedance Value: 1330 Ohm
Lead Channel Impedance Value: 1349 Ohm
Lead Channel Impedance Value: 1463 Ohm
Lead Channel Impedance Value: 323 Ohm
Lead Channel Impedance Value: 361 Ohm
Lead Channel Impedance Value: 380 Ohm
Lead Channel Impedance Value: 380 Ohm
Lead Channel Impedance Value: 627 Ohm
Lead Channel Impedance Value: 684 Ohm
Lead Channel Impedance Value: 760 Ohm
Lead Channel Impedance Value: 817 Ohm
Lead Channel Pacing Threshold Amplitude: 0.75 V
Lead Channel Pacing Threshold Amplitude: 0.875 V
Lead Channel Pacing Threshold Amplitude: 2.125 V
Lead Channel Pacing Threshold Pulse Width: 0.4 ms
Lead Channel Pacing Threshold Pulse Width: 0.4 ms
Lead Channel Pacing Threshold Pulse Width: 1 ms
Lead Channel Sensing Intrinsic Amplitude: 3.375 mV
Lead Channel Sensing Intrinsic Amplitude: 3.375 mV
Lead Channel Sensing Intrinsic Amplitude: 8.875 mV
Lead Channel Sensing Intrinsic Amplitude: 8.875 mV
Lead Channel Setting Pacing Amplitude: 1.75 V
Lead Channel Setting Pacing Amplitude: 2.5 V
Lead Channel Setting Pacing Amplitude: 2.5 V
Lead Channel Setting Pacing Pulse Width: 0.4 ms
Lead Channel Setting Pacing Pulse Width: 1 ms
Lead Channel Setting Sensing Sensitivity: 4 mV

## 2021-12-11 NOTE — Progress Notes (Signed)
Remote pacemaker transmission.   

## 2021-12-19 DIAGNOSIS — M5136 Other intervertebral disc degeneration, lumbar region: Secondary | ICD-10-CM | POA: Diagnosis not present

## 2021-12-19 DIAGNOSIS — Z6841 Body Mass Index (BMI) 40.0 and over, adult: Secondary | ICD-10-CM | POA: Diagnosis not present

## 2021-12-19 DIAGNOSIS — M545 Low back pain, unspecified: Secondary | ICD-10-CM | POA: Diagnosis not present

## 2021-12-24 ENCOUNTER — Encounter: Payer: Self-pay | Admitting: Internal Medicine

## 2022-02-13 ENCOUNTER — Ambulatory Visit (INDEPENDENT_AMBULATORY_CARE_PROVIDER_SITE_OTHER): Payer: Medicare Other

## 2022-02-13 DIAGNOSIS — I442 Atrioventricular block, complete: Secondary | ICD-10-CM | POA: Diagnosis not present

## 2022-02-14 LAB — CUP PACEART REMOTE DEVICE CHECK
Battery Remaining Longevity: 71 mo
Battery Voltage: 2.97 V
Brady Statistic AP VP Percent: 45.73 %
Brady Statistic AP VS Percent: 0.03 %
Brady Statistic AS VP Percent: 52.5 %
Brady Statistic AS VS Percent: 1.68 %
Brady Statistic RA Percent Paced: 33.56 %
Brady Statistic RV Percent Paced: 98.52 %
Date Time Interrogation Session: 20230426014551
Implantable Lead Implant Date: 20130416
Implantable Lead Implant Date: 20130416
Implantable Lead Implant Date: 20200826
Implantable Lead Location: 753858
Implantable Lead Location: 753859
Implantable Lead Location: 753860
Implantable Lead Model: 5076
Implantable Lead Model: 5076
Implantable Pulse Generator Implant Date: 20200826
Lead Channel Impedance Value: 1026 Ohm
Lead Channel Impedance Value: 1026 Ohm
Lead Channel Impedance Value: 1083 Ohm
Lead Channel Impedance Value: 1140 Ohm
Lead Channel Impedance Value: 1159 Ohm
Lead Channel Impedance Value: 1254 Ohm
Lead Channel Impedance Value: 304 Ohm
Lead Channel Impedance Value: 323 Ohm
Lead Channel Impedance Value: 342 Ohm
Lead Channel Impedance Value: 361 Ohm
Lead Channel Impedance Value: 589 Ohm
Lead Channel Impedance Value: 608 Ohm
Lead Channel Impedance Value: 684 Ohm
Lead Channel Impedance Value: 684 Ohm
Lead Channel Pacing Threshold Amplitude: 0.875 V
Lead Channel Pacing Threshold Amplitude: 1 V
Lead Channel Pacing Threshold Amplitude: 2.375 V
Lead Channel Pacing Threshold Pulse Width: 0.4 ms
Lead Channel Pacing Threshold Pulse Width: 0.4 ms
Lead Channel Pacing Threshold Pulse Width: 1 ms
Lead Channel Sensing Intrinsic Amplitude: 3 mV
Lead Channel Sensing Intrinsic Amplitude: 3 mV
Lead Channel Sensing Intrinsic Amplitude: 8 mV
Lead Channel Sensing Intrinsic Amplitude: 8 mV
Lead Channel Setting Pacing Amplitude: 2.25 V
Lead Channel Setting Pacing Amplitude: 2.5 V
Lead Channel Setting Pacing Amplitude: 2.5 V
Lead Channel Setting Pacing Pulse Width: 0.4 ms
Lead Channel Setting Pacing Pulse Width: 1 ms
Lead Channel Setting Sensing Sensitivity: 4 mV

## 2022-02-28 NOTE — Progress Notes (Signed)
Remote pacemaker transmission.   

## 2022-03-06 ENCOUNTER — Ambulatory Visit (INDEPENDENT_AMBULATORY_CARE_PROVIDER_SITE_OTHER): Payer: Medicare Other

## 2022-03-06 DIAGNOSIS — I442 Atrioventricular block, complete: Secondary | ICD-10-CM

## 2022-03-07 LAB — CUP PACEART REMOTE DEVICE CHECK
Battery Remaining Longevity: 74 mo
Battery Voltage: 2.97 V
Brady Statistic AP VP Percent: 58.49 %
Brady Statistic AP VS Percent: 0.01 %
Brady Statistic AS VP Percent: 41.46 %
Brady Statistic AS VS Percent: 0.03 %
Brady Statistic RA Percent Paced: 53.3 %
Brady Statistic RV Percent Paced: 99.89 %
Date Time Interrogation Session: 20230516205157
Implantable Lead Implant Date: 20130416
Implantable Lead Implant Date: 20130416
Implantable Lead Implant Date: 20200826
Implantable Lead Location: 753858
Implantable Lead Location: 753859
Implantable Lead Location: 753860
Implantable Lead Model: 5076
Implantable Lead Model: 5076
Implantable Pulse Generator Implant Date: 20200826
Lead Channel Impedance Value: 1064 Ohm
Lead Channel Impedance Value: 1083 Ohm
Lead Channel Impedance Value: 1159 Ohm
Lead Channel Impedance Value: 1235 Ohm
Lead Channel Impedance Value: 1254 Ohm
Lead Channel Impedance Value: 1349 Ohm
Lead Channel Impedance Value: 304 Ohm
Lead Channel Impedance Value: 342 Ohm
Lead Channel Impedance Value: 361 Ohm
Lead Channel Impedance Value: 380 Ohm
Lead Channel Impedance Value: 608 Ohm
Lead Channel Impedance Value: 646 Ohm
Lead Channel Impedance Value: 703 Ohm
Lead Channel Impedance Value: 741 Ohm
Lead Channel Pacing Threshold Amplitude: 0.875 V
Lead Channel Pacing Threshold Amplitude: 0.875 V
Lead Channel Pacing Threshold Amplitude: 2.125 V
Lead Channel Pacing Threshold Pulse Width: 0.4 ms
Lead Channel Pacing Threshold Pulse Width: 0.4 ms
Lead Channel Pacing Threshold Pulse Width: 1 ms
Lead Channel Sensing Intrinsic Amplitude: 1.625 mV
Lead Channel Sensing Intrinsic Amplitude: 1.625 mV
Lead Channel Sensing Intrinsic Amplitude: 8 mV
Lead Channel Sensing Intrinsic Amplitude: 8 mV
Lead Channel Setting Pacing Amplitude: 2 V
Lead Channel Setting Pacing Amplitude: 2.5 V
Lead Channel Setting Pacing Amplitude: 2.5 V
Lead Channel Setting Pacing Pulse Width: 0.4 ms
Lead Channel Setting Pacing Pulse Width: 1 ms
Lead Channel Setting Sensing Sensitivity: 4 mV

## 2022-03-12 ENCOUNTER — Telehealth: Payer: Self-pay

## 2022-03-12 ENCOUNTER — Encounter: Payer: Self-pay | Admitting: Internal Medicine

## 2022-03-12 NOTE — Telephone Encounter (Signed)
Transmission not received. Spoke with wife. Provided Medtronic tech support contact and encouraged wife to call for troubleshooting. Will continue to await transmission.

## 2022-03-12 NOTE — Telephone Encounter (Signed)
-----   Message from Benedetto Goad, RN sent at 03/07/2022 10:08 AM EDT ----- Regarding: requst remote 5/21 for AF

## 2022-03-12 NOTE — Telephone Encounter (Signed)
Successful telephone encounter to patients wife (on [place]) as she states patient is in a lot of sever orthopedic back pain and resting in bed at this time. Informed wife that Device RN is requesting manual transmission to reassess for ongoing AF. Known history. Controlled V rates. Wife feels AF episodes coincide with back pain. Patient to see ortho on Friday. Taking tylenol and muscle relaxer. Unfortunately carelink website is down at this time. Will continue to monitor website and assess for today's transmission. Per wife patient does not have symptoms when he is in AF and continues to take eliquis as prescribed.

## 2022-03-20 DIAGNOSIS — M546 Pain in thoracic spine: Secondary | ICD-10-CM | POA: Diagnosis not present

## 2022-03-20 DIAGNOSIS — M545 Low back pain, unspecified: Secondary | ICD-10-CM | POA: Diagnosis not present

## 2022-03-20 NOTE — Progress Notes (Signed)
Remote pacemaker transmission.   

## 2022-03-21 ENCOUNTER — Encounter: Payer: Self-pay | Admitting: Internal Medicine

## 2022-03-26 ENCOUNTER — Telehealth: Payer: Self-pay

## 2022-03-26 ENCOUNTER — Encounter: Payer: Self-pay | Admitting: Internal Medicine

## 2022-03-26 NOTE — Telephone Encounter (Signed)
Called to request manual transmission per Dr. Caryl Comes. Spoke to patients wife William Woods who is on [place]. States she will send one over shortly. Advised once received, will send it to Dr. Caryl Comes for review. Appreciate of call.

## 2022-03-26 NOTE — Telephone Encounter (Signed)
-----   Message from Deboraha Sprang, MD sent at 03/25/2022  7:27 PM EDT ----- Remote reviewed. This remote is abnormal for atrial fib   plan was to retransmit,  not done  can we try again please . Thanks SK

## 2022-03-26 NOTE — Telephone Encounter (Signed)
Patient is back in normal rhythm. Routing to Dr. Caryl Comes for update.

## 2022-03-26 NOTE — Telephone Encounter (Signed)
Reviewed in office with Dr. Caryl Comes- no new recommendations.  Patient/ his wife notified through [place].

## 2022-05-15 ENCOUNTER — Ambulatory Visit (INDEPENDENT_AMBULATORY_CARE_PROVIDER_SITE_OTHER): Payer: Medicare Other

## 2022-05-15 DIAGNOSIS — I442 Atrioventricular block, complete: Secondary | ICD-10-CM

## 2022-05-15 LAB — CUP PACEART REMOTE DEVICE CHECK
Battery Remaining Longevity: 72 mo
Battery Voltage: 2.97 V
Brady Statistic AP VP Percent: 31.12 %
Brady Statistic AP VS Percent: 0.02 %
Brady Statistic AS VP Percent: 68.09 %
Brady Statistic AS VS Percent: 0.66 %
Brady Statistic RA Percent Paced: 20.2 %
Brady Statistic RV Percent Paced: 99.19 %
Date Time Interrogation Session: 20230726061924
Implantable Lead Implant Date: 20130416
Implantable Lead Implant Date: 20130416
Implantable Lead Implant Date: 20200826
Implantable Lead Location: 753858
Implantable Lead Location: 753859
Implantable Lead Location: 753860
Implantable Lead Model: 5076
Implantable Lead Model: 5076
Implantable Pulse Generator Implant Date: 20200826
Lead Channel Impedance Value: 1007 Ohm
Lead Channel Impedance Value: 1083 Ohm
Lead Channel Impedance Value: 1140 Ohm
Lead Channel Impedance Value: 1235 Ohm
Lead Channel Impedance Value: 1292 Ohm
Lead Channel Impedance Value: 1349 Ohm
Lead Channel Impedance Value: 323 Ohm
Lead Channel Impedance Value: 361 Ohm
Lead Channel Impedance Value: 361 Ohm
Lead Channel Impedance Value: 380 Ohm
Lead Channel Impedance Value: 627 Ohm
Lead Channel Impedance Value: 646 Ohm
Lead Channel Impedance Value: 684 Ohm
Lead Channel Impedance Value: 798 Ohm
Lead Channel Pacing Threshold Amplitude: 0.875 V
Lead Channel Pacing Threshold Amplitude: 1 V
Lead Channel Pacing Threshold Amplitude: 2.25 V
Lead Channel Pacing Threshold Pulse Width: 0.4 ms
Lead Channel Pacing Threshold Pulse Width: 0.4 ms
Lead Channel Pacing Threshold Pulse Width: 1 ms
Lead Channel Sensing Intrinsic Amplitude: 3.125 mV
Lead Channel Sensing Intrinsic Amplitude: 3.125 mV
Lead Channel Sensing Intrinsic Amplitude: 8 mV
Lead Channel Sensing Intrinsic Amplitude: 8 mV
Lead Channel Setting Pacing Amplitude: 2 V
Lead Channel Setting Pacing Amplitude: 2.5 V
Lead Channel Setting Pacing Amplitude: 2.5 V
Lead Channel Setting Pacing Pulse Width: 0.4 ms
Lead Channel Setting Pacing Pulse Width: 1 ms
Lead Channel Setting Sensing Sensitivity: 4 mV

## 2022-06-05 ENCOUNTER — Ambulatory Visit (INDEPENDENT_AMBULATORY_CARE_PROVIDER_SITE_OTHER): Payer: Medicare Other

## 2022-06-05 DIAGNOSIS — I442 Atrioventricular block, complete: Secondary | ICD-10-CM | POA: Diagnosis not present

## 2022-06-05 LAB — CUP PACEART REMOTE DEVICE CHECK
Battery Remaining Longevity: 70 mo
Battery Voltage: 2.97 V
Brady Statistic AP VP Percent: 25.17 %
Brady Statistic AP VS Percent: 0.01 %
Brady Statistic AS VP Percent: 74.51 %
Brady Statistic AS VS Percent: 0.16 %
Brady Statistic RA Percent Paced: 14.67 %
Brady Statistic RV Percent Paced: 99.82 %
Date Time Interrogation Session: 20230816073742
Implantable Lead Implant Date: 20130416
Implantable Lead Implant Date: 20130416
Implantable Lead Implant Date: 20200826
Implantable Lead Location: 753858
Implantable Lead Location: 753859
Implantable Lead Location: 753860
Implantable Lead Model: 5076
Implantable Lead Model: 5076
Implantable Pulse Generator Implant Date: 20200826
Lead Channel Impedance Value: 1064 Ohm
Lead Channel Impedance Value: 1083 Ohm
Lead Channel Impedance Value: 1121 Ohm
Lead Channel Impedance Value: 1216 Ohm
Lead Channel Impedance Value: 1254 Ohm
Lead Channel Impedance Value: 1330 Ohm
Lead Channel Impedance Value: 304 Ohm
Lead Channel Impedance Value: 342 Ohm
Lead Channel Impedance Value: 361 Ohm
Lead Channel Impedance Value: 380 Ohm
Lead Channel Impedance Value: 608 Ohm
Lead Channel Impedance Value: 646 Ohm
Lead Channel Impedance Value: 703 Ohm
Lead Channel Impedance Value: 741 Ohm
Lead Channel Pacing Threshold Amplitude: 0.875 V
Lead Channel Pacing Threshold Amplitude: 1.125 V
Lead Channel Pacing Threshold Amplitude: 2.25 V
Lead Channel Pacing Threshold Pulse Width: 0.4 ms
Lead Channel Pacing Threshold Pulse Width: 0.4 ms
Lead Channel Pacing Threshold Pulse Width: 1 ms
Lead Channel Sensing Intrinsic Amplitude: 3.625 mV
Lead Channel Sensing Intrinsic Amplitude: 3.625 mV
Lead Channel Sensing Intrinsic Amplitude: 8 mV
Lead Channel Sensing Intrinsic Amplitude: 8 mV
Lead Channel Setting Pacing Amplitude: 2.25 V
Lead Channel Setting Pacing Amplitude: 2.5 V
Lead Channel Setting Pacing Amplitude: 2.5 V
Lead Channel Setting Pacing Pulse Width: 0.4 ms
Lead Channel Setting Pacing Pulse Width: 1 ms
Lead Channel Setting Sensing Sensitivity: 4 mV

## 2022-06-10 NOTE — Progress Notes (Signed)
Remote pacemaker transmission.   

## 2022-06-25 DIAGNOSIS — M5136 Other intervertebral disc degeneration, lumbar region: Secondary | ICD-10-CM | POA: Diagnosis not present

## 2022-06-25 DIAGNOSIS — M4810 Ankylosing hyperostosis [Forestier], site unspecified: Secondary | ICD-10-CM | POA: Diagnosis not present

## 2022-06-25 DIAGNOSIS — E669 Obesity, unspecified: Secondary | ICD-10-CM | POA: Diagnosis not present

## 2022-06-25 DIAGNOSIS — Z6839 Body mass index (BMI) 39.0-39.9, adult: Secondary | ICD-10-CM | POA: Diagnosis not present

## 2022-07-04 NOTE — Progress Notes (Signed)
Remote pacemaker transmission.   

## 2022-08-01 ENCOUNTER — Ambulatory Visit: Payer: Medicare Other | Admitting: Internal Medicine

## 2022-08-07 ENCOUNTER — Encounter: Payer: Self-pay | Admitting: Internal Medicine

## 2022-08-14 ENCOUNTER — Ambulatory Visit (INDEPENDENT_AMBULATORY_CARE_PROVIDER_SITE_OTHER): Payer: Medicare Other

## 2022-08-14 DIAGNOSIS — I442 Atrioventricular block, complete: Secondary | ICD-10-CM | POA: Diagnosis not present

## 2022-08-15 LAB — CUP PACEART REMOTE DEVICE CHECK
Battery Remaining Longevity: 68 mo
Battery Voltage: 2.96 V
Brady Statistic AP VP Percent: 33.3 %
Brady Statistic AP VS Percent: 0.02 %
Brady Statistic AS VP Percent: 65.56 %
Brady Statistic AS VS Percent: 0.98 %
Brady Statistic RA Percent Paced: 18.68 %
Brady Statistic RV Percent Paced: 98.45 %
Date Time Interrogation Session: 20231025031428
Implantable Lead Connection Status: 753985
Implantable Lead Connection Status: 753985
Implantable Lead Connection Status: 753985
Implantable Lead Implant Date: 20130416
Implantable Lead Implant Date: 20130416
Implantable Lead Implant Date: 20200826
Implantable Lead Location: 753858
Implantable Lead Location: 753859
Implantable Lead Location: 753860
Implantable Lead Model: 5076
Implantable Lead Model: 5076
Implantable Pulse Generator Implant Date: 20200826
Lead Channel Impedance Value: 1045 Ohm
Lead Channel Impedance Value: 1140 Ohm
Lead Channel Impedance Value: 1178 Ohm
Lead Channel Impedance Value: 1292 Ohm
Lead Channel Impedance Value: 1311 Ohm
Lead Channel Impedance Value: 1406 Ohm
Lead Channel Impedance Value: 323 Ohm
Lead Channel Impedance Value: 342 Ohm
Lead Channel Impedance Value: 380 Ohm
Lead Channel Impedance Value: 380 Ohm
Lead Channel Impedance Value: 608 Ohm
Lead Channel Impedance Value: 665 Ohm
Lead Channel Impedance Value: 722 Ohm
Lead Channel Impedance Value: 836 Ohm
Lead Channel Pacing Threshold Amplitude: 0.75 V
Lead Channel Pacing Threshold Amplitude: 1 V
Lead Channel Pacing Threshold Amplitude: 2.125 V
Lead Channel Pacing Threshold Pulse Width: 0.4 ms
Lead Channel Pacing Threshold Pulse Width: 0.4 ms
Lead Channel Pacing Threshold Pulse Width: 1 ms
Lead Channel Sensing Intrinsic Amplitude: 4.625 mV
Lead Channel Sensing Intrinsic Amplitude: 4.625 mV
Lead Channel Sensing Intrinsic Amplitude: 8 mV
Lead Channel Sensing Intrinsic Amplitude: 8 mV
Lead Channel Setting Pacing Amplitude: 2 V
Lead Channel Setting Pacing Amplitude: 2.5 V
Lead Channel Setting Pacing Amplitude: 2.5 V
Lead Channel Setting Pacing Pulse Width: 0.4 ms
Lead Channel Setting Pacing Pulse Width: 1 ms
Lead Channel Setting Sensing Sensitivity: 4 mV
Zone Setting Status: 755011
Zone Setting Status: 755011

## 2022-08-19 ENCOUNTER — Other Ambulatory Visit: Payer: Self-pay | Admitting: Internal Medicine

## 2022-08-19 DIAGNOSIS — I480 Paroxysmal atrial fibrillation: Secondary | ICD-10-CM

## 2022-08-19 NOTE — Telephone Encounter (Signed)
Eliquis '5mg'$  refill request received. Patient is 76 years old, weight-133.8kg, Crea-1.31 on 09/06/2021 via [employer], [place], and last seen by Dr. Caryl Comes on 12/04/2021. Dose is appropriate based on dosing criteria. Will send in refill to requested pharmacy.

## 2022-08-27 NOTE — Progress Notes (Signed)
Remote pacemaker transmission.   

## 2022-09-04 ENCOUNTER — Ambulatory Visit (INDEPENDENT_AMBULATORY_CARE_PROVIDER_SITE_OTHER): Payer: Medicare Other

## 2022-09-04 DIAGNOSIS — I442 Atrioventricular block, complete: Secondary | ICD-10-CM

## 2022-09-05 LAB — CUP PACEART REMOTE DEVICE CHECK
Battery Remaining Longevity: 70 mo
Battery Voltage: 2.96 V
Brady Statistic AP VP Percent: 20.13 %
Brady Statistic AP VS Percent: 0.01 %
Brady Statistic AS VP Percent: 78.54 %
Brady Statistic AS VS Percent: 1.14 %
Brady Statistic RA Percent Paced: 10.95 %
Brady Statistic RV Percent Paced: 97.61 %
Date Time Interrogation Session: 20231116104911
Implantable Lead Connection Status: 753985
Implantable Lead Connection Status: 753985
Implantable Lead Connection Status: 753985
Implantable Lead Implant Date: 20130416
Implantable Lead Implant Date: 20130416
Implantable Lead Implant Date: 20200826
Implantable Lead Location: 753858
Implantable Lead Location: 753859
Implantable Lead Location: 753860
Implantable Lead Model: 5076
Implantable Lead Model: 5076
Implantable Pulse Generator Implant Date: 20200826
Lead Channel Impedance Value: 1007 Ohm
Lead Channel Impedance Value: 1235 Ohm
Lead Channel Impedance Value: 1273 Ohm
Lead Channel Impedance Value: 1311 Ohm
Lead Channel Impedance Value: 1539 Ohm
Lead Channel Impedance Value: 1558 Ohm
Lead Channel Impedance Value: 1653 Ohm
Lead Channel Impedance Value: 342 Ohm
Lead Channel Impedance Value: 399 Ohm
Lead Channel Impedance Value: 399 Ohm
Lead Channel Impedance Value: 437 Ohm
Lead Channel Impedance Value: 722 Ohm
Lead Channel Impedance Value: 741 Ohm
Lead Channel Impedance Value: 836 Ohm
Lead Channel Pacing Threshold Amplitude: 0.75 V
Lead Channel Pacing Threshold Amplitude: 1 V
Lead Channel Pacing Threshold Amplitude: 2.125 V
Lead Channel Pacing Threshold Pulse Width: 0.4 ms
Lead Channel Pacing Threshold Pulse Width: 0.4 ms
Lead Channel Pacing Threshold Pulse Width: 1 ms
Lead Channel Sensing Intrinsic Amplitude: 5 mV
Lead Channel Sensing Intrinsic Amplitude: 5 mV
Lead Channel Sensing Intrinsic Amplitude: 8 mV
Lead Channel Sensing Intrinsic Amplitude: 8 mV
Lead Channel Setting Pacing Amplitude: 2.25 V
Lead Channel Setting Pacing Amplitude: 2.5 V
Lead Channel Setting Pacing Amplitude: 2.5 V
Lead Channel Setting Pacing Pulse Width: 0.4 ms
Lead Channel Setting Pacing Pulse Width: 1 ms
Lead Channel Setting Sensing Sensitivity: 4 mV
Zone Setting Status: 755011
Zone Setting Status: 755011

## 2022-09-26 NOTE — Progress Notes (Signed)
Remote pacemaker transmission.   

## 2022-09-27 ENCOUNTER — Telehealth: Payer: Self-pay

## 2022-09-27 NOTE — Telephone Encounter (Signed)
ATC patient to request that he bring SD card. Unable to leave vm due to mailbox not being setup.

## 2022-09-30 ENCOUNTER — Ambulatory Visit (INDEPENDENT_AMBULATORY_CARE_PROVIDER_SITE_OTHER): Payer: Medicare Other | Admitting: Internal Medicine

## 2022-09-30 ENCOUNTER — Encounter: Payer: Self-pay | Admitting: Internal Medicine

## 2022-09-30 VITALS — BP 132/82 | HR 89 | Temp 97.8°F | Ht 72.0 in | Wt 288.4 lb

## 2022-09-30 DIAGNOSIS — G4733 Obstructive sleep apnea (adult) (pediatric): Secondary | ICD-10-CM | POA: Diagnosis not present

## 2022-09-30 DIAGNOSIS — Z6841 Body Mass Index (BMI) 40.0 and over, adult: Secondary | ICD-10-CM | POA: Diagnosis not present

## 2022-09-30 NOTE — Telephone Encounter (Signed)
Spoke to patient and requested that he bring SD card. Nothing further needed/

## 2022-09-30 NOTE — Progress Notes (Signed)
$'@Patient'j$  ID: William Woods., male    DOB: 1946/07/20, 76 y.o.   MRN: [id number]  CC  follow up OSA   TEST/EVENTS :  11/26/2012-sleep study-AHI 37.43    HPI: 76 year old male former smoker followed for severe obstructive sleep apnea Medical history significant for congestive heart failure.   09/30/2022 Follow up : OSA  Patient has underlying severe obstructive sleep apnea is maintained on nocturnal CPAP.  Patient says he is doing well on CPAP.    He wears a CPAP every single night.   Patient says he does have 2 CPAP machines.  When he goes to the beach he has a second machine that he uses for travel.    Patient has excellent compliance for the last 2 weeks however in the first part of the month patient has been noncompliant due to back pain  Patients wife suggested that that he moved to a recliner and set up his CPAP machine Ever since then patient has had excellent control of his sleep apnea AHI reduced to 5.4  At this time patient absolutely needs CPAP therapy for survival Patient asking about inspire device Information provided  No exacerbation at this time No evidence of heart failure at this time No evidence or signs of infection at this time No respiratory distress No fevers, chills, nausea, vomiting, diarrhea No evidence of lower extremity edema No evidence hemoptysis    Allergies  Allergen Reactions   Other     LONG QT SYNDROME--MANY DRUGS PATIENT HAS TO AVOID SEE WEBSITE FOR DETAILS Http://www.sads.org.uk/drugs_to_avoid.htm     Immunization History  Administered Date(s) Administered   PFIZER(Purple Top)SARS-COV-2 Vaccination 12/17/2019, 01/11/2020    Past Medical History:  Diagnosis Date   CHB (complete heart block) (HCC)    Edema    Suspect diastolic heart failure   Hypertension    Long Q-T syndrome    Obesity (BMI 30-39.9)    Obstructive sleep apnea    Pacemaker    Medtronic Adapta L model ADDRL 1 (serial number [id number]) pacemaker.    Syncope    Thyroid disease     Tobacco History: Social History   Tobacco Use  Smoking Status Former   Packs/day: 4.00   Years: 25.00   Total pack years: 100.00   Types: Cigarettes   Quit date: 02/06/1988   Years since quitting: 34.6  Smokeless Tobacco Former   Counseling given: Not Answered    Outpatient Medications Prior to Visit  Medication Sig Dispense Refill   Ascorbic Acid (VITAMIN C) 1000 MG tablet Take 1,000 mg by mouth 2 (two) times daily.     Coenzyme Q10 (CO Q-10 PO) Take by mouth.     ELIQUIS 5 MG TABS tablet Take 1 tablet (5 mg total) by mouth 2 (two) times daily. 180 tablet 0   furosemide (LASIX) 80 MG tablet Take 1 tablet (80 mg total) by mouth daily. 90 tablet 3   losartan (COZAAR) 25 MG tablet Take 1 tablet (25 mg total) by mouth daily. 90 tablet 3   MAGNESIUM PO Take by mouth.     meloxicam (MOBIC) 15 MG tablet Take 15 mg by mouth daily.     metoprolol succinate (TOPROL-XL) 25 MG 24 hr tablet Take 1 tablet (25 mg total) by mouth daily. 90 tablet 3   OVER THE COUNTER MEDICATION Cherry extract daily     spironolactone (ALDACTONE) 25 MG tablet Take 0.5 tablet (12.5 mg) by mouth once daily. 45 tablet 3   No facility-administered medications prior  to visit.        Lab Results:  CBC    Component Value Date/Time   WBC 7.0 05/29/2021 1122   WBC 9.4 05/22/2020 0935   RBC 5.56 05/29/2021 1122   RBC 5.71 05/22/2020 0935   HGB 14.8 05/29/2021 1122   HCT 45.2 05/29/2021 1122   PLT 128 (L) 05/29/2021 1122   MCV 81 05/29/2021 1122   MCV 81 01/20/2012 0311   MCH 26.6 05/29/2021 1122   MCH 26.8 05/22/2020 0935   MCHC 32.7 05/29/2021 1122   MCHC 31.7 05/22/2020 0935   RDW 12.7 05/29/2021 1122   RDW 13.9 01/20/2012 0311   LYMPHSABS 1.5 05/29/2021 1122   LYMPHSABS 1.9 01/20/2012 0311   MONOABS 0.8 05/22/2020 0935   MONOABS 0.6 01/20/2012 0311   EOSABS 0.1 05/29/2021 1122   EOSABS 0.1 01/20/2012 0311   BASOSABS 0.0 05/29/2021 1122   BASOSABS 0.0  01/20/2012 0311    BMET    Component Value Date/Time   NA 142 08/23/2021 1132   NA 143 01/20/2012 0311   K 4.5 08/23/2021 1132   K 4.1 01/20/2012 0311   CL 102 08/23/2021 1132   CL 107 01/20/2012 0311   CO2 26 08/23/2021 1132   CO2 30 01/20/2012 0311   GLUCOSE 97 08/23/2021 1132   GLUCOSE 114 (H) 05/22/2020 0935   GLUCOSE 103 (H) 01/20/2012 0311   BUN 22 08/23/2021 1132   BUN 21 (H) 01/20/2012 0311   CREATININE 1.16 08/23/2021 1132   CREATININE 1.13 09/23/2017 0935   CALCIUM 9.2 08/23/2021 1132   CALCIUM 8.5 01/20/2012 0311   GFRNONAA >60 05/22/2020 0935   GFRNONAA 66 09/23/2017 0935   GFRAA >60 05/22/2020 0935   GFRAA 76 09/23/2017 0935    BNP    Component Value Date/Time   BNP 374.0 (H) 04/22/2019 1307    ProBNP    Component Value Date/Time   PROBNP 743.6 (H) 02/04/2012 1632    Imaging: CUP PACEART REMOTE DEVICE CHECK  Result Date: 09/05/2022 Scheduled remote reviewed. Normal device function.  379 AF events, controlled rates Burden 46.7%, stable, Eliquis Next remote 91 days. LA      Review of Systems: Gen:  Denies  fever, sweats, chills weight loss  HEENT: Denies blurred vision, double vision, ear pain, eye pain, hearing loss, nose bleeds, sore throat Cardiac:  No dizziness, chest pain or heaviness, chest tightness,edema, No JVD Resp:   No cough, -sputum production, -shortness of breath,-wheezing, -hemoptysis,  Other:  All other systems negative    Physical Examination:   General Appearance: No distress  EYES PERRLA, EOM intact.   NECK Supple, No JVD Pulmonary: normal breath sounds, No wheezing.  CardiovascularNormal S1,S2.  No m/r/g.   Abdomen: Benign, Soft, non-tender. ALL OTHER ROS ARE NEGATIVE     Assessment & Plan:    76 year old white male morbidly obese with severe underlying sleep apnea  Severe sleep apnea Patient needs CPAP therapy to stay alive Patient has been very compliant for the last 2 weeks Previous AHI is approximately  40 now reduced to 5.4 with auto CPAP therapy 5 to 20 cm of water pressure Patient uses and benefits from CPAP therapy   Obesity -recommend significant weight loss -recommend changing diet  Deconditioned state -Recommend increased daily activity and exercise   Hypertension - Sleep apnea can contribute to hypertension, therefore treatment of sleep apnea is important part of hypertension management.  Atrial Fibrillation - Sleep apnea can contribute to Atrial Fibrillation, therefore treatment of sleep apnea  is important part of A fib management.    MEDICATION ADJUSTMENTS/LABS AND TESTS ORDERED:  Continue CPAP as prescribed-THIS WILL SAVE YOUR LIFE!!! You did GREAT LAST 14 days!! Keep up the great work!! Avoid secondhand smoke Avoid SICK contacts Please Mask when appropriate Please Keep up-to-date with vaccinations  CURRENT MEDICATIONS REVIEWED AT LENGTH WITH PATIENT TODAY   Patient  satisfied with Plan of action and management. All questions answered  Follow up  6 months  Total Time Spent  35 mins   Maretta Bees Patricia Pesa, M.D.  [facility]  Medical Director [place] Director [facility] Cardio-Pulmonary Department

## 2022-09-30 NOTE — Patient Instructions (Addendum)
Continue CPAP as prescribed-THIS WILL SAVE YOUR LIFE!!! You did GREAT LAST 14 days!! Keep up the great work!!    Avoid secondhand smoke Avoid SICK contacts Please Mask when appropriate Please Keep up-to-date with vaccinations

## 2022-11-25 ENCOUNTER — Other Ambulatory Visit: Payer: Self-pay | Admitting: Internal Medicine

## 2022-11-25 DIAGNOSIS — I480 Paroxysmal atrial fibrillation: Secondary | ICD-10-CM

## 2022-11-25 NOTE — Telephone Encounter (Signed)
Prescription refill request for Eliquis received. Indication:afib Last office visit:2/23 Scr:1.1  KPN Age: 77 Weight:130.8  kg  Prescription refilled

## 2022-12-06 ENCOUNTER — Ambulatory Visit (INDEPENDENT_AMBULATORY_CARE_PROVIDER_SITE_OTHER): Payer: Medicare Other

## 2022-12-06 DIAGNOSIS — I442 Atrioventricular block, complete: Secondary | ICD-10-CM

## 2022-12-09 LAB — CUP PACEART REMOTE DEVICE CHECK
Battery Remaining Longevity: 61 mo
Battery Voltage: 2.96 V
Brady Statistic AP VP Percent: 26.91 %
Brady Statistic AP VS Percent: 0.02 %
Brady Statistic AS VP Percent: 72.34 %
Brady Statistic AS VS Percent: 0.52 %
Brady Statistic RA Percent Paced: 9.97 %
Brady Statistic RV Percent Paced: 98.9 %
Date Time Interrogation Session: 20240216083854
Implantable Lead Connection Status: 753985
Implantable Lead Connection Status: 753985
Implantable Lead Connection Status: 753985
Implantable Lead Implant Date: 20130416
Implantable Lead Implant Date: 20130416
Implantable Lead Implant Date: 20200826
Implantable Lead Location: 753858
Implantable Lead Location: 753859
Implantable Lead Location: 753860
Implantable Lead Model: 5076
Implantable Lead Model: 5076
Implantable Pulse Generator Implant Date: 20200826
Lead Channel Impedance Value: 1026 Ohm
Lead Channel Impedance Value: 1064 Ohm
Lead Channel Impedance Value: 1178 Ohm
Lead Channel Impedance Value: 1216 Ohm
Lead Channel Impedance Value: 1273 Ohm
Lead Channel Impedance Value: 304 Ohm
Lead Channel Impedance Value: 323 Ohm
Lead Channel Impedance Value: 361 Ohm
Lead Channel Impedance Value: 361 Ohm
Lead Channel Impedance Value: 551 Ohm
Lead Channel Impedance Value: 608 Ohm
Lead Channel Impedance Value: 646 Ohm
Lead Channel Impedance Value: 760 Ohm
Lead Channel Impedance Value: 969 Ohm
Lead Channel Pacing Threshold Amplitude: 0.875 V
Lead Channel Pacing Threshold Amplitude: 1.125 V
Lead Channel Pacing Threshold Amplitude: 2.25 V
Lead Channel Pacing Threshold Pulse Width: 0.4 ms
Lead Channel Pacing Threshold Pulse Width: 0.4 ms
Lead Channel Pacing Threshold Pulse Width: 1 ms
Lead Channel Sensing Intrinsic Amplitude: 1.5 mV
Lead Channel Sensing Intrinsic Amplitude: 1.5 mV
Lead Channel Sensing Intrinsic Amplitude: 8 mV
Lead Channel Sensing Intrinsic Amplitude: 8 mV
Lead Channel Setting Pacing Amplitude: 2.25 V
Lead Channel Setting Pacing Amplitude: 2.5 V
Lead Channel Setting Pacing Amplitude: 2.5 V
Lead Channel Setting Pacing Pulse Width: 0.4 ms
Lead Channel Setting Pacing Pulse Width: 1 ms
Lead Channel Setting Sensing Sensitivity: 4 mV
Zone Setting Status: 755011
Zone Setting Status: 755011

## 2022-12-23 ENCOUNTER — Other Ambulatory Visit: Payer: Self-pay | Admitting: Internal Medicine

## 2022-12-31 NOTE — Progress Notes (Signed)
Remote pacemaker transmission.   

## 2023-01-13 ENCOUNTER — Other Ambulatory Visit: Payer: Self-pay | Admitting: Internal Medicine

## 2023-01-27 DIAGNOSIS — I429 Cardiomyopathy, unspecified: Secondary | ICD-10-CM | POA: Insufficient documentation

## 2023-01-30 ENCOUNTER — Ambulatory Visit: Payer: Medicare Other | Attending: Internal Medicine | Admitting: Internal Medicine

## 2023-01-30 ENCOUNTER — Other Ambulatory Visit
Admission: RE | Admit: 2023-01-30 | Discharge: 2023-01-30 | Disposition: A | Discharge status: Home / Self Care | Payer: Medicare Other | Source: Ambulatory Visit | Attending: Internal Medicine | Admitting: Internal Medicine

## 2023-01-30 ENCOUNTER — Encounter: Payer: Self-pay | Admitting: Internal Medicine

## 2023-01-30 VITALS — BP 108/66 | HR 76 | Ht 72.0 in | Wt 293.4 lb

## 2023-01-30 DIAGNOSIS — Z950 Presence of cardiac pacemaker: Secondary | ICD-10-CM | POA: Diagnosis not present

## 2023-01-30 DIAGNOSIS — I442 Atrioventricular block, complete: Secondary | ICD-10-CM

## 2023-01-30 DIAGNOSIS — I5020 Unspecified systolic (congestive) heart failure: Secondary | ICD-10-CM | POA: Diagnosis not present

## 2023-01-30 DIAGNOSIS — I429 Cardiomyopathy, unspecified: Secondary | ICD-10-CM

## 2023-01-30 DIAGNOSIS — I4581 Long QT syndrome: Secondary | ICD-10-CM | POA: Diagnosis not present

## 2023-01-30 DIAGNOSIS — I480 Paroxysmal atrial fibrillation: Secondary | ICD-10-CM | POA: Diagnosis not present

## 2023-01-30 LAB — CBC
HCT: 44.1 % (ref 39.0–52.0)
Hemoglobin: 13.7 g/dL (ref 13.0–17.0)
MCH: 26.1 pg (ref 26.0–34.0)
MCHC: 31.1 g/dL (ref 30.0–36.0)
MCV: 84 fL (ref 80.0–100.0)
Platelets: 155 10*3/uL (ref 150–400)
RBC: 5.25 MIL/uL (ref 4.22–5.81)
RDW: 14.3 % (ref 11.5–15.5)
WBC: 9.6 10*3/uL (ref 4.0–10.5)
nRBC: 0 % (ref 0.0–0.2)

## 2023-01-30 LAB — BASIC METABOLIC PANEL
Anion gap: 8 (ref 5–15)
BUN: 26 mg/dL — ABNORMAL HIGH (ref 8–23)
CO2: 29 mmol/L (ref 22–32)
Calcium: 8.7 mg/dL — ABNORMAL LOW (ref 8.9–10.3)
Chloride: 100 mmol/L (ref 98–111)
Creatinine, Ser: 1.05 mg/dL (ref 0.61–1.24)
GFR, Estimated: >60 mL/min (ref >60)
Glucose, Bld: 102 mg/dL — ABNORMAL HIGH (ref 70–99)
Potassium: 3.9 mmol/L (ref 3.5–5.1)
Sodium: 137 mmol/L (ref 135–145)

## 2023-01-30 MED ORDER — SPIRONOLACTONE 25 MG PO TABS: 12.5000 mg | ORAL_TABLET | Freq: Every day | ORAL | 3 refills | Status: DC

## 2023-01-30 MED ORDER — LOSARTAN POTASSIUM 25 MG PO TABS: 25.0000 mg | ORAL_TABLET | Freq: Every day | ORAL | 1 refills | Status: DC

## 2023-01-30 NOTE — Progress Notes (Signed)
Patient ID: William Woods., male   DOB: 06-23-1946, 77 y.o.   MRN: [id number]      Patient Care Team: William Cords, DO as PCP - General (Family Medicine) William Salvia, MD as PCP - Electrophysiology (Cardiology) William Salvia, MD as PCP - Cardiology (Cardiology)   HPI  William Apostle. is a 77 y.o. male Seen for chief complaint for Medtronic pacemaker implanted for symptomatic intermittent complete heart block 2013.   CRT upgrade 8/20  Atrial fibrillation-persistent on Eliquis non  bleeding   History of syncope with a prolonged QT interval and he had undergone loop recorder insertion. With recurrent syncope heart block was identified.   Treated with beta blockers for hypertension and long QT,  stopped this and is now on losartan   Diagnosed with Sleep apnea and has been improved with CPAP    The patient denies chest pain, nocturnal dyspnea, There have been no palpitations, lightheadedness or syncope.  Complains of fatigue and some edema   sleeping in a chair   DATE TEST EF   2006 LHC 60-65 %   7/20 Echo   20-25 %   3/21 Echo  25-30%   9/22 Echo  40-45%       Date Cr K Hgb  12/18 1.13 4.3 14.5   2/20 1.14 4.3 14.1  8/20 1.13 4.2 14.6  8/21 1.19 4.1 15.3  3/22 1.09 4.5 15.7  11/22 1.16<<1.35 4.5 14.8 (8/22)         Thromboembolic risk factors ( age -25 , HTN-1, DM-1, CHF-1) for a CHADSVASc Score of >=4   He has  told me of the death of his two sons, one from cancer and one from cocaine overdose  And Gods calling him back via "God so Loved that he Gave..." Works now with  [employer]    Past Medical History:  Diagnosis Date   CHB (complete heart block)    Edema    Suspect diastolic heart failure   Hypertension    Long Q-T syndrome    Obesity (BMI 30-39.9)    Obstructive sleep apnea    Pacemaker    Medtronic Adapta L model ADDRL 1 (serial number [id number]) pacemaker.   Syncope    Thyroid disease     Past Surgical History:   Procedure Laterality Date   BIV UPGRADE N/A 06/16/2019   Procedure: BIVP UPGRADE - BIVPacemake Upgrade;  Surgeon: William Salvia, MD;  Location[facility] INVASIVE CV LAB;  Service: Cardiovascular;  Laterality: N/A;   CARDIAC CATHETERIZATION     [facility]    CARDIAC CATHETERIZATION  01/2012   [facility]: no significant CAD, EF 40%.    LOOP RECORDER EXPLANT N/A 02/04/2012   Procedure: LOOP RECORDER EXPLANT;  Surgeon: William Range, MD;  Location: [facility] CATH LAB;  Service: Cardiovascular;  Laterality: N/A;   LOOP RECORDER IMPLANT N/A 01/21/2012   Procedure: LOOP RECORDER IMPLANT;  Surgeon: William Salvia, MD;  Location: [facility] CATH LAB;  Service: Cardiovascular;  Laterality: N/A;   OTHER SURGICAL HISTORY  02/04/2012    removal of a previously implanted MDT Reveal XT implantable loop recorder   PACEMAKER INSERTION  02/04/2012   Medtronic Adapta L model ADDRL 1 (serial number [id number]) pacemaker.   PERMANENT PACEMAKER INSERTION N/A 02/04/2012   Procedure: PERMANENT PACEMAKER INSERTION;  Surgeon: William Range, MD;  Location: [facility] CATH LAB;  Service: Cardiovascular;  Laterality: N/A;    Current Outpatient Medications  Medication Sig Dispense Refill  apixaban (ELIQUIS) 5 MG TABS tablet Take 1 tablet (5 mg total) by mouth 2 (two) times daily. 60 tablet 5   Ascorbic Acid (VITAMIN C) 1000 MG tablet Take 1,000 mg by mouth 2 (two) times daily.     Coenzyme Q10 (CO Q-10 PO) Take by mouth.     furosemide (LASIX) 80 MG tablet Take 1 tablet (80 mg total) by mouth daily. (Patient taking differently: Take 80 mg by mouth daily. Except Sunday) 90 tablet 3   losartan (COZAAR) 25 MG tablet Take 1 tablet (25 mg total) by mouth daily. 90 tablet 0   MAGNESIUM PO Take by mouth.     meloxicam (MOBIC) 15 MG tablet Take 15 mg by mouth daily.     metoprolol succinate (TOPROL-XL) 25 MG 24 hr tablet Take 1 tablet (25 mg total) by mouth daily. 90 tablet 3   OVER THE COUNTER MEDICATION Cherry extract daily     spironolactone (ALDACTONE) 25 MG  tablet Take 0.5 tablet (12.5 mg) by mouth once daily. 15 tablet 0   No current facility-administered medications for this visit.    Allergies  Allergen Reactions   Other     LONG QT SYNDROME--MANY DRUGS PATIENT HAS TO AVOID SEE WEBSITE FOR DETAILS Http://www.sads.org.uk/drugs_to_avoid.htm     Review of Systems negative except from HPI and PMH  Physical Exam  BP 108/66   Pulse 76   Ht 6' (1.829 m)   Wt 293 lb 6.4 oz (133.1 kg)   SpO2 95%   BMI 39.79 kg/m  Well developed and well nourished in no acute distress HENT normal Neck supple 8 +hjr Clear Device pocket well healed; without hematoma or erythema.  There is no tethering  Regular rate and rhythm, no  gallop No  murmur Abd-soft with active BS No Clubbing cyanosis 1+ edema Skin-warm and dry A & Oriented  Grossly normal sensory and motor function  ECG ATRIAL fib flutter  With ventricular pacing occasional PVC   Device function is normal. Programming changes none  See Paceart for details    Assessment and  Plan  Complete Heart block    LONG QT syndrome ( Dx [facility] )   Cardiomyopathy-new  Congestive heart failure-chronic systolic  Pacemaker--CRT-D Medtronic     .    OSA       HTN  Atrial fibrillation-paroxysmal   Blood pressure is well-controlled.  Will continue losartan, metoprolol and spironolactone.  Atrial fibrillation/flutter remains paroxysmal but the burden remains paroxysmal but the burden is about 50-60%.  He has no clear attributable symptoms.  In this regard he has not issues with variable functional capacity hence, pursuing rhythm control does not make a lot of sense particularly given his obesity and the relatively reduced benefits.  I think this is probably fair not withstanding the William Woods data demonstrating hard outcome benefit and patient is randomized to ablation  He would like to discuss with Dr. Merita Woods to discuss watchman as an alternative to long-term anticoagulation; he has tolerated  reasonably well.  We will repeat his echocardiogram.  In the event that his left ventricular dysfunction has worsened, I have asked him to begin to think about whether he be willing to try Entresto as opposed to losartan and almost regardless of what his LV function shows have asked him to consider an SGLT2 with a plan to discuss this when we report his echocardiogram back to him.  Continue him on his diuretics.  He remains volume overloaded.  Adjusting his diuretics will be held  in advance until we decide about the SGLT2

## 2023-01-30 NOTE — Patient Instructions (Signed)
Medication Instructions:  Your physician recommends that you continue on your current medications as directed. Please refer to the Current Medication list given to you today.  *If you need a refill on your cardiac medications before your next appointment, please call your pharmacy*   Lab Work: CBC and BMET today If you have labs (blood work) drawn today and your tests are completely normal, you will receive your results only by: MyChart Message (if you have MyChart) OR A paper copy in the mail If you have any lab test that is abnormal or we need to change your treatment, we will call you to review the results.   Testing/Procedures: Your physician has requested that you have an echocardiogram. Echocardiography is a painless test that uses sound waves to create images of your heart. It provides your doctor with information about the size and shape of your heart and how well your heart's chambers and valves are working. This procedure takes approximately one hour. There are no restrictions for this procedure. Please do NOT wear cologne, perfume, aftershave, or lotions (deodorant is allowed). Please arrive 15 minutes prior to your appointment time.    Follow-Up: At [facility], you and your health needs are our priority.  As part of our continuing mission to provide you with exceptional heart care, we have created designated Provider Care Teams.  These Care Teams include your primary Cardiologist (physician) and Advanced Practice Providers (APPs -  Physician Assistants and Nurse Practitioners) who all work together to provide you with the care you need, when you need it.  We recommend signing up for the patient portal called "MyChart".  Sign up information is provided on this After Visit Summary.  MyChart is used to connect with patients for Virtual Visits (Telemedicine).  Patients are able to view lab/test results, encounter notes, upcoming appointments, etc.  Non-urgent messages can  be sent to your provider as well.   To learn more about what you can do with MyChart, go to [email or web address].    Your next appointment:   Please schedule an appointment with Dr Lalla Brothers to discuss Watchman  6 months with Dr Graciela Husbands

## 2023-02-03 ENCOUNTER — Other Ambulatory Visit: Payer: Self-pay | Admitting: Internal Medicine

## 2023-02-24 ENCOUNTER — Other Ambulatory Visit: Payer: Self-pay | Admitting: Internal Medicine

## 2023-03-03 ENCOUNTER — Ambulatory Visit: Payer: Medicare Other | Attending: Internal Medicine

## 2023-03-03 DIAGNOSIS — Z950 Presence of cardiac pacemaker: Secondary | ICD-10-CM

## 2023-03-03 DIAGNOSIS — I480 Paroxysmal atrial fibrillation: Secondary | ICD-10-CM

## 2023-03-03 DIAGNOSIS — I5020 Unspecified systolic (congestive) heart failure: Secondary | ICD-10-CM

## 2023-03-03 DIAGNOSIS — I442 Atrioventricular block, complete: Secondary | ICD-10-CM

## 2023-03-03 DIAGNOSIS — I4581 Long QT syndrome: Secondary | ICD-10-CM

## 2023-03-03 DIAGNOSIS — I429 Cardiomyopathy, unspecified: Secondary | ICD-10-CM

## 2023-03-03 LAB — ECHOCARDIOGRAM COMPLETE
AR max vel: 4.61 cm2
AV Area VTI: 4.95 cm2
AV Area mean vel: 4.27 cm2
AV Mean grad: 2 mmHg
AV Peak grad: 3.4 mmHg
Ao pk vel: 0.92 m/s
Area-P 1/2: 2.66 cm2
Calc EF: 46.3 %
S' Lateral: 4.3 cm
Single Plane A2C EF: 41.6 %
Single Plane A4C EF: 46.9 %

## 2023-03-03 MED ORDER — PERFLUTREN LIPID MICROSPHERE
1.0000 mL | INTRAVENOUS | Status: AC | PRN
Start: 2023-03-03 — End: 2023-03-03
  Administered 2023-03-03: 2 mL via INTRAVENOUS

## 2023-03-07 ENCOUNTER — Ambulatory Visit (INDEPENDENT_AMBULATORY_CARE_PROVIDER_SITE_OTHER): Payer: Medicare Other

## 2023-03-07 DIAGNOSIS — I429 Cardiomyopathy, unspecified: Secondary | ICD-10-CM | POA: Diagnosis not present

## 2023-03-07 LAB — CUP PACEART REMOTE DEVICE CHECK
Battery Remaining Longevity: 56 mo
Battery Voltage: 2.95 V
Brady Statistic AP VP Percent: 29.24 %
Brady Statistic AP VS Percent: 0.09 %
Brady Statistic AS VP Percent: 67.75 %
Brady Statistic AS VS Percent: 2.72 %
Brady Statistic RA Percent Paced: 10.92 %
Brady Statistic RV Percent Paced: 94.09 %
Date Time Interrogation Session: 20240517031828
Implantable Lead Connection Status: 753985
Implantable Lead Connection Status: 753985
Implantable Lead Connection Status: 753985
Implantable Lead Implant Date: 20130416
Implantable Lead Implant Date: 20130416
Implantable Lead Implant Date: 20200826
Implantable Lead Location: 753858
Implantable Lead Location: 753859
Implantable Lead Location: 753860
Implantable Lead Model: 5076
Implantable Lead Model: 5076
Implantable Pulse Generator Implant Date: 20200826
Lead Channel Impedance Value: 1064 Ohm
Lead Channel Impedance Value: 1102 Ohm
Lead Channel Impedance Value: 1140 Ohm
Lead Channel Impedance Value: 1292 Ohm
Lead Channel Impedance Value: 1311 Ohm
Lead Channel Impedance Value: 1387 Ohm
Lead Channel Impedance Value: 323 Ohm
Lead Channel Impedance Value: 342 Ohm
Lead Channel Impedance Value: 380 Ohm
Lead Channel Impedance Value: 380 Ohm
Lead Channel Impedance Value: 646 Ohm
Lead Channel Impedance Value: 665 Ohm
Lead Channel Impedance Value: 722 Ohm
Lead Channel Impedance Value: 798 Ohm
Lead Channel Pacing Threshold Amplitude: 0.75 V
Lead Channel Pacing Threshold Amplitude: 1 V
Lead Channel Pacing Threshold Amplitude: 2.25 V
Lead Channel Pacing Threshold Pulse Width: 0.4 ms
Lead Channel Pacing Threshold Pulse Width: 0.4 ms
Lead Channel Pacing Threshold Pulse Width: 1 ms
Lead Channel Sensing Intrinsic Amplitude: 10.5 mV
Lead Channel Sensing Intrinsic Amplitude: 10.5 mV
Lead Channel Sensing Intrinsic Amplitude: 4.375 mV
Lead Channel Sensing Intrinsic Amplitude: 4.375 mV
Lead Channel Setting Pacing Amplitude: 2 V
Lead Channel Setting Pacing Amplitude: 2.5 V
Lead Channel Setting Pacing Amplitude: 2.5 V
Lead Channel Setting Pacing Pulse Width: 0.4 ms
Lead Channel Setting Pacing Pulse Width: 1 ms
Lead Channel Setting Sensing Sensitivity: 4 mV
Zone Setting Status: 755011
Zone Setting Status: 755011

## 2023-03-19 NOTE — Progress Notes (Signed)
Remote pacemaker transmission.   

## 2023-03-25 NOTE — Progress Notes (Unsigned)
Electrophysiology Office Follow up Visit Note:    Date:  03/26/2023   ID:  William Hart., DOB Oct 15, 1946, MRN [id number]  PCP:  Oneita Hurt, No  [facility] Cardiologist:  Sherryl Manges, MD  [facility] Electrophysiologist:  Sherryl Manges, MD    Interval History:    William Harkless. is a 77 y.o. male who presents for a follow up visit.   The patient is followed by Dr. Graciela Husbands in the outpatient setting and last saw him January 30, 2023.  The patient has a history of complete heart block and has a CRT in situ.  Also with a history of obstructive sleep apnea, hypertension, chronic systolic heart failure and long QT syndrome diagnosed [facility].  He also has a history of atrial fibrillation with a CHA2DS2-VASc of at least 4.  At the last appointment with Dr. Graciela Husbands, the patient expressed an interest in left atrial appendage occlusion as a mechanism to avoid long-term exposure to anticoagulation.  He is very concerned about the risks associated with long-term use of blood thinners with his gait instability.  Uses a walker to ambulate at home intermittently.       Past medical, surgical, social and family history were reviewed.  ROS:   Please see the history of present illness.    All other systems reviewed and are negative.  EKGs/Labs/Other Studies Reviewed:    The following studies were reviewed today:  Mar 03, 2023 echo EF 40 to 45% RV function normal Mildly dilated left atrium No MR  January 30, 2023 EKG shows BiV pacing, atrial fibrillation, PVC  Physical Exam:    VS:  BP 116/62   Pulse 91   Ht 6' (1.829 m)   Wt 288 lb (130.6 kg)   SpO2 94%   BMI 39.06 kg/m     Wt Readings from Last 3 Encounters:  03/26/23 288 lb (130.6 kg)  01/30/23 293 lb 6.4 oz (133.1 kg)  09/30/22 288 lb 6.4 oz (130.8 kg)     GEN:  Well nourished, well developed in no acute distress.  Obese CARDIAC: Irregularly irregular, no murmurs, rubs, gallops.  CRT-D pocket well-healed. RESPIRATORY:   Clear to auscultation without rales, wheezing or rhonchi       ASSESSMENT:    1. Paroxysmal atrial fibrillation (HCC)   2. Complete heart block (HCC)   3. Cardiac pacemaker in situ   4. HFrEF (heart failure with reduced ejection fraction) (HCC)    PLAN:    In order of problems listed above:   #Persistent atrial fibrillation Relatively asymptomatic but is associated with reduced ejection fraction.  He has a CHA2DS2-VASc of at least 4 and is currently on an oral anticoagulant.  He has a strong desire to avoid the long-term risks associated blood thinner use and has expressed an interest in left atrial appendage occlusion.  Burden of AF 63% on recent device interrogation.  -----------  Today he expressed a desire to pursue catheter ablation of atrial fibrillation.  We discussed the pathophysiology of atrial fibrillation and treatment strategies including catheter ablation.  He knows some friends who have had A-fib ablations including 1 who is required multiple ablations to treat his atrial fibrillation.  We discussed the data supporting atrial fibrillation ablation in patients with a reduced ejection fraction.  I do think he is a reasonable candidate for catheter ablation although his weight does increase the risk slightly and reduce the success rates as well.  We discussed both of those facts during today's  clinic appointment.  Discussed treatment options today for AF including antiarrhythmic drug therapy and ablation. Discussed risks, recovery and likelihood of success with each treatment strategy. Risk, benefits, and alternatives to EP study and radiofrequency ablation for afib were discussed. These risks include but are not limited to stroke, bleeding, vascular damage, tamponade, perforation, damage to the esophagus, lungs, phrenic nerve and other structures, pulmonary vein stenosis, worsening renal function, and death.  Discussed potential need for repeat ablation procedures and  antiarrhythmic drugs after an initial ablation. The patient understands these risk and wishes to proceed.  We will therefore proceed with catheter ablation at the next available time.  Carto, ICE, anesthesia are requested for the procedure.  Will also obtain CT PV protocol prior to the procedure to exclude LAA thrombus and further evaluate atrial anatomy.   ------------  I have seen William Hart. in the office today who is being considered for a Watchman left atrial appendage closure device. I believe they will benefit from this procedure given their history of atrial fibrillation, CHA2DS2-VASc score of 4 and unadjusted ischemic stroke rate of 4.8% per year. Unfortunately, the patient is not felt to be a long term anticoagulation candidate secondary to concerns about his increased risk of bleeding while on an anticoagulant given his gait instability. The patient's chart has been reviewed and I feel that they would be a candidate for short term oral anticoagulation after Watchman implant.   It is my belief that after undergoing a LAA closure procedure, William Hart. will not need long term anticoagulation which eliminates anticoagulation side effects and major bleeding risk.   Procedural risks for the Watchman implant have been reviewed with the patient including a 0.5% risk of stroke, <1% risk of perforation and <1% risk of device embolization. Other risks include bleeding, vascular damage, tamponade, worsening renal function, and death. The patient understands these risk and wishes to proceed.     The published clinical data on the safety and effectiveness of WATCHMAN include but are not limited to the following: - Holmes DR, Everlene Farrier, Sick P et al. for the PROTECT AF Investigators. Percutaneous closure of the left atrial appendage versus warfarin therapy for prevention of stroke in patients with atrial fibrillation: a randomised non-inferiority trial. Lancet 2009; 374: 534-42. Everlene Farrier,  Doshi SK, Isa Rankin D et al. on behalf of the PROTECT AF Investigators. Percutaneous Left Atrial Appendage Closure for Stroke Prophylaxis in Patients With Atrial Fibrillation 2.3-Year Follow-up of the PROTECT AF (Watchman Left Atrial Appendage System for Embolic Protection in Patients With Atrial Fibrillation) Trial. Circulation 2013; 127:720-729. - Alli O, Doshi S,  Kar S, Reddy VY, Sievert H et al. Quality of Life Assessment in the Randomized PROTECT AF (Percutaneous Closure of the Left Atrial Appendage Versus Warfarin Therapy for Prevention of Stroke in Patients With Atrial Fibrillation) Trial of Patients at Risk for Stroke With Nonvalvular Atrial Fibrillation. J Am Coll Cardiol 2013; 61:1790-8. Aline August DR, Mia [place], Price M, Whisenant B, Sievert H, Doshi S, Huber K, Reddy V. Prospective randomized evaluation of the Watchman left atrial appendage Device in patients with atrial fibrillation versus long-term warfarin therapy; the PREVAIL trial. Journal of the [employer] of Cardiology, Vol. 4, No. 1, 2014, 1-11. - Kar S, Doshi SK, Sadhu A, Horton R, Osorio J et al. Primary outcome evaluation of a next-generation left atrial appendage closure device: results from the PINNACLE FLX trial. Circulation 2021;143(18)1754-1762.    After today's visit with the patient which was  dedicated solely for shared decision making visit regarding LAA closure device, the patient decided to proceed with the LAA appendage closure procedure scheduled to be done in the near future at [facility]. Prior to the procedure, I would like to obtain a gated CT scan of the chest with contrast timed for PV/LA visualization.   HAS-BLED score 2 Hypertension Yes  Abnormal renal and liver function (Dialysis, transplant, Cr >2.26 mg/dL /Cirrhosis or Bilirubin >2x Normal or AST/ALT/AP >3x Normal) No  Stroke No  Bleeding No  Labile INR (Unstable/high INR) No  Elderly (>65) Yes  Drugs or alcohol (? 8 drinks/week,  anti-plt or NSAID) No   CHA2DS2-VASc Score = 4  The patient's score is based upon: CHF History: 1 HTN History: 1 Diabetes History: 0 Stroke History: 0 Vascular Disease History: 0 Age Score: 2 Gender Score: 0   #Chronic systolic heart failure NYHA class II.  Warm and euvolemic on exam.  Ejection fraction 40% on most recent echo. Rhythm control indicated given systolic heart failure diagnosis.   Signed, Steffanie Dunn, MD, [facility], [facility] 03/26/2023 2:49 PM    Electrophysiology [facility]

## 2023-03-26 ENCOUNTER — Ambulatory Visit: Payer: Medicare Other | Attending: Cardiology | Admitting: Cardiology

## 2023-03-26 ENCOUNTER — Encounter: Payer: Self-pay | Admitting: Cardiology

## 2023-03-26 VITALS — BP 116/62 | HR 91 | Ht 72.0 in | Wt 288.0 lb

## 2023-03-26 DIAGNOSIS — I5020 Unspecified systolic (congestive) heart failure: Secondary | ICD-10-CM | POA: Insufficient documentation

## 2023-03-26 DIAGNOSIS — Z950 Presence of cardiac pacemaker: Secondary | ICD-10-CM | POA: Diagnosis not present

## 2023-03-26 DIAGNOSIS — I480 Paroxysmal atrial fibrillation: Secondary | ICD-10-CM | POA: Diagnosis not present

## 2023-03-26 DIAGNOSIS — I442 Atrioventricular block, complete: Secondary | ICD-10-CM | POA: Insufficient documentation

## 2023-03-26 NOTE — Patient Instructions (Addendum)
Medication Instructions:  Your physician recommends that you continue on your current medications as directed. Please refer to the Current Medication list given to you today.  *If you need a refill on your cardiac medications before your next appointment, please call your pharmacy*  Lab Work: CBC and BMET You will get your lab work at [facility].  Your lab work will be done at the Medical mall next to [employer].  These are walk in labs- you will not need an appointment and you do not need to be fasting.    Testing/Procedures: Your physician has requested that you have cardiac CT. Cardiac computed tomography (CT) is a painless test that uses an x-ray machine to take clear, detailed pictures of your heart. We will call you to arrange your CT scan, it will be done about one week prior to your ablation.   Your physician has recommended that you have an ablation. Catheter ablation is a medical procedure used to treat some cardiac arrhythmias (irregular heartbeats). During catheter ablation, a long, thin, flexible tube is put into a blood vessel in your groin (upper thigh), or neck. This tube is called an ablation catheter. It is then guided to your heart through the blood vessel. Radio frequency waves destroy small areas of heart tissue where abnormal heartbeats may cause an arrhythmia to start. Please see the instruction sheet given to you today. You are scheduled for Atrial Fibrillation Ablation on Tuesday, September 17 with Dr. Steffanie Dunn.Please arrive at the Main Entrance A at [facility]: [address] [place], [place] [address] at 8:30 AM    Follow-Up: At [facility], you and your health needs are our priority.  As part of our continuing mission to provide you with exceptional heart care, we have created designated Provider Care Teams.  These Care Teams include your primary Cardiologist (physician) and Advanced Practice Providers (APPs -   Physician Assistants and Nurse Practitioners) who all work together to provide you with the care you need, when you need it.  Your next appointment:   We will contact you to schedule your follow up appointments.  Other Instructions Watchman Device: You will be contacted by Nurse Navigator, Karsten Fells to schedule your pre-procedure visit and procedure date. If you have any questions she can be reached at [phone].

## 2023-04-01 ENCOUNTER — Telehealth: Payer: Self-pay

## 2023-04-03 ENCOUNTER — Ambulatory Visit: Payer: Medicare Other | Admitting: Internal Medicine

## 2023-04-06 ENCOUNTER — Encounter: Payer: Self-pay | Admitting: Internal Medicine

## 2023-04-07 NOTE — Progress Notes (Deleted)
Referring Physician:  Emily Filbert, MD [address] [place],  [place] [address]  Primary Physician:  Pcp, No  History of Present Illness: 04/07/2023*** Mr. Eastyn Evanoff has a history of HTN, cardiomyopathy, OSA, got, obesity, long QT syndrome, afib, and history of complete heart block.    He has a pacemaker.   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 PCP had him on prednisone.       He is on ELIQUIS.   Duration: *** Location: *** Quality: *** Severity: ***  Precipitating: aggravated by *** Modifying factors: made better by *** Weakness: none Timing: *** Bowel/Bladder Dysfunction: none  Conservative measures:  Physical therapy: ***  Multimodal medical therapy including regular antiinflammatories: ***  Injections: *** epidural steroid injections  Past Surgery: ***  [name]. has ***no symptoms of cervical myelopathy.  The symptoms are causing a significant impact on the patient's life.   Review of Systems:  A 10 point review of systems is negative, except for the pertinent positives and negatives detailed in the HPI.  Past Medical History: Past Medical History:  Diagnosis Date   CHB (complete heart block) (HCC)    Edema    Suspect diastolic heart failure   Hypertension    Long Q-T syndrome    Obesity (BMI 30-39.9)    Obstructive sleep apnea    Pacemaker    Medtronic Adapta L model ADDRL 1 (serial number [id number]) pacemaker.   Syncope    Thyroid disease     Past Surgical History: Past Surgical History:  Procedure Laterality Date   BIV UPGRADE N/A 06/16/2019   Procedure: BIVP UPGRADE - BIVPacemake Upgrade;  Surgeon: Duke Salvia, MD;  Location: [facility] INVASIVE CV LAB;  Service: Cardiovascular;  Laterality: N/A;   CARDIAC CATHETERIZATION     [facility]    CARDIAC CATHETERIZATION  01/2012   [facility]: no significant CAD, EF 40%.    LOOP RECORDER EXPLANT N/A 02/04/2012   Procedure: LOOP RECORDER EXPLANT;  Surgeon: Hillis Range, MD;  Location:  [facility] CATH LAB;  Service: Cardiovascular;  Laterality: N/A;   LOOP RECORDER IMPLANT N/A 01/21/2012   Procedure: LOOP RECORDER IMPLANT;  Surgeon: Duke Salvia, MD;  Location: [facility] CATH LAB;  Service: Cardiovascular;  Laterality: N/A;   OTHER SURGICAL HISTORY  02/04/2012    removal of a previously implanted MDT Reveal XT implantable loop recorder   PACEMAKER INSERTION  02/04/2012   Medtronic Adapta L model ADDRL 1 (serial number [id number]) pacemaker.   PERMANENT PACEMAKER INSERTION N/A 02/04/2012   Procedure: PERMANENT PACEMAKER INSERTION;  Surgeon: Hillis Range, MD;  Location: [facility] CATH LAB;  Service: Cardiovascular;  Laterality: N/A;    Allergies: Allergies as of 04/09/2023 - Review Complete 03/26/2023  Allergen Reaction Noted   Other  11/11/2013    Medications: Outpatient Encounter Medications as of 04/09/2023  Medication Sig   apixaban (ELIQUIS) 5 MG TABS tablet Take 1 tablet (5 mg total) by mouth 2 (two) times daily.   Ascorbic Acid (VITAMIN C) 1000 MG tablet Take 1,000 mg by mouth 2 (two) times daily.   Coenzyme Q10 (CO Q-10 PO) Take by mouth.   furosemide (LASIX) 80 MG tablet Take 1 tablet (80 mg total) by mouth daily.   losartan (COZAAR) 25 MG tablet Take 1 tablet (25 mg total) by mouth daily.   MAGNESIUM PO Take by mouth.   meloxicam (MOBIC) 15 MG tablet Take 15 mg by mouth daily.   metoprolol succinate (TOPROL-XL) 25 MG 24 hr  tablet Take 1 tablet (25 mg total) by mouth daily.   OVER THE COUNTER MEDICATION Cherry extract daily   spironolactone (ALDACTONE) 25 MG tablet Take 0.5 tablets (12.5 mg total) by mouth daily.   No facility-administered encounter medications on file as of 04/09/2023.    Social History: Social History   Tobacco Use   Smoking status: Former    Packs/day: 4.00    Years: 25.00    Additional pack years: 0.00    Total pack years: 100.00    Types: Cigarettes    Quit date: 02/06/1988    Years since quitting: 35.1   Smokeless tobacco: Former  Haematologist Use: Never used  Substance Use Topics   Alcohol use: Yes    Alcohol/week: 0.0 standard drinks of alcohol    Comment: occassional--special occassions   Drug use: No    Family Medical History: Family History  Problem Relation Age of Onset   Kidney disease Father        long QT disease   Arrhythmia Father        Prolong QT   Cancer Mother        leukemia   Arrhythmia Brother        Prolong QT   Bone cancer Son        deceased    Physical Examination: There were no vitals filed for this visit.  General: Patient is well developed, well nourished, calm, collected, and in no apparent distress. Attention to examination is appropriate.  Respiratory: Patient is breathing without any difficulty.   NEUROLOGICAL:     Awake, alert, oriented to person, place, and time.  Speech is clear and fluent. Fund of knowledge is appropriate.   Cranial Nerves: Pupils equal round and reactive to light.  Facial tone is symmetric.    *** ROM of cervical spine *** pain *** posterior cervical tenderness. *** tenderness in bilateral trapezial region.   *** ROM of lumbar spine *** pain *** posterior lumbar tenderness.   No abnormal lesions on exposed skin.   Strength: Side Biceps Triceps Deltoid Interossei Grip Wrist Ext. Wrist Flex.  R 5 5 5 5 5 5 5   L 5 5 5 5 5 5 5    Side Iliopsoas Quads Hamstring PF DF EHL  R 5 5 5 5 5 5   L 5 5 5 5 5 5    Reflexes are ***2+ and symmetric at the biceps, triceps, brachioradialis, patella and achilles.   Hoffman's is absent.  Clonus is not present.   Bilateral upper and lower extremity sensation is intact to light touch.     Gait is normal.   ***No difficulty with tandem gait.    Medical Decision Making  Imaging: No imaging available for my review.   I have personally reviewed the images and agree with the above interpretation.  Assessment and Plan: Mr. Dios is a pleasant 77 y.o. male has ***  Treatment options discussed with patient and  following plan made:   - Order for physical therapy for *** spine ***. Patient to call to schedule appointment. *** - Continue current medications including ***. Reviewed dosing and side effects.  - Prescription for ***. Reviewed dosing and side effects. Take with food.  - Prescription for *** to take prn muscle spasms. Reviewed dosing and side effects. Discussed this can cause drowsiness.  - MRI of *** to further evaluate *** radiculopathy. No improvement time or medications (***).  - Referral to PMR at [facility] to discuss  possible *** injections.  - Will schedule phone visit to review MRI results once I get them back.   I spent a total of *** minutes in face-to-face and non-face-to-face activities related to this patient's care today including review of outside records, review of imaging, review of symptoms, physical exam, discussion of differential diagnosis, discussion of treatment options, and documentation.   Thank you for involving me in the care of this patient.   Drake Leach PA-C Dept. of Neurosurgery

## 2023-04-09 ENCOUNTER — Ambulatory Visit: Payer: Medicare Other | Admitting: Orthopedic Surgery

## 2023-04-17 NOTE — Progress Notes (Signed)
Referring Physician:  Emily Filbert, MD [address] [place],  [place] [address]  Primary Physician:  Pcp, No  History of Present Illness: 04/21/2023 [name] has a history of HTN, cardiomyopathy, OSA, got, obesity, long QT syndrome, afib, and history of complete heart block.    He has a pacemaker.   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  PCP had him on prednisone. He's had intermittent flare ups of  back pain in the past that are usually self resolving. Pain has never lasted this long or gone into the right leg.   He has 5-6 week history of intermittent LBP with intermittent  right groin pain that is worse with walking, standing. Pain is a little better then it was at the onset. No left leg pain. No numbness or tingling in right leg. He feels some weakness in right leg with walking.   He has relief with prn prednisone- he took this at onset of above pain.   Wife is concerned about how deconditioned he is- taking a shower makes him exhausted.   He is on ELIQUIS.   Bowel/Bladder Dysfunction: none  Conservative measures:  Physical therapy: did about 2 years ago for his back at [facility] therapy including regular antiinflammatories: prednisone  Injections: No epidural steroid injections  Past Surgery: no spinal surgery.   William Woods. has no symptoms of cervical myelopathy.  The symptoms are causing a significant impact on the patient's life.   Review of Systems:  A 10 point review of systems is negative, except for the pertinent positives and negatives detailed in the HPI.  Past Medical History: Past Medical History:  Diagnosis Date   CHB (complete heart block) (HCC)    Edema    Suspect diastolic heart failure   Hypertension    Long Q-T syndrome    Obesity (BMI 30-39.9)    Obstructive sleep apnea    Pacemaker    Medtronic Adapta L model ADDRL 1 (serial number [id number]) pacemaker.   Syncope    Thyroid disease      Past Surgical History: Past Surgical History:  Procedure Laterality Date   BIV UPGRADE N/A 06/16/2019   Procedure: BIVP UPGRADE - BIVPacemake Upgrade;  Surgeon: Duke Salvia, MD;  [facility]: [facility] INVASIVE CV LAB;  Service: Cardiovascular;  Laterality: N/A;   CARDIAC CATHETERIZATION     [facility]    CARDIAC CATHETERIZATION  01/2012   [facility]: no significant CAD, EF 40%.    LOOP RECORDER EXPLANT N/A 02/04/2012   Procedure: LOOP RECORDER EXPLANT;  Surgeon: Hillis Range, MD;  Location: [facility] CATH LAB;  Service: Cardiovascular;  Laterality: N/A;   LOOP RECORDER IMPLANT N/A 01/21/2012   Procedure: LOOP RECORDER IMPLANT;  Surgeon: Duke Salvia, MD;  Location: [facility] CATH LAB;  Service: Cardiovascular;  Laterality: N/A;   OTHER SURGICAL HISTORY  02/04/2012    removal of a previously implanted MDT Reveal XT implantable loop recorder   PACEMAKER INSERTION  02/04/2012   Medtronic Adapta L model ADDRL 1 (serial number [id number]) pacemaker.   PERMANENT PACEMAKER INSERTION N/A 02/04/2012   Procedure: PERMANENT PACEMAKER INSERTION;  Surgeon: Hillis Range, MD;  Location: [facility] CATH LAB;  Service: Cardiovascular;  Laterality: N/A;    Allergies: Allergies as of 04/21/2023 - Review Complete 04/21/2023  Allergen Reaction Noted   Other  11/11/2013    Medications: Outpatient Encounter Medications as of 04/21/2023  Medication Sig   apixaban (ELIQUIS) 5 MG TABS tablet Take  1 tablet (5 mg total) by mouth 2 (two) times daily.   Ascorbic Acid (VITAMIN C) 1000 MG tablet Take 1,000 mg by mouth 2 (two) times daily.   Coenzyme Q10 (CO Q-10 PO) Take by mouth.   furosemide (LASIX) 80 MG tablet Take 1 tablet (80 mg total) by mouth daily.   losartan (COZAAR) 25 MG tablet Take 1 tablet (25 mg total) by mouth daily.   MAGNESIUM PO Take by mouth.   meloxicam (MOBIC) 15 MG tablet Take 15 mg by mouth daily.   metoprolol succinate (TOPROL-XL) 25 MG 24 hr tablet Take 1 tablet (25 mg total) by mouth daily.   OVER THE COUNTER MEDICATION  Cherry extract daily   spironolactone (ALDACTONE) 25 MG tablet Take 0.5 tablets (12.5 mg total) by mouth daily.   No facility-administered encounter medications on file as of 04/21/2023.    Social History: Social History   Tobacco Use   Smoking status: Former    Packs/day: 4.00    Years: 25.00    Additional pack years: 0.00    Total pack years: 100.00    Types: Cigarettes    Quit date: 02/06/1988    Years since quitting: 35.2   Smokeless tobacco: Former  Building services engineer Use: Never used  Substance Use Topics   Alcohol use: Yes    Alcohol/week: 0.0 standard drinks of alcohol    Comment: occassional--special occassions   Drug use: No    Family Medical History: Family History  Problem Relation Age of Onset   Kidney disease Father        long QT disease   Arrhythmia Father        Prolong QT   Cancer Mother        leukemia   Arrhythmia Brother        Prolong QT   Bone cancer Son        deceased    Physical Examination: Vitals:   04/21/23 1104  BP: 130/77    General: Patient is well developed, well nourished, calm, collected, and in no apparent distress. Attention to examination is appropriate.  Respiratory: Patient is breathing without any difficulty.   NEUROLOGICAL:     Awake, alert, oriented to person, place, and time.  Speech is clear and fluent. Fund of knowledge is appropriate.   Cranial Nerves: Pupils equal round and reactive to light.  Facial tone is symmetric.    Mild central lower lumbar tenderness.   No abnormal lesions on exposed skin.   Strength: Side Biceps Triceps Deltoid Interossei Grip Wrist Ext. Wrist Flex.  R 5 5 5 5 5 5 5   L 5 5 5 5 5 5 5    Side Iliopsoas Quads Hamstring PF DF EHL  R 5 5 5 5 5 5   L 5 5 5 5 5 5    Reflexes are 2+ and symmetric at the biceps, triceps, brachioradialis, patella and achilles.   Hoffman's is absent.  Clonus is not present.   Bilateral upper and lower extremity sensation is intact to light touch.      Gait is not tested. He is in a WC.   Medical Decision Making  Imaging: No imaging available for my review.    Assessment and Plan: [name] is a pleasant 77 y.o. male has history of intermittent flare ups of  back pain in the past that are usually self resolving. Pain has never lasted this long or gone into the right leg.   Now with a 5-6  week history of intermittent LBP with intermittent  right groin pain that is worse with walking, standing. No left leg pain. No numbness or tingling in right leg. He feels some weakness in right leg with walking.   No recent lumbar imaging. Has been told he has DISH in the past.   Treatment options discussed with patient and following plan made:   - Order for physical therapy for lumbar spine to [facility]. Patient to call to schedule appointment.  - Would hold on further prednisone since he just had a course 4-5 weeks ago.  - CT scan of lumbar spine ordered to further evaluate lumbar radiculopathy. Unable to have MRI due to pacemaker.  - Depending on results of CT, will likely consider lumbar injections.  - Will schedule phone visit to review CT results once I get them back.   I spent a total of 35 minutes in face-to-face and non-face-to-face activities related to this patient's care today including review of outside records, review of imaging, review of symptoms, physical exam, discussion of differential diagnosis, discussion of treatment options, and documentation.   Thank you for involving me in the care of this patient.   Drake Leach PA-C Dept. of Neurosurgery

## 2023-04-20 ENCOUNTER — Encounter: Payer: Self-pay | Admitting: Internal Medicine

## 2023-04-21 ENCOUNTER — Encounter: Payer: Self-pay | Admitting: Orthopedic Surgery

## 2023-04-21 ENCOUNTER — Ambulatory Visit (INDEPENDENT_AMBULATORY_CARE_PROVIDER_SITE_OTHER): Payer: Medicare Other | Admitting: Orthopedic Surgery

## 2023-04-21 VITALS — BP 130/77 | Ht 72.0 in | Wt 277.4 lb

## 2023-04-21 DIAGNOSIS — M5441 Lumbago with sciatica, right side: Secondary | ICD-10-CM | POA: Diagnosis not present

## 2023-04-21 DIAGNOSIS — M5416 Radiculopathy, lumbar region: Secondary | ICD-10-CM

## 2023-04-21 NOTE — Patient Instructions (Signed)
It was so nice to see you today. Thank you so much for coming in.   I want to get a CT scan of your lower back to look into things further. We will get this approved through your insurance and [facility] will call you to schedule the appointment.   For CT, you can go to [facility] (building with the white pillars) off of [address]. The address is [address] [place], [place] [address].   I sent physical therapy orders to [facility]. You can call them at [phone] if you don't hear from them to schedule your visit.   It will take 5-7 days for me to get the CT results after you have the scan. Once I have the results, we will call you to schedule a phone visit to review them.   Please do not hesitate to call if you have any questions or concerns. You can also message me in MyChart.   If you have not heard back about the CT scan in the next week, please call the office so we can help you get it scheduled.   Drake Leach PA-C [phone]

## 2023-04-21 NOTE — Telephone Encounter (Signed)
Remote tranmission received and reviewed.   Normal device function. Patients presenting rhythm appears AF w/ PVC's, controlled rates. BIV pacing % 94.1%. Patient is scheduled for AF ablation 07/08/23.

## 2023-04-25 ENCOUNTER — Ambulatory Visit
Admission: RE | Admit: 2023-04-25 | Discharge: 2023-04-25 | Disposition: A | Discharge status: Home / Self Care | Payer: Medicare Other | Source: Ambulatory Visit | Attending: Orthopedic Surgery | Admitting: Orthopedic Surgery

## 2023-04-25 DIAGNOSIS — M5441 Lumbago with sciatica, right side: Secondary | ICD-10-CM | POA: Diagnosis not present

## 2023-04-25 DIAGNOSIS — M48061 Spinal stenosis, lumbar region without neurogenic claudication: Secondary | ICD-10-CM | POA: Diagnosis not present

## 2023-04-25 DIAGNOSIS — M5416 Radiculopathy, lumbar region: Secondary | ICD-10-CM | POA: Diagnosis not present

## 2023-04-25 DIAGNOSIS — M5136 Other intervertebral disc degeneration, lumbar region: Secondary | ICD-10-CM | POA: Diagnosis not present

## 2023-04-25 DIAGNOSIS — M47817 Spondylosis without myelopathy or radiculopathy, lumbosacral region: Secondary | ICD-10-CM | POA: Diagnosis not present

## 2023-04-25 DIAGNOSIS — M47816 Spondylosis without myelopathy or radiculopathy, lumbar region: Secondary | ICD-10-CM | POA: Diagnosis not present

## 2023-05-05 NOTE — Progress Notes (Unsigned)
Telephone Visit- Progress Note: Referring Physician:  No referring provider defined for this encounter.  Primary Physician:  Pcp, No  This visit was performed via telephone.  Patient location: home Provider location: office  I spent a total of 10 minutes non-face-to-face activities for this visit on the date of this encounter including review of current clinical condition and response to treatment.    Patient has given verbal consent to this telephone visits and we reviewed the limitations of a telephone visit. Patient wishes to proceed.    Chief Complaint:  review lumbar CT scan  History of Present Illness: William Woods. is a 77 y.o. male has a history of HTN, cardiomyopathy, OSA, got, obesity, long QT syndrome, afib, and history of complete heart block.     He has a pacemaker.    Last seen by me on 04/21/23 for intermittent LBP with intermittent right groin pain that is worse with walking and standing. 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   PT was ordered at his last visit and phone visit is scheduled to review his lumbar CT scan.   No change in symptoms. He continues with intermittent LBP with intermittent  right groin pain that is worse with walking, standing. No left leg pain. No numbness or tingling in right leg. He feels some weakness in right leg with walking.   He has appointment with [facility] tomorrow. Has been trying to do some exercises at home and they have helped.    He is on ELIQUIS.     Conservative measures:  Physical therapy: did about 2 years ago for his back at [facility], initial eval at [facility] on 05/07/23 Multimodal medical therapy including regular antiinflammatories: prednisone  Injections: No epidural steroid injections   Past Surgery: no spinal surgery.    [name]. has no symptoms of cervical myelopathy.   The symptoms are causing a significant impact on the patient's life.  Exam: No exam  done as this was a telephone encounter.     Imaging: CT of lumbar spine dated 04/25/23:  FINDINGS: Segmentation: Standard; the lowest formed disc space is designated L5-S1.   Alignment: There is minimal levocurvature which may be positional. There is no antero or retrolisthesis.   Vertebrae: Vertebral body heights are preserved, without evidence of acute fracture. There are flowing anterior osteophytes extending from T12 through L4 consistent with diffuse idiopathic skeletal hyperostosis. There is partial osseous fusion across the L2-L3 disc space as well as fusion across the facet joints at T12 through L4. There is no suspicious osseous lesion.   Paraspinal and other soft tissues: Unremarkable.   Disc levels: The disc heights are overall preserved.   T12-L1: No significant spinal canal or neural foraminal stenosis.   L1-L2: Mild right worse than left facet arthropathy without significant spinal canal or neural foraminal stenosis.   L2-L3: Mild bilateral neural foraminal stenosis without significant spinal canal or neural foraminal stenosis   L3-L4: Mild bilateral facet arthropathy without significant spinal canal or neural foraminal stenosis   L4-L5: There is a mild disc bulge, mild endplate spurring, and mild facet arthropathy resulting in mild bilateral neural foraminal stenosis without significant spinal canal stenosis.   L5-S1: There is a minimal disc bulge, mild endplate spurring, and moderate right and mild left facet arthropathy without significant spinal canal or neural foraminal stenosis.   IMPRESSION: 1. No acute findings in the lumbar spine. 2. Findings of diffuse idiopathic skeletal hyperostosis  in the lower thoracic and upper lumbar spine with partial osseous fusion across the L2-L3 disc space and fusion across the facet joints at T12 through L4. 3. Mild disc bulge and facet arthropathy at L4-L5 resulting in mild bilateral neural foraminal stenosis. No  significant spinal canal stenosis at any level. 4. Moderate right and mild left facet arthropathy at L5-S1.     Electronically Signed   By: Lesia Hausen M.D.   On: 05/02/2023 08:31  I have personally reviewed the images and agree with the above interpretation.  Assessment and Plan: Mr. Glantz is a pleasant 77 y.o. male with intermittent flare ups of  back pain in the past that are usually self resolving. Pain has never lasted this long or gone into the right leg.   He continues with intermittent LBP with intermittent  right groin pain that is worse with walking, standing. No left leg pain.   He has known DISH and CT shows diffuse facet hypertrophy. He has disc bulge at L4-L5 with mild bilateral foraminal stenosis. LBP is likely multifactorial. Right leg pain may be lumbar mediated as well, but don't see any compression at L3-L4. Pain may be from right hip?    Now with a 5-6 week history of intermittent LBP with intermittent  right groin pain that is worse with walking, standing. No left leg pain. No numbness or tingling in right leg. He feels some weakness in right leg with walking.    Treatment options discussed with patient and his wife. Following plan made:    - Start PT as scheduled tomorrow at [facility].  - Would hold on further prednisone since he just had a course 4-5 weeks ago.  - Discussed referral for possible lumbar injections. He wants to try PT first.  - Follow up with me in 8-12 weeks for recheck. Will be sure to do good hip exam if no improvement in right groin pain.    Drake Leach PA-C Neurosurgery

## 2023-05-06 ENCOUNTER — Ambulatory Visit (INDEPENDENT_AMBULATORY_CARE_PROVIDER_SITE_OTHER): Payer: Medicare Other | Admitting: Orthopedic Surgery

## 2023-05-06 ENCOUNTER — Encounter: Payer: Self-pay | Admitting: Orthopedic Surgery

## 2023-05-06 DIAGNOSIS — M4810 Ankylosing hyperostosis [Forestier], site unspecified: Secondary | ICD-10-CM | POA: Diagnosis not present

## 2023-05-06 DIAGNOSIS — M5116 Intervertebral disc disorders with radiculopathy, lumbar region: Secondary | ICD-10-CM

## 2023-05-06 DIAGNOSIS — M48061 Spinal stenosis, lumbar region without neurogenic claudication: Secondary | ICD-10-CM | POA: Diagnosis not present

## 2023-05-06 DIAGNOSIS — M5416 Radiculopathy, lumbar region: Secondary | ICD-10-CM

## 2023-05-09 DIAGNOSIS — M545 Low back pain, unspecified: Secondary | ICD-10-CM | POA: Diagnosis not present

## 2023-05-09 DIAGNOSIS — M25551 Pain in right hip: Secondary | ICD-10-CM | POA: Diagnosis not present

## 2023-05-22 ENCOUNTER — Encounter: Payer: Self-pay | Admitting: Internal Medicine

## 2023-05-22 ENCOUNTER — Encounter: Payer: Self-pay | Admitting: Cardiology

## 2023-05-24 ENCOUNTER — Encounter: Payer: Self-pay | Admitting: Oncology

## 2023-05-26 ENCOUNTER — Telehealth: Payer: Self-pay | Admitting: Internal Medicine

## 2023-05-26 DIAGNOSIS — M5416 Radiculopathy, lumbar region: Secondary | ICD-10-CM

## 2023-05-26 DIAGNOSIS — M4810 Ankylosing hyperostosis [Forestier], site unspecified: Secondary | ICD-10-CM

## 2023-05-26 DIAGNOSIS — M5441 Lumbago with sciatica, right side: Secondary | ICD-10-CM

## 2023-05-26 NOTE — Telephone Encounter (Signed)
I contacted patient wife, advised that her husband is SOB and extremely fatigued, when he does the smallest activity he has to take breaks. PCP did draw recent blood work (it is under media tab, patient entered). His iron levels were low and they sent him to hematology (he sees them on Wednesday 08/07). Wife is concerned and questioning about the upcoming ablation scheduled for 09/17. Patient also has an appointment to see Pulmonology on 08/14. I did see message from Dr.Lambert that recommended patient see Suzann 1-2 weeks. Patient wife requesting an appointment this week, they did get schedule for tomorrow 08/05 at 2:00 PM to discuss further. Patient does not have any swelling noted, wife states he has actually lost weight. She states he is continuing with his medication, no changes.   Will route to MD's, and NP as well to make aware.    Thanks!

## 2023-05-26 NOTE — Telephone Encounter (Signed)
H  GM2U  I can not see the blood work with low iron, last Hgb we see is 13.+   Some of his fatigue and dyspnea may be related to his persistent afib and trying to get him back in sinus prob makes sense Perhaps we can get him next week wi SR on a day when I too am in the office and we can think about him and that issue together Thanks SK

## 2023-05-26 NOTE — Telephone Encounter (Signed)
Pt c/o Shortness Of Breath: STAT if SOB developed within the last 24 hours or pt is noticeably SOB on the phone  1. Are you currently SOB (can you hear that pt is SOB on the phone)?   2. How long have you been experiencing SOB?  About 2 months   3. Are you SOB when sitting or when up moving around?  When up and moving around  4. Are you currently experiencing any other symptoms?  Weakness/fatigue, back issues

## 2023-05-26 NOTE — Telephone Encounter (Signed)
William Woods   thx and well done

## 2023-05-27 ENCOUNTER — Ambulatory Visit: Payer: Medicare Other | Admitting: Cardiology

## 2023-05-27 ENCOUNTER — Encounter: Payer: Self-pay | Admitting: Cardiology

## 2023-05-27 VITALS — BP 122/70 | HR 86 | Ht 72.0 in | Wt 272.0 lb

## 2023-05-27 DIAGNOSIS — I480 Paroxysmal atrial fibrillation: Secondary | ICD-10-CM

## 2023-05-27 DIAGNOSIS — I5022 Chronic systolic (congestive) heart failure: Secondary | ICD-10-CM | POA: Diagnosis not present

## 2023-05-27 DIAGNOSIS — Z95810 Presence of automatic (implantable) cardiac defibrillator: Secondary | ICD-10-CM | POA: Diagnosis present

## 2023-05-27 DIAGNOSIS — I4581 Long QT syndrome: Secondary | ICD-10-CM

## 2023-05-27 LAB — CUP PACEART INCLINIC DEVICE CHECK
Date Time Interrogation Session: 20240806155223
Implantable Lead Connection Status: 753985
Implantable Lead Connection Status: 753985
Implantable Lead Connection Status: 753985
Implantable Lead Implant Date: 20130416
Implantable Lead Implant Date: 20130416
Implantable Lead Implant Date: 20200826
Implantable Lead Location: 753858
Implantable Lead Location: 753859
Implantable Lead Location: 753860
Implantable Lead Model: 5076
Implantable Lead Model: 5076
Implantable Pulse Generator Implant Date: 20200826

## 2023-05-27 NOTE — Progress Notes (Signed)
Cardiology Office Note Date:  05/27/2023  Patient ID:  William Kaz., DOB 05/03/1946, MRN [id number] PCP:  Oneita Hurt, No  Cardiologist:  Sherryl Manges, MD Electrophysiologist: Sherryl Manges, MD    Chief Complaint: fatigue  History of Present Illness: William Knierim. is a 77 y.o. male with PMH notable for CHB, HFrEF s/p CRT, OSA on CPAP, HTN, parox AFib, long QT; seen today for Sherryl Manges, MD for acute visit due to fatigue with minimal activity and SOB.   The patient saw Dr. Lalla Brothers 03/2023 to discuss LAAO with Watchman device and AFib ablation. The patient desires to avoid long-term risks of OAC with his gait instability. He is scheduled for AFib ablation 07/08/2023, with plans for Watchman implant at a later date.  He contacted clinic late July regarding increased SOB, fatigue. PCP obtained lab work with a reduced Hgb -- 13.7 > 12.7 and low iron levels. PCP has referred to hematology for further eval and this appt is scheduled for tomorrow.    On follow-up today, patient states that d/t his spinal condition and hip pain, he walks as little as possible. He sleeps in chair because he is unable to lay flat comfortably. Decreased appetite as of late. He has been on 80mg  lasix long-term, used to have robust UOP but has noticed he is not urinating as much. He does admit to drinking less fluids so as to prevent needing walking to bathroom with hip pain.  He denies palpitations, is unware of when he is in AFib vs NSR. He continues to take eiliquis BID, no bleeding issues. His wife is concerned about the eliquis with his recent labwork.    he denies chest pain, palpitations, dyspnea, PND, orthopnea, nausea, vomiting, dizziness, syncope, weight gain, or early satiety.     Device Information: MDT CRT-d, imp 01/2012; dx CHB, syncope St. Jude LV lead imp 05/2019; dx HFrEF  AAD History: None d/t long QT  Past Medical History:  Diagnosis Date   CHB (complete heart block) (HCC)    Edema    Suspect  diastolic heart failure   Hypertension    Long Q-T syndrome    Obesity (BMI 30-39.9)    Obstructive sleep apnea    Pacemaker    Medtronic Adapta L model ADDRL 1 (serial number [id number]) pacemaker.   Syncope    Thyroid disease     Past Surgical History:  Procedure Laterality Date   BIV UPGRADE N/A 06/16/2019   Procedure: BIVP UPGRADE - BIVPacemake Upgrade;  Surgeon: Duke Salvia, MD;  Location: [facility] INVASIVE CV LAB;  Service: Cardiovascular;  Laterality: N/A;   CARDIAC CATHETERIZATION     [facility]    CARDIAC CATHETERIZATION  01/2012   [facility]: no significant CAD, EF 40%.    LOOP RECORDER EXPLANT N/A 02/04/2012   Procedure: LOOP RECORDER EXPLANT;  Surgeon: Hillis Range, MD;  Location: [facility] CATH LAB;  Service: Cardiovascular;  Laterality: N/A;   LOOP RECORDER IMPLANT N/A 01/21/2012   Procedure: LOOP RECORDER IMPLANT;  Surgeon: Duke Salvia, MD;  Location: [facility] CATH LAB;  Service: Cardiovascular;  Laterality: N/A;   OTHER SURGICAL HISTORY  02/04/2012    removal of a previously implanted MDT Reveal XT implantable loop recorder   PACEMAKER INSERTION  02/04/2012   Medtronic Adapta L model ADDRL 1 (serial number [id number]) pacemaker.   PERMANENT PACEMAKER INSERTION N/A 02/04/2012   Procedure: PERMANENT PACEMAKER INSERTION;  Surgeon: Hillis Range, MD;  Location: [facility] CATH LAB;  Service: Cardiovascular;  Laterality: N/A;  Current Outpatient Medications  Medication Instructions   Coenzyme Q10 (CO Q-10 PO) Oral   Eliquis 5 mg, Oral, 2 times daily   furosemide (LASIX) 80 mg, Oral, Daily   losartan (COZAAR) 25 mg, Oral, Daily   MAGNESIUM PO Oral   meloxicam (MOBIC) 15 mg, Oral, Daily   metoprolol succinate (TOPROL-XL) 25 mg, Oral, Daily   OVER THE COUNTER MEDICATION Cherry extract daily   spironolactone (ALDACTONE) 12.5 mg, Oral, Daily   vitamin C 1,000 mg, Oral, 2 times daily    Social History:  The patient  reports that he quit smoking about 35 years ago. His smoking use included cigarettes. He  started smoking about 60 years ago. He has a 100 pack-year smoking history. He has quit using smokeless tobacco. He reports current alcohol use. He reports that he does not use drugs.   Family History:   The patient's family history includes Arrhythmia in his brother and father; Bone cancer in his son; Cancer in his mother; Kidney disease in his father.  ROS:  Please see the history of present illness. All other systems are reviewed and otherwise negative.   PHYSICAL EXAM:  VS:  BP 122/70 (BP Location: Left Arm, Patient Position: Sitting, Cuff Size: Normal)   Pulse 86   Ht 6' (1.829 m)   Wt 272 lb (123.4 kg)   SpO2 96%   BMI 36.89 kg/m  BMI: Body mass index is 36.89 kg/m.  GEN- The patient is well appearing, alert and oriented x 3 today.   Lungs- Clear to ausculation bilaterally, normal work of breathing.  Heart- Regular rate and rhythm, no murmurs, rubs or gallops Extremities- 1+ peripheral edema, warm, dry Skin-   device pocket well-healed, no tethering   Device interrogation done today and reviewed by myself:  Battery 4 years Lead thresholds, impedence, sensing stable  Chronically elevated LV threshold Dependent today ~50% AF burden 96% V pacing  No changes made today  EKG is ordered. Personal review of EKG from today shows:    EKG Interpretation Date/Time:  Tuesday May 27 2023 14:10:26 EDT Ventricular Rate:  86 PR Interval:  142 QRS Duration:  150 QT Interval:  420 QTC Calculation: 502 R Axis:   246  Text Interpretation: ATRIAL SENSING Biventricularly paced Confirmed by Sherie Don [phone]) on 05/27/2023 3:40:48 PM    Recent Labs: 01/30/2023: BUN 26; Creatinine, Ser 1.05; Hemoglobin 13.7; Platelets 155; Potassium 3.9; Sodium 137  No results found for requested labs within last 365 days.   CrCl cannot be calculated (Patient's most recent lab result is older than the maximum 21 days allowed.).   Wt Readings from Last 3 Encounters:  05/27/23 272 lb (123.4 kg)   04/21/23 277 lb 6.4 oz (125.8 kg)  03/26/23 288 lb (130.6 kg)     Additional studies reviewed include: Previous EP, cardiology notes.   TTE, 03/03/2023 1. Left ventricular ejection fraction, by estimation, is 40 to 45%. The left ventricle has mild to moderately decreased function. The left ventricle demonstrates global hypokinesis. There is moderate left ventricular hypertrophy. Left ventricular diastolic parameters are consistent with Grade I diastolic dysfunction (impaired relaxation).   2. Right ventricular systolic function is normal. The right ventricular size is normal.   3. Left atrial size was mildly dilated.   4. The mitral valve is normal in structure. No evidence of mitral valve regurgitation.   5. The aortic valve was not well visualized. Aortic valve regurgitation is trivial.   6. Aortic dilatation noted. There is mild  dilatation of the aortic root, measuring 44 mm. There is mild dilatation of the ascending aorta, measuring 41 mm.   7. The inferior vena cava is normal in size with greater than 50% respiratory variability, suggesting right atrial pressure of 3 mmHg.   Comparison(s): 07/18/21 (Definity) 40-45%, mod LVH, BAE, Asc ao 40mm.   TTE, 07/17/2021  1. Left ventricular ejection fraction, by estimation, is 40 to 45%. The left ventricle has mildly decreased function. The left ventricle demonstrates global hypokinesis. There is moderate left ventricular hypertrophy. Left ventricular diastolic parameters are indeterminate.   2. Right ventricular systolic function is normal. The right ventricular size is normal. Tricuspid regurgitation signal is inadequate for assessing PA pressure.   3. Left atrial size was mildly dilated.   4. Right atrial size was mildly dilated.   5. The mitral valve was not well visualized. Trivial mitral valve regurgitation. No evidence of mitral stenosis.   6. The aortic valve was not well visualized. Aortic valve regurgitation is trivial. No aortic  stenosis is present.   7. There is mild dilatation of the ascending aorta, measuring 40 mm.   8. The inferior vena cava is normal in size with greater than 50% respiratory variability, suggesting right atrial pressure of 3 mmHg.   Comparison(s): LVEF 25-30%.   TTE, 12/21/2019  1. Left ventricular ejection fraction, by estimation, is 25 to 30%. The left ventricle has severely decreased function. The left ventricle demonstrates global hypokinesis. There is mild left ventricular hypertrophy. Left ventricular diastolic parameters  are indeterminate.   2. Right ventricular systolic function is low normal. The right ventricular size is normal.   3. The mitral valve is grossly normal. Trivial mitral valve regurgitation.   4. The aortic valve was not well visualized. Aortic valve regurgitation is not visualized.   5. The inferior vena cava is normal in size with greater than 50% respiratory variability, suggesting right atrial pressure of 3 mmHg.     ASSESSMENT AND PLAN:  #) parox AFib #) Long QT Continues to have elevated AFib burden, but not significantly worse than prior No AAD options given long QT Currently about 50% AF burden Ventricular rates well-controlled during AFib Continue 25mg  toprol  #) Hypercoag d/t parox afib CHA2DS2-VASc Score = 4 [CHF History: 1, HTN History: 1, Diabetes History: 0, Stroke History: 0, Vascular Disease History: 0, Age Score: 2, Gender Score: 0].  Therefore, the patient's annual risk of stroke is 4.8 %.    Stroke ppx - 5mg  eliquis, appropriately dosed No bleeding concerns  #) HFimpEF though still mid-range #) DISH #) hip pain NYHA III Unable to discern whether increased fatigue and SOB d/t worsening HF or from deconditioning.  Patient is minimally active throughout the day by report and by device interrogation Increased lower extremity edema - increase lasix to 80mg  BID x 3 days, then resume 80mg  daily Continue 25mg  losartan, 12.5mg  spiro, 25mg   toprol Recommended he continue to work with PT as much as possible and do exercises on his own  #) ICM, s/p Medtronic CRT-D Device functioning well, see paceart for details   Current medicines are reviewed at length with the patient today.   The patient has concerns regarding his medicines.  The following changes were made today:   INCREASE 80mg  lasix to BID x 3 days, then resume daily  Labs/ tests ordered today include:  Orders Placed This Encounter  Procedures   EKG 12-Lead     Disposition: Follow up with Dr. Lalla Brothers or EP APP  as usual post procedure   Signed, Sherie Don, NP  05/27/23  3:40 PM  Electrophysiology [facility]

## 2023-05-27 NOTE — Patient Instructions (Addendum)
Medication Instructions:  Your physician has recommended you make the following change in your medication:   Take Furosemide 80 mg tablet by mouth twice daily for 3 days.  *If you need a refill on your cardiac medications before your next appointment, please call your pharmacy*   Lab Work:  No labs ordered today.  If you have labs (blood work) drawn today and your tests are completely normal, you will receive your results only by: MyChart Message (if you have MyChart) OR A paper copy in the mail If you have any lab test that is abnormal or we need to change your treatment, we will call you to review the results.   Testing/Procedures:  No testing ordered today.   Follow-Up: At [facility], you and your health needs are our priority.  As part of our continuing mission to provide you with exceptional heart care, we have created designated Provider Care Teams.  These Care Teams include your primary Cardiologist (physician) and Advanced Practice Providers (APPs -  Physician Assistants and Nurse Practitioners) who all work together to provide you with the care you need, when you need it.  We recommend signing up for the patient portal called "MyChart".  Sign up information is provided on this After Visit Summary.  MyChart is used to connect with patients for Virtual Visits (Telemedicine).  Patients are able to view lab/test results, encounter notes, upcoming appointments, etc.  Non-urgent messages can be sent to your provider as well.   To learn more about what you can do with MyChart, go to [email or web address].    Your next appointment:   Follow up as scheduled   Provider:   Sherie Don, NP

## 2023-05-28 ENCOUNTER — Inpatient Hospital Stay: Payer: Medicare Other

## 2023-05-28 ENCOUNTER — Inpatient Hospital Stay: Payer: Medicare Other | Attending: Oncology | Admitting: Oncology

## 2023-05-28 ENCOUNTER — Encounter: Payer: Self-pay | Admitting: Oncology

## 2023-05-28 VITALS — BP 127/58 | HR 63 | Temp 96.2°F | Resp 18 | Wt 270.6 lb

## 2023-05-28 DIAGNOSIS — R0602 Shortness of breath: Secondary | ICD-10-CM | POA: Diagnosis not present

## 2023-05-28 DIAGNOSIS — R790 Abnormal level of blood mineral: Secondary | ICD-10-CM | POA: Diagnosis not present

## 2023-05-28 DIAGNOSIS — Z808 Family history of malignant neoplasm of other organs or systems: Secondary | ICD-10-CM | POA: Diagnosis not present

## 2023-05-28 DIAGNOSIS — D649 Anemia, unspecified: Secondary | ICD-10-CM | POA: Diagnosis not present

## 2023-05-28 DIAGNOSIS — Z87891 Personal history of nicotine dependence: Secondary | ICD-10-CM | POA: Diagnosis not present

## 2023-05-28 DIAGNOSIS — Z806 Family history of leukemia: Secondary | ICD-10-CM | POA: Diagnosis not present

## 2023-05-28 DIAGNOSIS — R5383 Other fatigue: Secondary | ICD-10-CM | POA: Insufficient documentation

## 2023-05-28 LAB — CBC WITH DIFFERENTIAL/PLATELET
Abs Immature Granulocytes: 0.03 10*3/uL (ref 0.00–0.07)
Basophils Absolute: 0 10*3/uL (ref 0.0–0.1)
Basophils Relative: 0 %
Eosinophils Absolute: 0.2 10*3/uL (ref 0.0–0.5)
Eosinophils Relative: 2 %
HCT: 38.6 % — ABNORMAL LOW (ref 39.0–52.0)
Hemoglobin: 12.1 g/dL — ABNORMAL LOW (ref 13.0–17.0)
Immature Granulocytes: 0 %
Lymphocytes Relative: 24 %
Lymphs Abs: 2.2 10*3/uL (ref 0.7–4.0)
MCH: 26.1 pg (ref 26.0–34.0)
MCHC: 31.3 g/dL (ref 30.0–36.0)
MCV: 83.2 fL (ref 80.0–100.0)
Monocytes Absolute: 1 10*3/uL (ref 0.1–1.0)
Monocytes Relative: 12 %
Neutro Abs: 5.6 10*3/uL (ref 1.7–7.7)
Neutrophils Relative %: 62 %
Platelets: 193 10*3/uL (ref 150–400)
RBC: 4.64 MIL/uL (ref 4.22–5.81)
RDW: 14.4 % (ref 11.5–15.5)
WBC: 9.1 10*3/uL (ref 4.0–10.5)
nRBC: 0 % (ref 0.0–0.2)

## 2023-05-28 LAB — IRON AND TIBC
Iron: 34 ug/dL — ABNORMAL LOW (ref 45–182)
Saturation Ratios: 14 % — ABNORMAL LOW (ref 17.9–39.5)
TIBC: 246 ug/dL — ABNORMAL LOW (ref 250–450)
UIBC: 212 ug/dL

## 2023-05-28 LAB — FERRITIN: Ferritin: 593 ng/mL — ABNORMAL HIGH (ref 24–336)

## 2023-05-28 LAB — RETIC PANEL
Immature Retic Fract: 10.5 % (ref 2.3–15.9)
RBC.: 4.62 MIL/uL (ref 4.22–5.81)
Retic Count, Absolute: 60.1 10*3/uL (ref 19.0–186.0)
Retic Ct Pct: 1.3 % (ref 0.4–3.1)
Reticulocyte Hemoglobin: 28.9 pg (ref >27.9)

## 2023-05-28 LAB — HEPATITIS PANEL, ACUTE: Hepatitis B Surface Ag: NONREACTIVE

## 2023-05-28 NOTE — Progress Notes (Signed)
Hematology/Oncology Consult note [phone] [phone]        REFERRING PROVIDER: Emily Filbert, MD   CHIEF COMPLAINTS/REASON FOR VISIT:  Evaluation of abnormal iron levels.   ASSESSMENT & PLAN:   Abnormal blood level of iron Labs are reviewed and discussed with patient. Lab Results  Component Value Date   HGB 12.1 (L) 05/28/2023   TIBC 246 (L) 05/28/2023   IRONPCTSAT 14 (L) 05/28/2023   FERRITIN 593 (H) 05/28/2023    Previous labs were reviewed and discussed with patient.  Repeated iron panel showed decreased iron saturation, elevated ferritin 593.  Reticulocyte panel is 28, borderline. Elevated ferritin is likely secondary to acute or chronic inflammation.  I will check hemochromatosis gene mutations.    Normocytic anemia Patient has borderline iron saturation.  Anemia Patient not interested in GI workup with colonoscopy or Cologuard. Continue to monitor.  Fatigue Multifactorial. Not proportional to his mild anemia with hemoglobin of 12.  Likely secondary to his chronic heart disease, deconditioning, low testosterone level.  TSH has been checked by primary care provider and was normal.   Orders Placed This Encounter  Procedures   Ferritin    Standing Status:   Future    Number of Occurrences:   1    Standing Expiration Date:   11/28/2023   Iron and TIBC    Standing Status:   Future    Number of Occurrences:   1    Standing Expiration Date:   05/27/2024   CBC with Differential/Platelet    Standing Status:   Future    Number of Occurrences:   1    Standing Expiration Date:   05/27/2024   Hemochromatosis DNA-PCR(c282y,h63d)    Standing Status:   Future    Number of Occurrences:   1    Standing Expiration Date:   05/27/2024   Retic Panel    Standing Status:   Future    Number of Occurrences:   1    Standing Expiration Date:   05/27/2024   Hepatitis panel, acute    Standing Status:   Future    Number of Occurrences:   1    Standing  Expiration Date:   05/27/2024   Follow-up  3 to 4 weeks to review results. All questions were answered. The patient knows to call the clinic with any problems, questions or concerns.  Rickard Patience, MD, PhD [facility] Hematology Oncology 05/28/2023   HISTORY OF PRESENTING ILLNESS:   William Woods. is a  77 y.o.  male with PMH listed below was seen in consultation at the request of  Emily Filbert, MD  for evaluation of abnormal iron levels.   Reviewed his blood work that was obtained by primary care provider.  Hb was 12.7, iron saturation 11%, Ferritin 1000.  Patient was accompanied by his wife.  Patient reports fatigue and shortness of breath.  Patient has atrial fibrillation, long QT syndrome, cardiomyopathy. Denies hematochezia, hematuria, hematemesis, epistaxis, black tarry stool or easy bruising.    MEDICAL HISTORY:  Past Medical History:  Diagnosis Date   CHB (complete heart block) (HCC)    Edema    Suspect diastolic heart failure   Hypertension    Long Q-T syndrome    Obesity (BMI 30-39.9)    Obstructive sleep apnea    Pacemaker    Medtronic Adapta L model ADDRL 1 (serial number [id number]) pacemaker.   Syncope    Thyroid disease     SURGICAL HISTORY: Past  Surgical History:  Procedure Laterality Date   BIV UPGRADE N/A 06/16/2019   Procedure: BIVP UPGRADE - BIVPacemake Upgrade;  Surgeon: Duke Salvia, MD;  Location[facility] INVASIVE CV LAB;  Service: Cardiovascular;  Laterality: N/A;   CARDIAC CATHETERIZATION     [facility]    CARDIAC CATHETERIZATION  01/2012   [facility]: no significant CAD, EF 40%.    LOOP RECORDER EXPLANT N/A 02/04/2012   Procedure: LOOP RECORDER EXPLANT;  Surgeon: Hillis Range, MD;  Location: [facility] CATH LAB;  Service: Cardiovascular;  Laterality: N/A;   LOOP RECORDER IMPLANT N/A 01/21/2012   Procedure: LOOP RECORDER IMPLANT;  Surgeon: Duke Salvia, MD;  Location: [facility] CATH LAB;  Service: Cardiovascular;  Laterality: N/A;   OTHER SURGICAL HISTORY  02/04/2012     removal of a previously implanted MDT Reveal XT implantable loop recorder   PACEMAKER INSERTION  02/04/2012   Medtronic Adapta L model ADDRL 1 (serial number [id number]) pacemaker.   PERMANENT PACEMAKER INSERTION N/A 02/04/2012   Procedure: PERMANENT PACEMAKER INSERTION;  Surgeon: Hillis Range, MD;  Location: [facility] CATH LAB;  Service: Cardiovascular;  Laterality: N/A;    SOCIAL HISTORY: Social History   Socioeconomic History   Marital status: Married    Spouse name: Not on file   Number of children: Not on file   Years of education: 10   Highest education level: 10th grade  Occupational History   Occupation: Personnel officer and Dealer: [employer]  Tobacco Use   Smoking status: Former    Current packs/day: 0.00    Average packs/day: 4.0 packs/day for 25.0 years (100.0 ttl pk-yrs)    Types: Cigarettes    Start date: 02/06/1963    Quit date: 02/06/1988    Years since quitting: 35.3   Smokeless tobacco: Former  Building services engineer status: Never Used  Substance and Sexual Activity   Alcohol use: Yes    Alcohol/week: 0.0 standard drinks of alcohol    Comment: occassional--special occassions   Drug use: No   Sexual activity: Not on file  Other Topics Concern   Not on file  Social History Narrative   Lives in [place], with wife. No children, 2 sons deceased. No pets.      Pt owns [employer]. Quit tobacco at age 80. He lives with his wife. Pt is not active..the patient father had a history of long QT and syncope and died at age 64. His mother died at age 85 of cancer.    Social Determinants of Health   Financial Resource Strain: Low Risk  (05/28/2023)   Overall Financial Resource Strain (CARDIA)    Difficulty of Paying Living Expenses: Not very hard  Food Insecurity: No Food Insecurity (05/28/2023)   Hunger Vital Sign    Worried About Running Out of Food in the Last Year: Never true    Ran Out of Food in the Last Year: Never true   Transportation Needs: No Transportation Needs (05/28/2023)   PRAPARE - Administrator, Civil Service (Medical): No    Lack of Transportation (Non-Medical): No  Physical Activity: Inactive (09/23/2017)   Exercise Vital Sign    Days of Exercise per Week: 0 days    Minutes of Exercise per Session: 0 min  Stress: No Stress Concern Present (05/28/2023)   [employer] of Occupational Health - Occupational Stress Questionnaire    Feeling of Stress : Not at all  Social Connections: Socially Integrated (09/23/2017)  Social Advertising account executive [NHANES]    Frequency of Communication with Friends and Family: More than three times a week    Frequency of Social Gatherings with Friends and Family: Once a week    Attends Religious Services: More than 4 times per year    Active Member of [employer] or Organizations: Yes    Attends Engineer, structural: More than 4 times per year    Marital Status: Married  Catering manager Violence: Not At Risk (05/28/2023)   Humiliation, Afraid, Rape, and Kick questionnaire    Fear of Current or Ex-Partner: No    Emotionally Abused: No    Physically Abused: No    Sexually Abused: No    FAMILY HISTORY: Family History  Problem Relation Age of Onset   Kidney disease Father        long QT disease   Arrhythmia Father        Prolong QT   Cancer Mother        leukemia   Arrhythmia Brother        Prolong QT   Bone cancer Son        deceased    ALLERGIES:  is allergic to other.  MEDICATIONS:  Current Outpatient Medications  Medication Sig Dispense Refill   apixaban (ELIQUIS) 5 MG TABS tablet Take 1 tablet (5 mg total) by mouth 2 (two) times daily. 60 tablet 5   Ascorbic Acid (VITAMIN C) 1000 MG tablet Take 1,000 mg by mouth 2 (two) times daily.     Coenzyme Q10 (CO Q-10 PO) Take by mouth.     furosemide (LASIX) 80 MG tablet Take 1 tablet (80 mg total) by mouth daily. 90 tablet 0   losartan (COZAAR) 25 MG tablet Take 1 tablet (25 mg  total) by mouth daily. 90 tablet 1   MAGNESIUM PO Take by mouth.     meloxicam (MOBIC) 15 MG tablet Take 15 mg by mouth daily.     metoprolol succinate (TOPROL-XL) 25 MG 24 hr tablet Take 1 tablet (25 mg total) by mouth daily. 90 tablet 3   OVER THE COUNTER MEDICATION Cherry extract daily     spironolactone (ALDACTONE) 25 MG tablet Take 0.5 tablets (12.5 mg total) by mouth daily. 45 tablet 3   No current facility-administered medications for this visit.    Review of Systems  Constitutional:  Negative for appetite change, chills, fever and unexpected weight change.  HENT:   Negative for hearing loss and voice change.   Eyes:  Negative for eye problems and icterus.  Respiratory:  Positive for shortness of breath. Negative for chest tightness and cough.   Cardiovascular:  Negative for chest pain and leg swelling.  Gastrointestinal:  Negative for abdominal distention, abdominal pain and blood in stool.  Endocrine: Negative for hot flashes.  Genitourinary:  Negative for difficulty urinating, dysuria and frequency.   Musculoskeletal:  Negative for arthralgias.  Skin:  Negative for itching and rash.  Neurological:  Negative for light-headedness and numbness.  Hematological:  Negative for adenopathy. Does not bruise/bleed easily.  Psychiatric/Behavioral:  Negative for confusion.    PHYSICAL EXAMINATION: ECOG PERFORMANCE STATUS: 2 - Symptomatic, <50% confined to bed Vitals:   05/28/23 1507  BP: (!) 127/58  Pulse: 63  Resp: 18  Temp: (!) 96.2 F (35.7 C)  SpO2: 97%   Filed Weights   05/28/23 1507  Weight: 270 lb 9.6 oz (122.7 kg)    Physical Exam Constitutional:      General:  He is not in acute distress.    Appearance: He is obese.     Comments: Patient sits in the wheel chair  HENT:     Head: Normocephalic and atraumatic.  Eyes:     General: No scleral icterus. Cardiovascular:     Rate and Rhythm: Normal rate. Rhythm irregular.  Pulmonary:     Effort: Pulmonary effort is  normal. No respiratory distress.     Breath sounds: No wheezing.  Abdominal:     General: Bowel sounds are normal. There is no distension.     Palpations: Abdomen is soft.  Musculoskeletal:        General: No deformity. Normal range of motion.     Cervical back: Normal range of motion and neck supple.  Skin:    General: Skin is warm and dry.     Findings: No erythema.  Neurological:     Mental Status: He is alert and oriented to person, place, and time. Mental status is at baseline.  Psychiatric:        Mood and Affect: Mood normal.     LABORATORY DATA:  I have reviewed the data as listed    Latest Ref Rng & Units 05/28/2023    3:54 PM 01/30/2023   12:45 PM 05/29/2021   11:22 AM  CBC  WBC 4.0 - 10.5 K/uL 9.1  9.6  7.0   Hemoglobin 13.0 - 17.0 g/dL 82.9  56.2  13.0   Hematocrit 39.0 - 52.0 % 38.6  44.1  45.2   Platelets 150 - 400 K/uL 193  155  128       Latest Ref Rng & Units 01/30/2023   12:45 PM 08/23/2021   11:32 AM 07/17/2021   10:23 AM  CMP  Glucose 70 - 99 mg/dL 865  97  82   BUN 8 - 23 mg/dL 26  22  24    Creatinine 0.61 - 1.24 mg/dL 7.84  6.96  2.95   Sodium 135 - 145 mmol/L 137  142  141   Potassium 3.5 - 5.1 mmol/L 3.9  4.5  4.2   Chloride 98 - 111 mmol/L 100  102  99   CO2 22 - 32 mmol/L 29  26  25    Calcium 8.9 - 10.3 mg/dL 8.7  9.2  9.3       RADIOGRAPHIC STUDIES: I have personally reviewed the radiological images as listed and agreed with the findings in the report. CUP PACEART INCLINIC DEVICE CHECK  Result Date: 05/27/2023 CRT-P device check in clinic. Normal device function. Thresholds, sensing, impedance consistent with previous measurements. Histograms appropriate for patient and level of activity. Freq mode switches d/t high AFib burden. No ventricular high rate episodes. Patient bi-ventricularly pacing 95% of the time. Device programmed with appropriate safety margins. Device heart failure diagnostics are within normal limits and stable over time.  Estimated longevity __4 years_. Patient enrolled in remote follow-up. Plan to check device remotely in 3 months and every 6 months in office.  CT LUMBAR SPINE WO CONTRAST  Result Date: 05/02/2023 CLINICAL DATA:  Right low back pain for 6-7 months. Radiates across 2 frontal groin. History of diffuse idiopathic skeletal hyperostosis. EXAM: CT LUMBAR SPINE WITHOUT CONTRAST TECHNIQUE: Multidetector CT imaging of the lumbar spine was performed without intravenous contrast administration. Multiplanar CT image reconstructions were also generated. RADIATION DOSE REDUCTION: This exam was performed according to the departmental dose-optimization program which includes automated exposure control, adjustment of the mA and/or kV according to patient size  and/or use of iterative reconstruction technique. COMPARISON:  None Available. FINDINGS: Segmentation: Standard; the lowest formed disc space is designated L5-S1. Alignment: There is minimal levocurvature which may be positional. There is no antero or retrolisthesis. Vertebrae: Vertebral body heights are preserved, without evidence of acute fracture. There are flowing anterior osteophytes extending from T12 through L4 consistent with diffuse idiopathic skeletal hyperostosis. There is partial osseous fusion across the L2-L3 disc space as well as fusion across the facet joints at T12 through L4. There is no suspicious osseous lesion. Paraspinal and other soft tissues: Unremarkable. Disc levels: The disc heights are overall preserved. T12-L1: No significant spinal canal or neural foraminal stenosis. L1-L2: Mild right worse than left facet arthropathy without significant spinal canal or neural foraminal stenosis. L2-L3: Mild bilateral neural foraminal stenosis without significant spinal canal or neural foraminal stenosis L3-L4: Mild bilateral facet arthropathy without significant spinal canal or neural foraminal stenosis L4-L5: There is a mild disc bulge, mild endplate spurring,  and mild facet arthropathy resulting in mild bilateral neural foraminal stenosis without significant spinal canal stenosis. L5-S1: There is a minimal disc bulge, mild endplate spurring, and moderate right and mild left facet arthropathy without significant spinal canal or neural foraminal stenosis. IMPRESSION: 1. No acute findings in the lumbar spine. 2. Findings of diffuse idiopathic skeletal hyperostosis in the lower thoracic and upper lumbar spine with partial osseous fusion across the L2-L3 disc space and fusion across the facet joints at T12 through L4. 3. Mild disc bulge and facet arthropathy at L4-L5 resulting in mild bilateral neural foraminal stenosis. No significant spinal canal stenosis at any level. 4. Moderate right and mild left facet arthropathy at L5-S1. Electronically Signed   By: Lesia Hausen M.D.   On: 05/02/2023 08:31   CUP PACEART REMOTE DEVICE CHECK  Result Date: 03/07/2023 Scheduled remote reviewed. Normal device function.  Hx of PAF, currently in progress from 5/16, controlled rates Burden 63.6%, Eliquis per PA report Next remote 91 days. LA, CVRS  ECHOCARDIOGRAM COMPLETE  Result Date: 03/03/2023    ECHOCARDIOGRAM REPORT   Patient Name:   William Woods. Date of Exam: 03/03/2023 Medical Rec #:  [id number]          Height:       72.0 in Accession #:    [id number]         Weight:       293.4 lb Date of Birth:  1946/05/19          BSA:          2.507 m Patient Age:    77 years           BP:           118/65 mmHg Patient Gender: M                  HR:           60 bpm. Exam Location:  [facility] Procedure: 2D Echo, Cardiac Doppler, Color Doppler and Intracardiac            Opacification Agent Indications:    I42.9 Cardiomyopathy (unspecified)  History:        Patient has prior history of Echocardiogram examinations. CHF                 and Cardiomyopathy, Pacemaker, Arrythmias:Atrial Fibrillation,                 Signs/Symptoms:Syncope; Risk Factors:Former Smoker,  Hypertension, Sleep Apnea and Dyslipidemia.  Sonographer:    Rolland Porter Referring Phys: [id number] Duke Salvia  Sonographer Comments: Technically challenging study due to limited acoustic windows, Technically difficult study due to poor echo windows and patient is obese. Image acquisition challenging due to patient body habitus. IMPRESSIONS  1. Left ventricular ejection fraction, by estimation, is 40 to 45%. The left ventricle has mild to moderately decreased function. The left ventricle demonstrates global hypokinesis. There is moderate left ventricular hypertrophy. Left ventricular diastolic parameters are consistent with Grade I diastolic dysfunction (impaired relaxation).  2. Right ventricular systolic function is normal. The right ventricular size is normal.  3. Left atrial size was mildly dilated.  4. The mitral valve is normal in structure. No evidence of mitral valve regurgitation.  5. The aortic valve was not well visualized. Aortic valve regurgitation is trivial.  6. Aortic dilatation noted. There is mild dilatation of the aortic root, measuring 44 mm. There is mild dilatation of the ascending aorta, measuring 41 mm.  7. The inferior vena cava is normal in size with greater than 50% respiratory variability, suggesting right atrial pressure of 3 mmHg. Comparison(s): 07/18/21 (Definity) 40-45%, mod LVH, BAE, Asc ao 40mm. FINDINGS  Left Ventricle: Left ventricular ejection fraction, by estimation, is 40 to 45%. The left ventricle has mild to moderately decreased function. The left ventricle demonstrates global hypokinesis. Definity contrast agent was given IV to delineate the left  ventricular endocardial borders. The left ventricular internal cavity size was normal in size. There is moderate left ventricular hypertrophy. Left ventricular diastolic parameters are consistent with Grade I diastolic dysfunction (impaired relaxation). Right Ventricle: The right ventricular size is normal. No increase in right  ventricular wall thickness. Right ventricular systolic function is normal. Left Atrium: Left atrial size was mildly dilated. Right Atrium: Right atrial size was not well visualized. Pericardium: There is no evidence of pericardial effusion. Mitral Valve: The mitral valve is normal in structure. No evidence of mitral valve regurgitation. Tricuspid Valve: The tricuspid valve is not well visualized. Tricuspid valve regurgitation is not demonstrated. Aortic Valve: The aortic valve was not well visualized. Aortic valve regurgitation is trivial. Aortic valve mean gradient measures 2.0 mmHg. Aortic valve peak gradient measures 3.4 mmHg. Aortic valve area, by VTI measures 4.95 cm. Pulmonic Valve: The pulmonic valve was not well visualized. Pulmonic valve regurgitation is not visualized. Aorta: Aortic dilatation noted. There is mild dilatation of the aortic root, measuring 44 mm. There is mild dilatation of the ascending aorta, measuring 41 mm. Venous: The inferior vena cava was not well visualized. The inferior vena cava is normal in size with greater than 50% respiratory variability, suggesting right atrial pressure of 3 mmHg. IAS/Shunts: The interatrial septum was not well visualized.  LEFT VENTRICLE PLAX 2D LVIDd:         4.90 cm      Diastology LVIDs:         4.30 cm      LV e' medial:    4.79 cm/s LV PW:         1.50 cm      LV E/e' medial:  10.8 LV IVS:        1.90 cm      LV e' lateral:   6.20 cm/s LVOT diam:     2.40 cm      LV E/e' lateral: 8.3 LV SV:         89 LV SV Index:   36 LVOT Area:  4.52 cm  LV Volumes (MOD) LV vol d, MOD A2C: 73.0 ml LV vol d, MOD A4C: 152.0 ml LV vol s, MOD A2C: 42.6 ml LV vol s, MOD A4C: 80.7 ml LV SV MOD A2C:     30.4 ml LV SV MOD A4C:     152.0 ml LV SV MOD BP:      51.8 ml RIGHT VENTRICLE RV S prime:     11.40 cm/s LEFT ATRIUM         Index LA diam:    4.50 cm 1.79 cm/m  AORTIC VALVE                    PULMONIC VALVE AV Area (Vmax):    4.61 cm     PV Vmax:          1.10 m/s AV  Area (Vmean):   4.27 cm     PV Peak grad:     4.8 mmHg AV Area (VTI):     4.95 cm     PR End Diast Vel: 6.45 msec AV Vmax:           91.80 cm/s AV Vmean:          61.400 cm/s AV VTI:            0.180 m AV Peak Grad:      3.4 mmHg AV Mean Grad:      2.0 mmHg LVOT Vmax:         93.50 cm/s LVOT Vmean:        57.900 cm/s LVOT VTI:          0.197 m LVOT/AV VTI ratio: 1.09  AORTA Ao Root diam: 4.40 cm Ao Asc diam:  4.10 cm MITRAL VALVE MV Area (PHT): 2.66 cm    SHUNTS MV Decel Time: 285 msec    Systemic VTI:  0.20 m MV E velocity: 51.60 cm/s  Systemic Diam: 2.40 cm MV A velocity: 81.90 cm/s MV E/A ratio:  0.63 Debbe Odea MD Electronically signed by Debbe Odea MD Signature Date/Time: 03/03/2023/5:19:17 PM    Final

## 2023-05-28 NOTE — Assessment & Plan Note (Signed)
Labs are reviewed and discussed with patient. Lab Results  Component Value Date   HGB 12.1 (L) 05/28/2023   TIBC 246 (L) 05/28/2023   IRONPCTSAT 14 (L) 05/28/2023   FERRITIN 593 (H) 05/28/2023    Previous labs were reviewed and discussed with patient.  Repeated iron panel showed decreased iron saturation, elevated ferritin 593.  Reticulocyte panel is 28, borderline. Elevated ferritin is likely secondary to acute or chronic inflammation.  I will check hemochromatosis gene mutations.

## 2023-05-28 NOTE — Assessment & Plan Note (Addendum)
Multifactorial. Not proportional to his mild anemia with hemoglobin of 12.  Likely secondary to his chronic heart disease, deconditioning, low testosterone level.  TSH has been checked by primary care provider and was normal.

## 2023-05-28 NOTE — Assessment & Plan Note (Signed)
Patient has borderline iron saturation.  Anemia Patient not interested in GI workup with colonoscopy or Cologuard. Continue to monitor.

## 2023-05-29 ENCOUNTER — Encounter: Payer: Self-pay | Admitting: Emergency Medicine

## 2023-06-04 ENCOUNTER — Encounter: Payer: Self-pay | Admitting: Internal Medicine

## 2023-06-04 ENCOUNTER — Ambulatory Visit (INDEPENDENT_AMBULATORY_CARE_PROVIDER_SITE_OTHER): Payer: Medicare Other | Admitting: Internal Medicine

## 2023-06-04 VITALS — BP 118/74 | HR 64 | Temp 98.0°F | Ht 72.0 in | Wt 262.0 lb

## 2023-06-04 DIAGNOSIS — M5441 Lumbago with sciatica, right side: Secondary | ICD-10-CM | POA: Diagnosis not present

## 2023-06-04 DIAGNOSIS — M4810 Ankylosing hyperostosis [Forestier], site unspecified: Secondary | ICD-10-CM | POA: Diagnosis not present

## 2023-06-04 DIAGNOSIS — G4733 Obstructive sleep apnea (adult) (pediatric): Secondary | ICD-10-CM | POA: Diagnosis not present

## 2023-06-04 NOTE — Progress Notes (Signed)
@Patient  ID: William Woods., male    DOB: December 27, 1945, 77 y.o.   MRN: [id number]  CC  follow up OSA   TEST/EVENTS :  11/26/2012-sleep study-AHI 37.42    HPI: 77 year old male former smoker followed for severe obstructive sleep apnea Medical history significant for congestive heart failure.   06/04/2023 Follow up : OSA  Patient has underlying severe obstructive sleep apnea is maintained on nocturnal CPAP.  Patient says he is doing well on CPAP.   CPAP compliance download reviewed in detail with patient Patient would like extra-large mask That his current large mask is causing some significant leaks on certain days However his AHI remains at 11 but his symptoms are well-controlled Patient has a compliance report    At this time patient absolutely needs CPAP therapy for survival  No exacerbation at this time No evidence of heart failure at this time No evidence or signs of infection at this time No respiratory distress No fevers, chills, nausea, vomiting, diarrhea No evidence of lower extremity edema No evidence hemoptysis    Allergies  Allergen Reactions   Other     LONG QT SYNDROME--MANY DRUGS PATIENT HAS TO AVOID SEE WEBSITE FOR DETAILS Http://www.sads.org.uk/drugs_to_avoid.htm     Immunization History  Administered Date(s) Administered   PFIZER(Purple Top)SARS-COV-2 Vaccination 12/17/2019, 01/11/2020    Past Medical History:  Diagnosis Date   CHB (complete heart block) (HCC)    Edema    Suspect diastolic heart failure   Hypertension    Long Q-T syndrome    Obesity (BMI 30-39.9)    Obstructive sleep apnea    Pacemaker    Medtronic Adapta L model ADDRL 1 (serial number [id number]) pacemaker.   Syncope    Thyroid disease     Tobacco History: Social History   Tobacco Use  Smoking Status Former   Current packs/day: 0.00   Average packs/day: 4.0 packs/day for 25.0 years (100.0 ttl pk-yrs)   Types: Cigarettes   Start date: 02/06/1963   Quit date:  02/06/1988   Years since quitting: 35.3  Smokeless Tobacco Former   Counseling given: Not Answered    Outpatient Medications Prior to Visit  Medication Sig Dispense Refill   apixaban (ELIQUIS) 5 MG TABS tablet Take 1 tablet (5 mg total) by mouth 2 (two) times daily. 60 tablet 5   Ascorbic Acid (VITAMIN C) 1000 MG tablet Take 1,000 mg by mouth 2 (two) times daily.     Coenzyme Q10 (CO Q-10 PO) Take by mouth.     furosemide (LASIX) 80 MG tablet Take 1 tablet (80 mg total) by mouth daily. 90 tablet 0   losartan (COZAAR) 25 MG tablet Take 1 tablet (25 mg total) by mouth daily. 90 tablet 1   MAGNESIUM PO Take by mouth.     meloxicam (MOBIC) 15 MG tablet Take 15 mg by mouth daily.     metoprolol succinate (TOPROL-XL) 25 MG 24 hr tablet Take 1 tablet (25 mg total) by mouth daily. 90 tablet 3   OVER THE COUNTER MEDICATION Cherry extract daily     spironolactone (ALDACTONE) 25 MG tablet Take 0.5 tablets (12.5 mg total) by mouth daily. 45 tablet 3   No facility-administered medications prior to visit.        Lab Results:  CBC    Component Value Date/Time   WBC 9.1 05/28/2023 1554   RBC 4.62 05/28/2023 1554   RBC 4.64 05/28/2023 1554   HGB 12.1 (L) 05/28/2023 1554   HGB 14.8 05/29/2021 1122  HCT 38.6 (L) 05/28/2023 1554   HCT 45.2 05/29/2021 1122   PLT 193 05/28/2023 1554   PLT 128 (L) 05/29/2021 1122   MCV 83.2 05/28/2023 1554   MCV 81 05/29/2021 1122   MCV 81 01/20/2012 0311   MCH 26.1 05/28/2023 1554   MCHC 31.3 05/28/2023 1554   RDW 14.4 05/28/2023 1554   RDW 12.7 05/29/2021 1122   RDW 13.9 01/20/2012 0311   LYMPHSABS 2.2 05/28/2023 1554   LYMPHSABS 1.5 05/29/2021 1122   LYMPHSABS 1.9 01/20/2012 0311   MONOABS 1.0 05/28/2023 1554   MONOABS 0.6 01/20/2012 0311   EOSABS 0.2 05/28/2023 1554   EOSABS 0.1 05/29/2021 1122   EOSABS 0.1 01/20/2012 0311   BASOSABS 0.0 05/28/2023 1554   BASOSABS 0.0 05/29/2021 1122   BASOSABS 0.0 01/20/2012 0311    BMET    Component  Value Date/Time   NA 137 01/30/2023 1245   NA 142 08/23/2021 1132   NA 143 01/20/2012 0311   K 3.9 01/30/2023 1245   K 4.1 01/20/2012 0311   CL 100 01/30/2023 1245   CL 107 01/20/2012 0311   CO2 29 01/30/2023 1245   CO2 30 01/20/2012 0311   GLUCOSE 102 (H) 01/30/2023 1245   GLUCOSE 103 (H) 01/20/2012 0311   BUN 26 (H) 01/30/2023 1245   BUN 22 08/23/2021 1132   BUN 21 (H) 01/20/2012 0311   CREATININE 1.05 01/30/2023 1245   CREATININE 1.13 09/23/2017 0935   CALCIUM 8.7 (L) 01/30/2023 1245   CALCIUM 8.5 01/20/2012 0311   GFRNONAA >60 01/30/2023 1245   GFRNONAA 66 09/23/2017 0935   GFRAA >60 05/22/2020 0935   GFRAA 76 09/23/2017 0935    BNP    Component Value Date/Time   BNP 374.0 (H) 04/22/2019 1307    ProBNP    Component Value Date/Time   PROBNP 743.6 (H) 02/04/2012 1632    Imaging: CUP PACEART INCLINIC DEVICE CHECK  Result Date: 05/27/2023 CRT-P device check in clinic. Normal device function. Thresholds, sensing, impedance consistent with previous measurements. Histograms appropriate for patient and level of activity. Freq mode switches d/t high AFib burden. No ventricular high rate episodes. Patient bi-ventricularly pacing 95% of the time. Device programmed with appropriate safety margins. Device heart failure diagnostics are within normal limits and stable over time. Estimated longevity __4 years_. Patient enrolled in remote follow-up. Plan to check device remotely in 3 months and every 6 months in office.    BP 118/74 (BP Location: Left Arm, Cuff Size: Normal)   Pulse 64   Temp 98 F (36.7 C) (Temporal)   Ht 6' (1.829 m)   Wt 262 lb (118.8 kg)   SpO2 95%   BMI 35.53 kg/m      Review of Systems: Gen:  Denies  fever, sweats, chills weight loss  HEENT: Denies blurred vision, double vision, ear pain, eye pain, hearing loss, nose bleeds, sore throat Cardiac:  No dizziness, chest pain or heaviness, chest tightness,edema, No JVD Resp:   No cough, -sputum  production, -shortness of breath,-wheezing, -hemoptysis,  Other:  All other systems negative   Physical Examination:   General Appearance: No distress  EYES PERRLA, EOM intact.   NECK Supple, No JVD Pulmonary: normal breath sounds, No wheezing.  CardiovascularNormal S1,S2.  No m/r/g.   Abdomen: Benign, Soft, non-tender. Neurology UE/LE 5/5 strength, no focal deficits Ext pulses intact, cap refill intact ALL OTHER ROS ARE NEGATIVE   Administration History     None  Assessment & Plan:    77 year old white male morbidly obese with severe underlying sleep apnea  Severe sleep apnea Patient needs CPAP therapy to stay alive Patient has been very compliant AHI reduced to 11 however patient has significant amount of leaks with his current mask Recommend extra-large mask Patient uses and benefits from CPAP therapy   Obesity -recommend significant weight loss -recommend changing diet  Deconditioned state -Recommend increased daily activity and exercise   Hypertension - Sleep apnea can contribute to hypertension, therefore treatment of sleep apnea is important part of hypertension management.  Atrial Fibrillation - Sleep apnea can contribute to Atrial Fibrillation, therefore treatment of sleep apnea is important part of A fib management.    MEDICATION ADJUSTMENTS/LABS AND TESTS ORDERED:  Continue CPAP as prescribed Avoid secondhand smoke Avoid SICK contacts Please Mask when appropriate Please Keep up-to-date with vaccinations  CURRENT MEDICATIONS REVIEWED AT LENGTH WITH PATIENT TODAY   Patient  satisfied with Plan of action and management. All questions answered  Follow up  1 year  Total Time Spent  21 mins    Santiago Glad, M.D.  [facility]  Medical Director [facility] Medical Director [facility] Cardio-Pulmonary Department

## 2023-06-04 NOTE — Patient Instructions (Addendum)
Excellent job A+  Keep up the great work  Continue CPAP as prescribed  Continue weight loss journey-excellent job  Obtain extra-large mask for CPAP compliance

## 2023-06-06 ENCOUNTER — Ambulatory Visit (INDEPENDENT_AMBULATORY_CARE_PROVIDER_SITE_OTHER): Payer: Medicare Other

## 2023-06-06 DIAGNOSIS — I442 Atrioventricular block, complete: Secondary | ICD-10-CM

## 2023-06-06 LAB — CUP PACEART REMOTE DEVICE CHECK
Battery Remaining Longevity: 46 mo
Battery Voltage: 2.94 V
Brady Statistic AP VP Percent: 1.49 %
Brady Statistic AP VS Percent: 0.01 %
Brady Statistic AS VP Percent: 98.18 %
Brady Statistic AS VS Percent: 0.13 %
Brady Statistic RA Percent Paced: 1.08 %
Brady Statistic RV Percent Paced: 98.98 %
Date Time Interrogation Session: 20240816031232
Implantable Lead Connection Status: 753985
Implantable Lead Connection Status: 753985
Implantable Lead Connection Status: 753985
Implantable Lead Implant Date: 20130416
Implantable Lead Implant Date: 20130416
Implantable Lead Implant Date: 20200826
Implantable Lead Location: 753858
Implantable Lead Location: 753859
Implantable Lead Location: 753860
Implantable Lead Model: 5076
Implantable Lead Model: 5076
Implantable Pulse Generator Implant Date: 20200826
Lead Channel Impedance Value: 1064 Ohm
Lead Channel Impedance Value: 1083 Ohm
Lead Channel Impedance Value: 1235 Ohm
Lead Channel Impedance Value: 1254 Ohm
Lead Channel Impedance Value: 1349 Ohm
Lead Channel Impedance Value: 304 Ohm
Lead Channel Impedance Value: 323 Ohm
Lead Channel Impedance Value: 342 Ohm
Lead Channel Impedance Value: 361 Ohm
Lead Channel Impedance Value: 627 Ohm
Lead Channel Impedance Value: 627 Ohm
Lead Channel Impedance Value: 665 Ohm
Lead Channel Impedance Value: 798 Ohm
Lead Channel Impedance Value: 988 Ohm
Lead Channel Pacing Threshold Amplitude: 0.875 V
Lead Channel Pacing Threshold Amplitude: 1.125 V
Lead Channel Pacing Threshold Amplitude: 3 V
Lead Channel Pacing Threshold Pulse Width: 0.4 ms
Lead Channel Pacing Threshold Pulse Width: 0.4 ms
Lead Channel Pacing Threshold Pulse Width: 1 ms
Lead Channel Sensing Intrinsic Amplitude: 3.375 mV
Lead Channel Sensing Intrinsic Amplitude: 3.375 mV
Lead Channel Sensing Intrinsic Amplitude: 6.75 mV
Lead Channel Sensing Intrinsic Amplitude: 6.75 mV
Lead Channel Setting Pacing Amplitude: 2.5 V
Lead Channel Setting Pacing Amplitude: 2.5 V
Lead Channel Setting Pacing Amplitude: 2.5 V
Lead Channel Setting Pacing Pulse Width: 0.4 ms
Lead Channel Setting Pacing Pulse Width: 1 ms
Lead Channel Setting Sensing Sensitivity: 4 mV
Zone Setting Status: 755011
Zone Setting Status: 755011

## 2023-06-07 ENCOUNTER — Other Ambulatory Visit: Payer: Self-pay | Admitting: Internal Medicine

## 2023-06-10 DIAGNOSIS — M4810 Ankylosing hyperostosis [Forestier], site unspecified: Secondary | ICD-10-CM | POA: Diagnosis not present

## 2023-06-10 DIAGNOSIS — M5441 Lumbago with sciatica, right side: Secondary | ICD-10-CM | POA: Diagnosis not present

## 2023-06-13 DIAGNOSIS — M5441 Lumbago with sciatica, right side: Secondary | ICD-10-CM | POA: Diagnosis not present

## 2023-06-13 DIAGNOSIS — M4810 Ankylosing hyperostosis [Forestier], site unspecified: Secondary | ICD-10-CM | POA: Diagnosis not present

## 2023-06-14 ENCOUNTER — Other Ambulatory Visit: Payer: Self-pay

## 2023-06-14 ENCOUNTER — Encounter: Admission: EM | Disposition: E | Payer: Self-pay | Source: Home / Self Care | Attending: Internal Medicine

## 2023-06-14 ENCOUNTER — Emergency Department: Payer: Medicare Other

## 2023-06-14 ENCOUNTER — Inpatient Hospital Stay: Payer: Medicare Other

## 2023-06-14 ENCOUNTER — Inpatient Hospital Stay
Admission: EM | Admit: 2023-06-14 | Discharge: 2023-06-22 | DRG: 286 | Disposition: E | Payer: Medicare Other | Attending: Internal Medicine | Admitting: Internal Medicine

## 2023-06-14 DIAGNOSIS — Z791 Long term (current) use of non-steroidal anti-inflammatories (NSAID): Secondary | ICD-10-CM

## 2023-06-14 DIAGNOSIS — G4733 Obstructive sleep apnea (adult) (pediatric): Secondary | ICD-10-CM | POA: Diagnosis present

## 2023-06-14 DIAGNOSIS — R404 Transient alteration of awareness: Secondary | ICD-10-CM | POA: Diagnosis not present

## 2023-06-14 DIAGNOSIS — Z1152 Encounter for screening for COVID-19: Secondary | ICD-10-CM

## 2023-06-14 DIAGNOSIS — I472 Ventricular tachycardia, unspecified: Secondary | ICD-10-CM | POA: Diagnosis present

## 2023-06-14 DIAGNOSIS — R739 Hyperglycemia, unspecified: Secondary | ICD-10-CM | POA: Diagnosis present

## 2023-06-14 DIAGNOSIS — I480 Paroxysmal atrial fibrillation: Secondary | ICD-10-CM | POA: Diagnosis present

## 2023-06-14 DIAGNOSIS — I213 ST elevation (STEMI) myocardial infarction of unspecified site: Secondary | ICD-10-CM | POA: Diagnosis not present

## 2023-06-14 DIAGNOSIS — Z515 Encounter for palliative care: Secondary | ICD-10-CM

## 2023-06-14 DIAGNOSIS — R918 Other nonspecific abnormal finding of lung field: Secondary | ICD-10-CM | POA: Diagnosis not present

## 2023-06-14 DIAGNOSIS — I517 Cardiomegaly: Secondary | ICD-10-CM | POA: Diagnosis not present

## 2023-06-14 DIAGNOSIS — E785 Hyperlipidemia, unspecified: Secondary | ICD-10-CM | POA: Diagnosis present

## 2023-06-14 DIAGNOSIS — G931 Anoxic brain damage, not elsewhere classified: Secondary | ICD-10-CM | POA: Diagnosis not present

## 2023-06-14 DIAGNOSIS — Z6835 Body mass index (BMI) 35.0-35.9, adult: Secondary | ICD-10-CM

## 2023-06-14 DIAGNOSIS — N170 Acute kidney failure with tubular necrosis: Secondary | ICD-10-CM | POA: Diagnosis present

## 2023-06-14 DIAGNOSIS — R58 Hemorrhage, not elsewhere classified: Secondary | ICD-10-CM | POA: Diagnosis not present

## 2023-06-14 DIAGNOSIS — I4891 Unspecified atrial fibrillation: Secondary | ICD-10-CM | POA: Diagnosis not present

## 2023-06-14 DIAGNOSIS — Z4682 Encounter for fitting and adjustment of non-vascular catheter: Secondary | ICD-10-CM | POA: Diagnosis not present

## 2023-06-14 DIAGNOSIS — Z841 Family history of disorders of kidney and ureter: Secondary | ICD-10-CM

## 2023-06-14 DIAGNOSIS — I442 Atrioventricular block, complete: Secondary | ICD-10-CM | POA: Diagnosis present

## 2023-06-14 DIAGNOSIS — I110 Hypertensive heart disease with heart failure: Secondary | ICD-10-CM | POA: Diagnosis present

## 2023-06-14 DIAGNOSIS — Z87891 Personal history of nicotine dependence: Secondary | ICD-10-CM

## 2023-06-14 DIAGNOSIS — R570 Cardiogenic shock: Secondary | ICD-10-CM | POA: Diagnosis present

## 2023-06-14 DIAGNOSIS — K7200 Acute and subacute hepatic failure without coma: Secondary | ICD-10-CM | POA: Diagnosis present

## 2023-06-14 DIAGNOSIS — Z808 Family history of malignant neoplasm of other organs or systems: Secondary | ICD-10-CM

## 2023-06-14 DIAGNOSIS — J9601 Acute respiratory failure with hypoxia: Secondary | ICD-10-CM | POA: Diagnosis not present

## 2023-06-14 DIAGNOSIS — M109 Gout, unspecified: Secondary | ICD-10-CM | POA: Diagnosis present

## 2023-06-14 DIAGNOSIS — R7303 Prediabetes: Secondary | ICD-10-CM | POA: Diagnosis present

## 2023-06-14 DIAGNOSIS — J9602 Acute respiratory failure with hypercapnia: Secondary | ICD-10-CM | POA: Diagnosis present

## 2023-06-14 DIAGNOSIS — I462 Cardiac arrest due to underlying cardiac condition: Secondary | ICD-10-CM | POA: Diagnosis present

## 2023-06-14 DIAGNOSIS — M96A3 Multiple fractures of ribs associated with chest compression and cardiopulmonary resuscitation: Secondary | ICD-10-CM | POA: Diagnosis present

## 2023-06-14 DIAGNOSIS — E669 Obesity, unspecified: Secondary | ICD-10-CM | POA: Diagnosis present

## 2023-06-14 DIAGNOSIS — I2510 Atherosclerotic heart disease of native coronary artery without angina pectoris: Secondary | ICD-10-CM | POA: Diagnosis present

## 2023-06-14 DIAGNOSIS — Z950 Presence of cardiac pacemaker: Secondary | ICD-10-CM

## 2023-06-14 DIAGNOSIS — I4901 Ventricular fibrillation: Principal | ICD-10-CM | POA: Diagnosis present

## 2023-06-14 DIAGNOSIS — I428 Other cardiomyopathies: Secondary | ICD-10-CM | POA: Diagnosis present

## 2023-06-14 DIAGNOSIS — I255 Ischemic cardiomyopathy: Secondary | ICD-10-CM | POA: Diagnosis present

## 2023-06-14 DIAGNOSIS — Z806 Family history of leukemia: Secondary | ICD-10-CM

## 2023-06-14 DIAGNOSIS — J690 Pneumonitis due to inhalation of food and vomit: Secondary | ICD-10-CM | POA: Diagnosis not present

## 2023-06-14 DIAGNOSIS — Z555 Less than a high school diploma: Secondary | ICD-10-CM

## 2023-06-14 DIAGNOSIS — R55 Syncope and collapse: Secondary | ICD-10-CM | POA: Diagnosis not present

## 2023-06-14 DIAGNOSIS — Z66 Do not resuscitate: Secondary | ICD-10-CM | POA: Diagnosis present

## 2023-06-14 DIAGNOSIS — Z7901 Long term (current) use of anticoagulants: Secondary | ICD-10-CM

## 2023-06-14 DIAGNOSIS — I469 Cardiac arrest, cause unspecified: Secondary | ICD-10-CM | POA: Diagnosis present

## 2023-06-14 DIAGNOSIS — Z79899 Other long term (current) drug therapy: Secondary | ICD-10-CM

## 2023-06-14 DIAGNOSIS — I700 Atherosclerosis of aorta: Secondary | ICD-10-CM | POA: Diagnosis not present

## 2023-06-14 DIAGNOSIS — I5022 Chronic systolic (congestive) heart failure: Secondary | ICD-10-CM | POA: Diagnosis present

## 2023-06-14 DIAGNOSIS — E872 Acidosis, unspecified: Secondary | ICD-10-CM | POA: Diagnosis present

## 2023-06-14 DIAGNOSIS — I6782 Cerebral ischemia: Secondary | ICD-10-CM | POA: Diagnosis not present

## 2023-06-14 DIAGNOSIS — Z888 Allergy status to other drugs, medicaments and biological substances status: Secondary | ICD-10-CM

## 2023-06-14 HISTORY — PX: LEFT HEART CATH AND CORONARY ANGIOGRAPHY: CATH118249

## 2023-06-14 LAB — BLOOD GAS, ARTERIAL
Acid-base deficit: 6.8 mmol/L — ABNORMAL HIGH (ref 0.0–2.0)
Bicarbonate: 22.5 mmol/L (ref 20.0–28.0)
FIO2: 90 %
MECHVT: 500 mL
Mechanical Rate: 16
O2 Saturation: 92.3 %
PEEP: 5 cmH2O
Patient temperature: 37
pCO2 arterial: 63 mmHg — ABNORMAL HIGH (ref 32–48)
pH, Arterial: 7.16 — CL (ref 7.35–7.45)
pO2, Arterial: 68 mmHg — ABNORMAL LOW (ref 83–108)

## 2023-06-14 LAB — CBC
HCT: 35.6 % — ABNORMAL LOW (ref 39.0–52.0)
Hemoglobin: 11.1 g/dL — ABNORMAL LOW (ref 13.0–17.0)
MCH: 26.1 pg (ref 26.0–34.0)
MCHC: 31.2 g/dL (ref 30.0–36.0)
MCV: 83.6 fL (ref 80.0–100.0)
Platelets: 309 10*3/uL (ref 150–400)
RBC: 4.26 MIL/uL (ref 4.22–5.81)
RDW: 14.1 % (ref 11.5–15.5)
WBC: 26.7 10*3/uL — ABNORMAL HIGH (ref 4.0–10.5)
nRBC: 0 % (ref 0.0–0.2)

## 2023-06-14 LAB — POCT I-STAT 7, (LYTES, BLD GAS, ICA,H+H)
Acid-base deficit: 5 mmol/L — ABNORMAL HIGH (ref 0.0–2.0)
Bicarbonate: 22.3 mmol/L (ref 20.0–28.0)
Calcium, Ion: 1.35 mmol/L (ref 1.15–1.40)
HCT: 34 % — ABNORMAL LOW (ref 39.0–52.0)
Hemoglobin: 11.6 g/dL — ABNORMAL LOW (ref 13.0–17.0)
O2 Saturation: 92 %
Potassium: 3 mmol/L — ABNORMAL LOW (ref 3.5–5.1)
Sodium: 132 mmol/L — ABNORMAL LOW (ref 135–145)
TCO2: 24 mmol/L (ref 22–32)
pCO2 arterial: 52.7 mmHg — ABNORMAL HIGH (ref 32–48)
pH, Arterial: 7.234 — ABNORMAL LOW (ref 7.35–7.45)
pO2, Arterial: 77 mmHg — ABNORMAL LOW (ref 83–108)

## 2023-06-14 LAB — COMPREHENSIVE METABOLIC PANEL
ALT: 142 U/L — ABNORMAL HIGH (ref 0–44)
AST: 164 U/L — ABNORMAL HIGH (ref 15–41)
Albumin: 2.8 g/dL — ABNORMAL LOW (ref 3.5–5.0)
Alkaline Phosphatase: 78 U/L (ref 38–126)
Anion gap: 16 — ABNORMAL HIGH (ref 5–15)
BUN: 19 mg/dL (ref 8–23)
CO2: 22 mmol/L (ref 22–32)
Calcium: 8.7 mg/dL — ABNORMAL LOW (ref 8.9–10.3)
Chloride: 98 mmol/L (ref 98–111)
Creatinine, Ser: 1.34 mg/dL — ABNORMAL HIGH (ref 0.61–1.24)
GFR, Estimated: 55 mL/min — ABNORMAL LOW (ref >60)
Glucose, Bld: 265 mg/dL — ABNORMAL HIGH (ref 70–99)
Potassium: 4.6 mmol/L (ref 3.5–5.1)
Sodium: 136 mmol/L (ref 135–145)
Total Bilirubin: 1.7 mg/dL — ABNORMAL HIGH (ref 0.3–1.2)
Total Protein: 6.6 g/dL (ref 6.5–8.1)

## 2023-06-14 LAB — MAGNESIUM
Magnesium: 2.3 mg/dL (ref 1.7–2.4)
Magnesium: 2.3 mg/dL (ref 1.7–2.4)

## 2023-06-14 LAB — PHOSPHORUS: Phosphorus: 6.3 mg/dL — ABNORMAL HIGH (ref 2.5–4.6)

## 2023-06-14 LAB — PROTIME-INR
INR: 1.5 — ABNORMAL HIGH (ref 0.8–1.2)
Prothrombin Time: 18.2 seconds — ABNORMAL HIGH (ref 11.4–15.2)

## 2023-06-14 LAB — CBC WITH DIFFERENTIAL/PLATELET
Abs Immature Granulocytes: 0.56 10*3/uL — ABNORMAL HIGH (ref 0.00–0.07)
Basophils Absolute: 0.1 10*3/uL (ref 0.0–0.1)
Basophils Relative: 0 %
Eosinophils Absolute: 0.1 10*3/uL (ref 0.0–0.5)
Eosinophils Relative: 1 %
HCT: 36.7 % — ABNORMAL LOW (ref 39.0–52.0)
Hemoglobin: 11.4 g/dL — ABNORMAL LOW (ref 13.0–17.0)
Immature Granulocytes: 3 %
Lymphocytes Relative: 27 %
Lymphs Abs: 6 10*3/uL — ABNORMAL HIGH (ref 0.7–4.0)
MCH: 26.1 pg (ref 26.0–34.0)
MCHC: 31.1 g/dL (ref 30.0–36.0)
MCV: 84 fL (ref 80.0–100.0)
Monocytes Absolute: 1.7 10*3/uL — ABNORMAL HIGH (ref 0.1–1.0)
Monocytes Relative: 8 %
Neutro Abs: 13.4 10*3/uL — ABNORMAL HIGH (ref 1.7–7.7)
Neutrophils Relative %: 61 %
Platelets: 282 10*3/uL (ref 150–400)
RBC: 4.37 MIL/uL (ref 4.22–5.81)
RDW: 14.2 % (ref 11.5–15.5)
Smear Review: NORMAL
WBC: 22 10*3/uL — ABNORMAL HIGH (ref 4.0–10.5)
nRBC: 0 % (ref 0.0–0.2)

## 2023-06-14 LAB — TROPONIN I (HIGH SENSITIVITY)
Troponin I (High Sensitivity): 99 ng/L — ABNORMAL HIGH (ref <18)
Troponin I (High Sensitivity): 653 ng/L (ref <18)

## 2023-06-14 LAB — APTT: aPTT: 35 seconds (ref 24–36)

## 2023-06-14 LAB — HEPARIN LEVEL (UNFRACTIONATED): Heparin Unfractionated: >1.1 [IU]/mL — ABNORMAL HIGH (ref 0.30–0.70)

## 2023-06-14 LAB — BRAIN NATRIURETIC PEPTIDE: B Natriuretic Peptide: 108.9 pg/mL — ABNORMAL HIGH (ref 0.0–100.0)

## 2023-06-14 SURGERY — LEFT HEART CATH AND CORONARY ANGIOGRAPHY
Anesthesia: Moderate Sedation

## 2023-06-14 MED ORDER — ETOMIDATE 2 MG/ML IV SOLN
INTRAVENOUS | Status: AC | PRN
  Administered 2023-06-14: 20 mg via INTRAVENOUS

## 2023-06-14 MED ORDER — HEPARIN (PORCINE) IN NACL 1000-0.9 UT/500ML-% IV SOLN
INTRAVENOUS | Status: DC | PRN
  Administered 2023-06-14 (×2): 500 mL

## 2023-06-14 MED ORDER — AMIODARONE HCL IN DEXTROSE 360-4.14 MG/200ML-% IV SOLN: 30.0000 mg/h | INTRAVENOUS | Status: DC

## 2023-06-14 MED ORDER — SODIUM CHLORIDE 0.9 % IV SOLN: 250.0000 mL | INTRAVENOUS | Status: DC | PRN

## 2023-06-14 MED ORDER — ACETAMINOPHEN 325 MG PO TABS: 650.0000 mg | ORAL_TABLET | ORAL | Status: DC | PRN

## 2023-06-14 MED ORDER — NOREPINEPHRINE BITARTRATE 1 MG/ML IV SOLN
INTRAVENOUS | Status: AC | PRN
  Administered 2023-06-14: 5 ug/min via INTRAVENOUS

## 2023-06-14 MED ORDER — FENTANYL CITRATE (PF) 100 MCG/2ML IJ SOLN
INTRAMUSCULAR | Status: AC | PRN
  Administered 2023-06-14: 100 ug via INTRAVENOUS

## 2023-06-14 MED ORDER — MORPHINE SULFATE (PF) 2 MG/ML IV SOLN
2.0000 mg | INTRAVENOUS | Status: DC | PRN
  Administered 2023-06-14: 2 mg via INTRAVENOUS
  Filled 2023-06-14: qty 1

## 2023-06-14 MED ORDER — NOREPINEPHRINE 4 MG/250ML-% IV SOLN
INTRAVENOUS | Status: AC
  Administered 2023-06-14: 4 mg
  Filled 2023-06-14: qty 250

## 2023-06-14 MED ORDER — GLYCOPYRROLATE 1 MG PO TABS: 1.0000 mg | ORAL_TABLET | ORAL | Status: DC | PRN

## 2023-06-14 MED ORDER — DOCUSATE SODIUM 100 MG PO CAPS: 100.0000 mg | ORAL_CAPSULE | Freq: Two times a day (BID) | ORAL | Status: DC | PRN

## 2023-06-14 MED ORDER — MIDAZOLAM HCL 2 MG/2ML IJ SOLN
INTRAMUSCULAR | Status: AC
  Filled 2023-06-14: qty 2

## 2023-06-14 MED ORDER — GLYCOPYRROLATE 0.2 MG/ML IJ SOLN: 0.2000 mg | INTRAMUSCULAR | Status: DC | PRN

## 2023-06-14 MED ORDER — AMIODARONE HCL IN DEXTROSE 360-4.14 MG/200ML-% IV SOLN
INTRAVENOUS | Status: AC
  Administered 2023-06-14: 60 mg/h via INTRAVENOUS
  Filled 2023-06-14: qty 200

## 2023-06-14 MED ORDER — SODIUM CHLORIDE 0.9% FLUSH: 3.0000 mL | Freq: Two times a day (BID) | INTRAVENOUS | Status: DC

## 2023-06-14 MED ORDER — HEPARIN (PORCINE) 25000 UT/250ML-% IV SOLN: 1500.0000 [IU]/h | INTRAVENOUS | Status: DC

## 2023-06-14 MED ORDER — FENTANYL CITRATE (PF) 100 MCG/2ML IJ SOLN
INTRAMUSCULAR | Status: AC
  Filled 2023-06-14: qty 2

## 2023-06-14 MED ORDER — AMIODARONE IV BOLUS ONLY 150 MG/100ML
INTRAVENOUS | Status: AC
  Filled 2023-06-14: qty 100

## 2023-06-14 MED ORDER — LIDOCAINE HCL (PF) 1 % IJ SOLN
INTRAMUSCULAR | Status: DC | PRN
  Administered 2023-06-14: 15 mL

## 2023-06-14 MED ORDER — SODIUM CHLORIDE 0.9% FLUSH: 3.0000 mL | INTRAVENOUS | Status: DC | PRN

## 2023-06-14 MED ORDER — POLYVINYL ALCOHOL 1.4 % OP SOLN: 1.0000 [drp] | Freq: Four times a day (QID) | OPHTHALMIC | Status: DC | PRN

## 2023-06-14 MED ORDER — LIDOCAINE HCL 1 % IJ SOLN
INTRAMUSCULAR | Status: AC
  Filled 2023-06-14: qty 20

## 2023-06-14 MED ORDER — SODIUM CHLORIDE 0.9 % IV SOLN
3.0000 g | Freq: Four times a day (QID) | INTRAVENOUS | Status: DC
  Administered 2023-06-14: 3 g via INTRAVENOUS
  Filled 2023-06-14 (×2): qty 8

## 2023-06-14 MED ORDER — MIDAZOLAM HCL 2 MG/2ML IJ SOLN: 2.0000 mg | INTRAMUSCULAR | Status: DC | PRN

## 2023-06-14 MED ORDER — LIDOCAINE HCL (CARDIAC) PF 100 MG/5ML IV SOSY
PREFILLED_SYRINGE | INTRAVENOUS | Status: AC | PRN
  Administered 2023-06-14: 100 mg via INTRAVENOUS

## 2023-06-14 MED ORDER — ACETAMINOPHEN 650 MG RE SUPP: 650.0000 mg | Freq: Four times a day (QID) | RECTAL | Status: DC | PRN

## 2023-06-14 MED ORDER — IOHEXOL 300 MG/ML  SOLN
INTRAMUSCULAR | Status: DC | PRN
  Administered 2023-06-14: 57 mL

## 2023-06-14 MED ORDER — HEPARIN BOLUS VIA INFUSION
4000.0000 [IU] | Freq: Once | INTRAVENOUS | Status: DC
Start: 2023-06-14 — End: 2023-06-14

## 2023-06-14 MED ORDER — EPINEPHRINE 1 MG/10ML IJ SOSY
PREFILLED_SYRINGE | INTRAMUSCULAR | Status: AC | PRN
Start: 2023-06-14 — End: 2023-06-14
  Administered 2023-06-14: 1 mg via INTRAVENOUS

## 2023-06-14 MED ORDER — POLYETHYLENE GLYCOL 3350 17 G PO PACK: 17.0000 g | PACK | Freq: Every day | ORAL | Status: DC | PRN

## 2023-06-14 MED ORDER — ROCURONIUM BROMIDE 50 MG/5ML IV SOLN
INTRAVENOUS | Status: AC | PRN
Start: 2023-06-14 — End: 2023-06-14
  Administered 2023-06-14: 100 mg via INTRAVENOUS

## 2023-06-14 MED ORDER — HEPARIN (PORCINE) IN NACL 1000-0.9 UT/500ML-% IV SOLN
INTRAVENOUS | Status: AC
  Filled 2023-06-14: qty 1000

## 2023-06-14 MED ORDER — ACETAMINOPHEN 325 MG PO TABS: 650.0000 mg | ORAL_TABLET | Freq: Four times a day (QID) | ORAL | Status: DC | PRN

## 2023-06-14 MED ORDER — SODIUM CHLORIDE 0.9 % IV SOLN: INTRAVENOUS | Status: DC

## 2023-06-14 MED ORDER — IOHEXOL 350 MG/ML SOLN
100.0000 mL | Freq: Once | INTRAVENOUS | Status: AC | PRN
  Administered 2023-06-14: 100 mL via INTRAVENOUS

## 2023-06-14 MED ORDER — CALCIUM CHLORIDE 10 % IV SOLN
INTRAVENOUS | Status: AC | PRN
  Administered 2023-06-14: 1 g via INTRAVENOUS

## 2023-06-14 MED ORDER — AMIODARONE HCL IN DEXTROSE 360-4.14 MG/200ML-% IV SOLN: 60.0000 mg/h | INTRAVENOUS | Status: DC

## 2023-06-14 MED ORDER — POTASSIUM CHLORIDE 10 MEQ/100ML IV SOLN
10.0000 meq | INTRAVENOUS | Status: AC
  Administered 2023-06-14 (×2): 10 meq via INTRAVENOUS
  Filled 2023-06-14 (×2): qty 100

## 2023-06-14 SURGICAL SUPPLY — 12 items
CANNULA 5F STIFF (CANNULA) IMPLANT
CATH INFINITI 5FR MULTPACK ANG (CATHETERS) IMPLANT
DEVICE CLOSURE MYNXGRIP 5F (Vascular Products) IMPLANT
DRAPE BRACHIAL (DRAPES) IMPLANT
PACK CARDIAC CATH (CUSTOM PROCEDURE TRAY) ×2 IMPLANT
PROTECTION STATION PRESSURIZED (MISCELLANEOUS) ×1
SET ATX-X65L (MISCELLANEOUS) IMPLANT
SHEATH AVANTI 5FR X 11CM (SHEATH) IMPLANT
SHEATH AVANTI 6FR X 11CM (SHEATH) IMPLANT
STATION PROTECTION PRESSURIZED (MISCELLANEOUS) IMPLANT
TUBING CIL FLEX 10 FLL-RA (TUBING) IMPLANT
WIRE GUIDERIGHT .035X150 (WIRE) IMPLANT

## 2023-06-16 ENCOUNTER — Encounter: Payer: Self-pay | Admitting: Cardiovascular Disease

## 2023-06-16 LAB — GLUCOSE, CAPILLARY: Glucose-Capillary: 314 mg/dL — ABNORMAL HIGH (ref 70–99)

## 2023-06-17 NOTE — Progress Notes (Signed)
Remote pacemaker transmission.   

## 2023-06-19 ENCOUNTER — Telehealth: Payer: Medicare Other | Admitting: Oncology

## 2023-06-19 LAB — CULTURE, BLOOD (ROUTINE X 2)
Culture: NO GROWTH
Culture: NO GROWTH
Special Requests: ADEQUATE

## 2023-06-22 NOTE — ED Notes (Signed)
CPR started @0221 . Shock 200j @0222 . Pulse check @0223 . ROSC@0223 . Shock 200j @0224 . Lidocaine @0226 .

## 2023-06-22 NOTE — Consult Note (Signed)
Cardiology Consultation   Patient ID: William Woods. MRN: [id number]; DOB: 1946-05-20  Admit date: 05/26/2023 Date of Consult: 06/01/2023  PCP:  William Woods   [facility] Providers Cardiologist:  Sherryl Manges, MD  Electrophysiologist:  Sherryl Manges, MD  {   Patient Profile:   William Woods. is a 77 y.o. male with a hx of chronic systolic heart failure who is being seen 05/23/2023 for the evaluation of cardiac arrest at the request of Dr. Katrinka Blazing.  History of Present Illness:   William Woods is a 77 year old male with history of complete heart block status post dual-chamber pacemaker placement, chronic systolic heart failure due to nonischemic cardiomyopathy with previous cardiac catheterization 2013 showing no significant coronary artery disease, obstructive sleep apnea on CPAP, paroxysmal atrial fibrillation on Eliquis anticoagulation, essential hypertension and long QT syndrome. The patient was being evaluated for A-fib ablation recently with possible Watchman device placement.  However, he had significant deterioration of shortness of breath, fatigue as well as low back pain and thus the procedure was canceled.  According to his wife, he continued to feel poorly and has been limited by his back pain.  He took a supplement for gout last night and overall was not feeling well.  He never complained of chest pain but was visibly out of breath and became unconscious.  His wife called EMS but did not perform CPR.  EMS arrived within 4 minutes and the patient was pulseless in V-fib.  He was defibrillated 3 times and given epinephrine x 1 and amiodarone bolus with ROSC after the third defibrillation.  In the ED, he was transitioned to an ET tube and was given etomidate and fentanyl.  He became pulseless again and CPR was initiated.  He was defibrillated once for sustained ventricular tachycardia.  His EKGs did not show convincing evidence of STEMI but given recurrent ventricular  fibrillation and ventricular tachycardia, I recommended proceeding with urgent cardiac catheterization.   Past Medical History:  Diagnosis Date   CHB (complete heart block) (HCC)    Edema    Suspect diastolic heart failure   Hypertension    Long Q-T syndrome    Obesity (BMI 30-39.9)    Obstructive sleep apnea    Pacemaker    Medtronic Adapta L model ADDRL 1 (serial number [id number]) pacemaker.   Syncope    Thyroid disease     Past Surgical History:  Procedure Laterality Date   BIV UPGRADE N/A 06/16/2019   Procedure: BIVP UPGRADE - BIVPacemake Upgrade;  Surgeon: Duke Salvia, MD;  Location[facility]VASIVE CV LAB;  Service: Cardiovascular;  Laterality: N/A;   CARDIAC CATHETERIZATION     [facility]    CARDIAC CATHETERIZATION  01/2012   [facility]: no significant CAD, EF 40%.    LOOP RECORDER EXPLANT N/A 02/04/2012   Procedure: LOOP RECORDER EXPLANT;  Surgeon: Hillis Range, MD;  Location: [facility] CATH LAB;  Service: Cardiovascular;  Laterality: N/A;   LOOP RECORDER IMPLANT N/A 01/21/2012   Procedure: LOOP RECORDER IMPLANT;  Surgeon: Duke Salvia, MD;  Location: [facility] CATH LAB;  Service: Cardiovascular;  Laterality: N/A;   OTHER SURGICAL HISTORY  02/04/2012    removal of a previously implanted MDT Reveal XT implantable loop recorder   PACEMAKER INSERTION  02/04/2012   Medtronic Adapta L model ADDRL 1 (serial number [id number]) pacemaker.   PERMANENT PACEMAKER INSERTION N/A 02/04/2012   Procedure: PERMANENT PACEMAKER INSERTION;  Surgeon: Hillis Range, MD;  Location: [facility] CATH LAB;  Service:  Cardiovascular;  Laterality: N/A;     Home Medications:  Prior to Admission medications   Medication Sig Start Date End Date Taking? Authorizing Provider  apixaban (ELIQUIS) 5 MG TABS tablet Take 1 tablet (5 mg total) by mouth 2 (two) times daily. 11/25/22   Duke Salvia, MD  Ascorbic Acid (VITAMIN C) 1000 MG tablet Take 1,000 mg by mouth 2 (two) times daily.    [provider]  Coenzyme Q10 (CO Q-10 PO)  Take by mouth.    [provider]  furosemide (LASIX) 80 MG tablet Take 1 tablet (80 mg total) by mouth daily. 06/10/23 09/08/23  Duke Salvia, MD  losartan (COZAAR) 25 MG tablet Take 1 tablet (25 mg total) by mouth daily. 01/30/23   Duke Salvia, MD  MAGNESIUM PO Take by mouth. Patient not taking: Reported on 06/04/2023    [provider]  meloxicam (MOBIC) 15 MG tablet Take 15 mg by mouth daily. 04/11/21   [provider]  metoprolol succinate (TOPROL-XL) 25 MG 24 hr tablet Take 1 tablet (25 mg total) by mouth daily. 02/03/23   Duke Salvia, MD  OVER THE COUNTER MEDICATION Cherry extract daily    [provider]  spironolactone (ALDACTONE) 25 MG tablet Take 0.5 tablets (12.5 mg total) by mouth daily. 01/30/23   Duke Salvia, MD    Inpatient Medications: Scheduled Meds:  Continuous Infusions:  amiodarone 60 mg/hr (06/19/2023 0238)   Followed by   amiodarone     norepinephrine Stopped (05/27/2023 0241)   potassium chloride     PRN Meds: docusate sodium, norepinephrine, polyethylene glycol  Allergies:    Allergies  Allergen Reactions   Other     LONG QT SYNDROME--MANY DRUGS PATIENT HAS TO AVOID SEE WEBSITE FOR DETAILS Http://www.sads.org.uk/drugs_to_avoid.htm     Social History:   Social History   Socioeconomic History   Marital status: Married    Spouse name: Not on file   Number of children: Not on file   Years of education: 10   Highest education level: 10th grade  Occupational History   Occupation: Personnel officer and Dealer: [employer]  Tobacco Use   Smoking status: Former    Current packs/day: 0.00    Average packs/day: 4.0 packs/day for 25.0 years (100.0 ttl pk-yrs)    Types: Cigarettes    Start date: 02/06/1963    Quit date: 02/06/1988    Years since quitting: 35.3   Smokeless tobacco: Former  Building services engineer status: Never Used  Substance and Sexual Activity   Alcohol use: Yes     Alcohol/week: 0.0 standard drinks of alcohol    Comment: occassional--special occassions   Drug use: No   Sexual activity: Not on file  Other Topics Concern   Not on file  Social History Narrative   Lives in [place], with wife. No children, 2 sons deceased. No pets.      Pt owns [employer]. Quit tobacco at age 89. He lives with his wife. Pt is not active..the patient father had a history of long QT and syncope and died at age 2. His mother died at age 58 of cancer.    Social Determinants of Health   Financial Resource Strain: Low Risk  (05/28/2023)   Overall Financial Resource Strain (CARDIA)    Difficulty of Paying Living Expenses: Not very hard  Food Insecurity: No Food Insecurity (05/28/2023)   Hunger Vital Sign    Worried  About Running Out of Food in the Last Year: Never true    Ran Out of Food in the Last Year: Never true  Transportation Needs: No Transportation Needs (05/28/2023)   PRAPARE - Administrator, Civil Service (Medical): No    Lack of Transportation (Non-Medical): No  Physical Activity: Inactive (09/23/2017)   Exercise Vital Sign    Days of Exercise per Week: 0 days    Minutes of Exercise per Session: 0 min  Stress: No Stress Concern Present (05/28/2023)   [employer] of Occupational Health - Occupational Stress Questionnaire    Feeling of Stress : Not at all  Social Connections: Socially Integrated (09/23/2017)   Social Connection and Isolation Panel [NHANES]    Frequency of Communication with Friends and Family: More than three times a week    Frequency of Social Gatherings with Friends and Family: Once a week    Attends Religious Services: More than 4 times per year    Active Member of [employer] or Organizations: Yes    Attends Engineer, structural: More than 4 times per year    Marital Status: Married  Catering manager Violence: Not At Risk (05/28/2023)   Humiliation, Afraid, Rape, and Kick questionnaire    Fear of Current or  Ex-Partner: No    Emotionally Abused: No    Physically Abused: No    Sexually Abused: No    Family History:    Family History  Problem Relation Age of Onset   Kidney disease Father        long QT disease   Arrhythmia Father        Prolong QT   Cancer Mother        leukemia   Arrhythmia Brother        Prolong QT   Bone cancer Son        deceased     ROS:  Please see the history of present illness.   All other ROS reviewed and negative.     Physical Exam/Data:   Vitals:   06/13/2023 0230 06/01/2023 0235 06/10/2023 0240 06/06/2023 0248  BP: (!) 166/145     Pulse: 81 69 (!) 57   Resp: (!) 23 (!) 23 (!) 29   SpO2: (!) 76% 96% 93% 95%   No intake or output data in the 24 hours ending 05/30/2023 0347    06/04/2023    3:04 PM 05/28/2023    3:07 PM 05/27/2023    2:04 PM  Last 3 Weights  Weight (lbs) 262 lb 270 lb 9.6 oz 272 lb  Weight (kg) 118.842 kg 122.743 kg 123.378 kg     There is no height or weight on file to calculate BMI.  General: Intubated and not responsive HEENT: normal Neck: no JVD Vascular: No carotid bruits; Distal pulses 2+ bilaterally Cardiac:  normal S1, S2; RRR; no murmur  Lungs:  clear to auscultation bilaterally, no wheezing, rhonchi or rales  Abd: soft, nontender, no hepatomegaly  Ext: no edema Musculoskeletal:  No deformities, nonfocal Skin: warm and dry  Neuro: No not able to evaluate  EKG:  The EKG was personally reviewed and demonstrates: Sinus tachycardia with ventricular paced rhythm Telemetry:  Telemetry was personally reviewed and demonstrates: Ventricular tachycardia.  Relevant CV Studies:   Laboratory Data:  High Sensitivity Troponin:   Recent Labs  Lab 05/30/2023 0215  TROPONINIHS 99*     Chemistry Recent Labs  Lab 06/02/2023 0215 06/13/2023 0313  NA 136 132*  K 4.6  3.0*  CL 98  --   CO2 22  --   GLUCOSE 265*  --   BUN 19  --   CREATININE 1.34*  --   CALCIUM 8.7*  --   MG 2.3  --   GFRNONAA 55*  --   ANIONGAP 16*  --      Recent Labs  Lab 06/17/2023 0215  PROT 6.6  ALBUMIN 2.8*  AST 164*  ALT 142*  ALKPHOS 78  BILITOT 1.7*   Lipids No results for input(s): "CHOL", "TRIG", "HDL", "LABVLDL", "LDLCALC", "CHOLHDL" in the last 168 hours.  Hematology Recent Labs  Lab 06/11/2023 0215 06/13/2023 0313  WBC 22.0*  --   RBC 4.37  --   HGB 11.4* 11.6*  HCT 36.7* 34.0*  MCV 84.0  --   MCH 26.1  --   MCHC 31.1  --   RDW 14.2  --   PLT 282  --    Thyroid No results for input(s): "TSH", "FREET4" in the last 168 hours.  BNPNo results for input(s): "BNP", "PROBNP" in the last 168 hours.  DDimer No results for input(s): "DDIMER" in the last 168 hours.   Radiology/Studies:  CARDIAC CATHETERIZATION  Result Date: 06/17/2023   There is moderate left ventricular systolic dysfunction.   LV end diastolic pressure is mildly elevated.   The left ventricular ejection fraction is 35-45% by visual estimate. 1.  Normal coronary arteries. 2.  Moderately reduced LV systolic function with an EF of 35 to 40%. 3.  Mildly elevated left ventricular end-diastolic pressure at 18 mmHg. Recommendations: No culprit is identified for ventricular fibrillation arrest.  Continue supportive care. Amiodarone drip can be continued for now.  Manage electrolyte abnormalities. Recommend EP consultation during this admission.   DG Chest Portable 1 View  Result Date: 05/22/2023 CLINICAL DATA:  STEMI, status post intubation EXAM: PORTABLE CHEST 1 VIEW COMPARISON:  06/16/2019 FINDINGS: Cardiac shadow is enlarged. Pacing device is again noted and stable. Endotracheal tube is seen 3.8 cm above the carina. Gastric catheter extends into the stomach. Lungs are well aerated bilaterally. Patchy airspace opacity is noted throughout the right lung as well as within the left apex. No bony abnormality is noted. IMPRESSION: Tubes and lines as described. Patchy airspace opacity in the right lung as well as the left apex. Electronically Signed   By: Alcide Clever M.D.    On: 06/09/2023 02:44     Assessment and Plan:   Out-of-hospital cardiac arrest due to ventricular fibrillation: EKG with underlying paced rhythm and thus not able to determine if there are ST changes.  Due to that, I recommended proceeding with emergent cardiac catheterization and possible PCI.  This was done via the right femoral artery given that his radial pulse was very poor bilaterally.  Cardiac catheterization showed near normal coronary arteries with no significant disease.  EF was 35 to 40% with an LVEDP of 18 mmHg.  His ventricular arrhythmia is not due to acute coronary syndrome.  It could be related to prolonged QT syndrome or underlying cardiomyopathy.  Recommend EP consultation.  Continue supportive care for now.  Amiodarone drip can be continued for now. Chronic systolic heart failure: His ejection fraction appears to be stable and his LVEDP was only 18 mmHg.  No evidence of significant decompensation.  I requested follow-up echocardiogram.  Heart failure medications are currently on hold due to hypotension. Paroxysmal atrial fibrillation: He is on Eliquis as an outpatient which will be held post cardiac catheterization.  Will  use heparin drip instead which can be started in 8 hours from now until we ensure no need for invasive procedures.    For questions or updates, please contact [facility] Please consult [email or web address] for contact info under    Signed, Lorine Bears, MD  06/21/2023 3:47 AM

## 2023-06-22 NOTE — Procedures (Signed)
Central Venous Catheter Insertion Procedure Note  Khadin Bresette  [id number]  Mar 09, 1946  Date:06/13/2023  Time:7:27 AM   Provider Performing:Alexxis Mackert A Nayelly Laughman   Procedure: Insertion of Non-tunneled Central Venous Catheter(36556) with US guidance (40981)   Indication(s) Medication administration and Difficult access  Consent Unable to obtain consent due to emergent nature of procedure.  Anesthesia Topical only with 1% lidocaine   Timeout Verified patient identification, verified procedure, site/side was marked, verified correct patient position, special equipment/implants available, medications/allergies/relevant history reviewed, required imaging and test results available.  Sterile Technique Maximal sterile technique including full sterile barrier drape, hand hygiene, sterile gown, sterile gloves, mask, hair covering, sterile ultrasound probe cover (if used).  Procedure Description Area of catheter insertion was cleaned with chlorhexidine and draped in sterile fashion.  With real-time ultrasound guidance a central venous catheter was placed into the right internal jugular vein. Nonpulsatile blood flow and easy flushing noted in all ports.  The catheter was sutured in place and sterile dressing applied.  Complications/Tolerance None; patient tolerated the procedure well. Chest X-ray is ordered to verify placement for internal jugular or subclavian cannulation.   Chest x-ray is not ordered for femoral cannulation.  EBL Minimal  Specimen(s) None  Webb Silversmith, DNP, CCRN, FNP-C, AGACNP-BC Acute Care & Family Nurse Practitioner  [facility]  See Amion for personal pager PCCM on call pager [phone] until 7 am

## 2023-06-22 NOTE — Progress Notes (Signed)
   05/22/2023 0200  Spiritual Encounters  Type of Visit Initial  Care provided to: Family;Significant other  Referral source Code page  Reason for visit Code  OnCall Visit Yes  Spiritual Framework  Presenting Themes Meaning/purpose/sources of inspiration;Community and relationships  Community/Connection Family;Friend(s);Faith community;Significant other  Strengths Faith  Patient Stress Factors Not reviewed  Family Stress Factors Major life changes;Lack of knowledge;Exhausted  Interventions  Spiritual Care Interventions Made Established relationship of care and support;Compassionate presence;Reflective listening;Encouragement  Intervention Outcomes  Outcomes Awareness of support;Reduced anxiety;Reduced fear  Spiritual Care Plan  Spiritual Care Issues Still Outstanding Chaplain will continue to follow   Spoke to patient wife and keep her company while waiting to hear back from medical team. Patient was receiving medical care from medical team during my visit with his wife. Escorted the family to the new CT waiting while that waited to hear more on the patient. Advise family that I will be checking on them and the medical team will be coming to get you once the procedure is done.

## 2023-06-22 NOTE — Death Summary Note (Signed)
DEATH SUMMARY   Patient Details  Name: William Woods. MRN: [id number] DOB: [birth year]  Admission/Discharge Information   Admit Date:  06-16-2023  Date of Death:  Jun 16, 2023   Time of Death:  927AM  Length of Stay: 0  Referring Physician: Pcp, No   Reason(s) for Hospitalization  V FIB ARREST, ISCHEMIC CARDIOMYOPATHY, PE  Diagnoses  Preliminary cause of death: V FIB ARREST, ISCHEMIC CARDIOMYOPATHY, PE Secondary Diagnoses (including complications and co-morbidities):  Principal Problem:   Cardiac arrest [facility])   Brief Hospital Course (including significant findings, care, treatment, and services provided and events leading to death)   HPI  77 y.o with significant PMH of complete heart block s/p dual-chamber pacemaker placement, chronic systolic heart failure due to nonischemic cardiomyopathy s/p cath (2013), OSA on CPAP, PAF on Eliquis, HTN, HLD, thyroid disease and long QT syndrome. who presented to the ED initially as a code STEMI postcardiac arrest.   Per ED reports and patient's wife, patient was sitting in a recliner with CPAP on when he complained of difficulty breathing and went unresponsive.  Patient's wife initiated CPR and called EMS.  On EMS arrival patient was found to be in pulseless V-fib/V. tach arrest.  Patient was defibrillated 3 times and given epinephrine x 1 and amiodarone bolus with ROSC after the third defibrillation.  He was intubated in the field and transported to the ED    ED Course: On ED arrival, he was intubated with ETT 8.0 BVM in progress.  He was noted with spontaneous respiration and was hemodynamically stable with paced rhythm but with sats in the low 80s.   ETT was switched with improvement of his oxygenation unfortunately shortly after receiving intubation medications he lost pulses and CPR started  @0221 .  Patient received 200 J at 0222 with ROSC achieved at 0223.  He was noted to be in V. tach for which he received 100 mg of lidocaine x 1 and  synchronized cardioversion x 2 with return of an organized rhythm after the second cardioversion.  EKG was obtained which did not show convincing evidence of STEMI however given his recurrent V-fib and V. tach patient was taken emergently to cardiac cath.   Past Medical History  complete heart block s/p dual-chamber pacemaker placement, chronic systolic heart failure due to nonischemic cardiomyopathy s/p cath (2013), OSA on CPAP, PAF on Eliquis, HTN, HLD, thyroid disease and long QT syndrome     Consults:  Cardiology   Procedures:  June 16, 2023: Intubation   Significant Diagnostic Tests:  06-16-2023: Chest Xray> 06-16-23: Noncontrast CT head> Jun 16, 2023: CTA Chest, abdomen and pelvis>   Interim History / Subjective:        Micro Data:  June 16, 2023: SARS-CoV-2 PCR> negative 16-Jun-2023: Influenza PCR> negative 2023-06-16: Blood culture x2> 06/16/23: Urine Culture> 2023/06/16: MRSA PCR>>      GOALS OF CARE DISCUSSION   The Clinical status was relayed to family in detail- Wife and Brother   Updated and notified of patients medical condition- Patient remains unresponsive and will not open eyes to command.     Explained to family course of therapy and the modalities    Patient with Progressive multiorgan failure with a very high probablity of a very minimal chance of meaningful recovery despite all aggressive and optimal medical therapy.      Family understands the situation. Massive heart attack with severe brain damage Multiple organs falling with broken spine and ribs     They have consented and agreed to DNR. Will proceed to comfort  care measures Family are satisfied with Plan of action and management. All questions answered     Pertinent Labs and Studies  Significant Diagnostic Studies CT Angio Chest/Abd/Pel for Dissection W and/or W/WO  Result Date: 05/26/2023 CLINICAL DATA:  Code STEMI patient, found at home by EMS, epinephrine and amiodarone given in the field. Patient intubated in the field. EXAM: CT  ANGIOGRAPHY CHEST, ABDOMEN AND PELVIS TECHNIQUE: Noncontrast axial chest CT was initially performed. Multidetector CT imaging through the chest, abdomen and pelvis was performed using the standard protocol during bolus administration of intravenous contrast. Multiplanar reconstructed images and MIPs were obtained and reviewed to evaluate the vascular anatomy. RADIATION DOSE REDUCTION: This exam was performed according to the departmental dose-optimization program which includes automated exposure control, adjustment of the mA and/or kV according to patient size and/or use of iterative reconstruction technique. CONTRAST:  OMNIPAQUE IOHEXOL 350 MG/ML SOLN COMPARISON:  Portable chest earlier today, PA Lat chest 06/16/2019. No prior body CT apart from a lumbar spine CT from 04/25/2023 FINDINGS: CTA CHEST FINDINGS Cardiovascular: There is a left chest dual lead pacing system with an AID wire. The wiring appears appropriately positioned. There is mild cardiomegaly, with a left chamber predominance and left ventricular and septal wall hypertrophy. There are minimal calcifications in the left main and LAD coronary arteries, no other visible plaques. There is no substantial pericardial effusion. The pulmonary trunk is prominent at 3.4 cm indicating arterial hypertension. There is a questionable small arterial filling defect in the left lower lobe in a posterior basal segmental artery (series 6 axial 75-83), alternatively could be due to streak and beam hardening artifact mimicking a filling defect. If this is actually a thrombus, no other embolus is seen. Please note however, contrast bolus timing was not optimized for pulmonary arterial visualization and the majority of the segmental and subsegmental arterial bed is unopacified and not evaluated. The pulmonary veins are nondistended. There is mild thoracic aortic tortuosity, mild scattered calcific plaques in the aorta and great vessels. There is no great vessel  stenosis. The aortic root measures 4.3 cm at the sinuses of Valsalva. The ascending segment measures borderline prominent at 3.9 cm. Rest of the thoracic aorta measures under 3 cm. There is no stenosis, perforated ulcer, wall hematoma or aortic dissection. Mediastinum/Nodes: No axillary or mediastinal adenopathy is seen or thyroid nodules. There is mediastinal lipomatosis and epipericardial fat pads on both sides. ETT is in place with tip 2.5 cm from the carina. NGT passes well into the stomach abutting the far wall of the distal body of stomach. Central airways are patent. No esophageal thickening is seen. Lungs/Pleura: There are small bilateral layering pleural effusions, greater on the right. No pneumothorax. There are interstitial and patchy ground-glass opacities of both upper lobes with the posterior gradient, findings consistent with interstitial and alveolar edema. There are mild centrilobular and paraseptal emphysematous changes. Consolidation or atelectasis noted in the posterior lower lobes adjacent the effusions. There are faint ground-glass opacities and slight interstitial thickening in the right middle lobe. No pneumothorax is seen. There is a 7 mm noncalcified nodule anteriorly in the left upper lobe on 7:43. There is a nearby 5 mm nodule in the left upper lobe anteriorly on 7:46. Follow-up imaging will be needed. Musculoskeletal: There are multilevel acute anterior to anterolateral displaced rib cage fractures almost certainly related to recent CPR. On the right, there is a slightly displaced anterolateral third rib, mildly displaced lateral fourth rib, and displaced fracture with overriding of the  anterior fourth and the anterolateral fifth through seventh ribs, with mild underlying pleural reaction. On the left, there are slightly displaced fractures of the anterior and anterolateral aspects of the third rib, displaced anterior fractures with overriding of the left fourth through sixth ribs, and a  slightly displaced fracture of the anterolateral seventh rib. There is no displaced sternal fracture no other visible rib fractures. There is an anterior inferior transverse corner fracture of the T7 vertebral body eccentric to the left, with disruption of the T7-8 disc space and widening of the disc space. There is disruption of enthesopathic bridging bone to the right anteriorly at this level. No listhesis or acute compression fracture is seen with mild chronic wedging of the T8 vertebral body which was seen on the prior PA and lateral chest. All other multilevel thoracic spine bridging enthesopathy is intact. The pedicles and posterior elements show no displaced fractures. No spinal canal hematoma is evident. Review of the MIP images confirms the above findings. CTA ABDOMEN AND PELVIS FINDINGS VASCULAR Aorta: Normal caliber with mild patchy calcific plaques. No dissection, stenosis or penetrating ulcer. Celiac: Patent without evidence of aneurysm, dissection, vasculitis or significant stenosis. SMA: There are minimal mixed plaques proximal to the inflection point of the vessel, but no flow-limiting stenosis, dissection or aneurysm, or branch occlusion. Renals: 2 arteries arising in close tandem supply each kidney. On both sides the dominant artery arises first. Trace calcifications appear to be present in some of the hilar branches but there is no flow-limiting stenosis in the larger arteries. No branch occlusion is seen. IMA: Normal.  No branch occlusion. Inflow: There are mild patchy calcific plaques in the common iliac and internal iliac arteries. No significant plaques in the external iliac arteries. No flow-limiting inflow stenosis. Proximal outflow arteries are clear. Veins: Unopacified and not evaluated. Review of the MIP images confirms the above findings. NON-VASCULAR Hepatobiliary: Limited detail due to metal artifact from overlying wires. No obvious mass. Gallbladder and bile ducts are unremarkable.  Pancreas: No abnormality. Spleen: No abnormality. Adrenals/Urinary Tract: There is no adrenal mass on left. On the right, there is a 1.9 cm low-density adrenal lesion of the lateral limb measuring 17 Hounsfield units. Cortical thinning noted of both kidneys. In the left lower pole there is a homogeneous low-density lesion measuring 3.3 x 2.8 cm, with Hounsfield density of 52 above the density of fluid. This could be a proteinaceous or hemorrhagic cyst but given density warrants follow-up with MRI. No other focal renal abnormality is seen. There are no urinary stone or obstruction. There is mild bladder thickening versus underdistention. Stomach/Bowel: No dilatation or wall thickening. Uncomplicated sigmoid diverticulosis. An appendix is not seen in this patient. Lymphatic: No adenopathy is seen. Reproductive: No prostatomegaly. Other: Small umbilical fat hernia. Moderate-sized right and small left inguinal fat hernias. There appears to be a pressure dressing over the right groin. There is moderate underlying patchy subcutaneous density which is probably subcutaneous hemorrhage but is nonlocalizing. Musculoskeletal: No regional skeletal fracture is seen. Osteopenia and bridging syndesmophytes of the lumbar region are noted present. Bridging osteophytes both SI joints anteriorly. Review of the MIP images confirms the above findings. IMPRESSION: 1. Acute anterior inferior transverse corner fracture of the T7 vertebral body with disruption of the T7-8 disc space and widening of the disc space. There is also disruption of enthesopathic bridging bone to the right anteriorly at this level. No listhesis or acute compression fracture is seen. If the patient's pacing system is MRI compatible, MRI  would be helpful to exclude a spinal canal hematoma. No obvious canal hematoma is seen with limited CT evaluation. Posterior elements are grossly intact as well as seen. 2. Multilevel bilateral acute displaced anterior to  anterolateral rib cage fractures almost certainly related to recent CPR. 3. Small bilateral pleural effusions with adjacent atelectasis or consolidation in the posterior lower lobes. 4. Interstitial and alveolar edema in the upper lobes. Underlying pneumonia possible. 5. Multiple pulmonary nodules. Most significant: Left solid pulmonary nodule within the upper lobe measuring 7 mm. Per Fleischner Society Guidelines, recommend a non-contrast Chest CT at 3-6 months, then another non-contrast Chest CT at 18-24 months. These guidelines do not apply to immunocompromised patients and patients with cancer. Follow up in patients with significant comorbidities as clinically warranted. For lung cancer screening, adhere to Lung-RADS guidelines. Reference: Radiology. 2017; 284(1):228-43. 6. Cardiomegaly with left chamber predominance, left ventricular and septal wall hypertrophy. 7. Aortic and coronary artery atherosclerosis. 8. Prominent pulmonary trunk indicating arterial hypertension. 9. Questionable small arterial filling defect in a posterior basal segmental artery of the left lower lobe, versus streak and beam hardening artifact. If this is actually a thrombus, no other embolus is seen. Please note however, contrast bolus timing was not optimized for pulmonary arterial visualization and the majority of the segmental and subsegmental arterial beds are unopacified. 10. 1.9 cm low-density right adrenal lesion. Statistically this is most likely a benign adenoma. Recommend follow-up adrenal washout CT in 1 year. If stable for ? 1 year, no further follow-up imaging. JACR 2017 Aug; 14(8):1038-44, JCAT 2016 Mar-Apr; 40(2):194-200, Urol J 2006 Spring; 3(2):71-4. 11. Emphysema. 12. 3.3 x 2.8 cm left renal lesion indeterminate by density numbers. Recommend ultrasound. 13. Patchy subcutaneous intermediate density material right groin area, probably subcutaneous hemorrhagic products. No formed hematoma. 14. Umbilical and inguinal fat  hernias. 15. Critical Value/emergent results were called by telephone at the time of interpretation on 05/25/2023 at 6:03 am to provider Webb Silversmith , who verbally acknowledged these results. Emphysema (ICD10-J43.9). Electronically Signed   By: Almira Bar M.D.   On: 06/19/2023 06:11   CT HEAD WO CONTRAST ( )  Result Date: 05/26/2023 CLINICAL DATA:  Anoxic brain injury. ST elevation myocardial infarction. EXAM: CT HEAD WITHOUT CONTRAST TECHNIQUE: Contiguous axial images were obtained from the base of the skull through the vertex without intravenous contrast. RADIATION DOSE REDUCTION: This exam was performed according to the departmental dose-optimization program which includes automated exposure control, adjustment of the mA and/or kV according to patient size and/or use of iterative reconstruction technique. COMPARISON:  03/20/2012 FINDINGS: Brain: High-density dural surface presumably in the setting of recent coronary angiogram. There is mild low-density in the cerebral white matter attributed to chronic small vessel ischemia. No indication of global anoxic injury or focal infarct. No hemorrhage, hydrocephalus, or collection. Vascular: No focal hyperdensity. Skull: Normal. Negative for fracture or focal lesion. Sinuses/Orbits: Nasopharyngeal fluid in the setting of intubation. Symmetric proptosis from increase in retro-orbital fat. IMPRESSION: No acute intracranial finding. No indication of global anoxic injury. Electronically Signed   By: Tiburcio Pea M.D.   On: 06/19/2023 04:34   CARDIAC CATHETERIZATION  Result Date: 06/01/2023   There is moderate left ventricular systolic dysfunction.   LV end diastolic pressure is mildly elevated.   The left ventricular ejection fraction is 35-45% by visual estimate. 1.  Normal coronary arteries. 2.  Moderately reduced LV systolic function with an EF of 35 to 40%. 3.  Mildly elevated left ventricular end-diastolic pressure at 18 mmHg.  Recommendations: No  culprit is identified for ventricular fibrillation arrest.  Continue supportive care. Amiodarone drip can be continued for now.  Manage electrolyte abnormalities. Recommend EP consultation during this admission.   DG Chest Portable 1 View  Result Date: 06/09/2023 CLINICAL DATA:  STEMI, status post intubation EXAM: PORTABLE CHEST 1 VIEW COMPARISON:  06/16/2019 FINDINGS: Cardiac shadow is enlarged. Pacing device is again noted and stable. Endotracheal tube is seen 3.8 cm above the carina. Gastric catheter extends into the stomach. Lungs are well aerated bilaterally. Patchy airspace opacity is noted throughout the right lung as well as within the left apex. No bony abnormality is noted. IMPRESSION: Tubes and lines as described. Patchy airspace opacity in the right lung as well as the left apex. Electronically Signed   By: Alcide Clever M.D.   On: 06/05/2023 02:44   CUP PACEART REMOTE DEVICE CHECK  Result Date: 06/06/2023 Scheduled remote reviewed. Normal device function.  80 AF episodes, longest duration 5hrs , overall controlled rates, burden 33.7% Known PAF, Eliquis and metoprolol, AF Ablation scheduled for 07/08/23 per Epic. HF diagnostics currenlty abnormal Follow up as scheduled. Next remote 91 days. LA, CVRS  CUP PACEART INCLINIC DEVICE CHECK  Result Date: 05/27/2023 CRT-P device check in clinic. Normal device function. Thresholds, sensing, impedance consistent with previous measurements. Histograms appropriate for patient and level of activity. Freq mode switches d/t high AFib burden. No ventricular high rate episodes. Patient bi-ventricularly pacing 95% of the time. Device programmed with appropriate safety margins. Device heart failure diagnostics are within normal limits and stable over time. Estimated longevity __4 years_. Patient enrolled in remote follow-up. Plan to check device remotely in 3 months and every 6 months in office.   Microbiology Recent Results (from the past 240 hour(s))   Culture, blood (Routine X 2) w Reflex to ID Panel     Status: None (Preliminary result)   Collection Time: 06/11/2023  5:45 AM   Specimen: BLOOD  Result Value Ref Range Status   Specimen Description BLOOD BLOOD RIGHT HAND  Final   Special Requests   Final    BOTTLES DRAWN AEROBIC AND ANAEROBIC Blood Culture adequate volume   Culture   Final    NO GROWTH <12 HOURS Performed at [facility], [address] [place], [place] [address]    Report Status PENDING  Incomplete  Culture, blood (Routine X 2) w Reflex to ID Panel     Status: None (Preliminary result)   Collection Time: 06/10/2023  5:52 AM   Specimen: BLOOD  Result Value Ref Range Status   Specimen Description BLOOD BLOOD LEFT HAND  Final   Special Requests   Final    BOTTLES DRAWN AEROBIC AND ANAEROBIC Blood Culture results may not be optimal due to an inadequate volume of blood received in culture bottles   Culture   Final    NO GROWTH <12 HOURS Performed at [facility], [address], [place], [place] [address]    Report Status PENDING  Incomplete    Lab Basic Metabolic Panel: Recent Labs  Lab 06/13/2023 0215 05/29/2023 0313 05/31/2023 0458  NA 136 132*  --   K 4.6 3.0*  --   CL 98  --   --   CO2 22  --   --   GLUCOSE 265*  --   --   BUN 19  --   --   CREATININE 1.34*  --   --   CALCIUM 8.7*  --   --  MG 2.3  --  2.3  PHOS  --   --  6.3*   Liver Function Tests: Recent Labs  Lab 06/20/2023 0215  AST 164*  ALT 142*  ALKPHOS 78  BILITOT 1.7*  PROT 6.6  ALBUMIN 2.8*   No results for input(s): "LIPASE", "AMYLASE" in the last 168 hours. No results for input(s): "AMMONIA" in the last 168 hours. CBC: Recent Labs  Lab 06/03/2023 0215 06/21/2023 0313 06/07/2023 0458  WBC 22.0*  --  26.7*  NEUTROABS 13.4*  --   --   HGB 11.4* 11.6* 11.1*  HCT 36.7* 34.0* 35.6*  MCV 84.0  --  83.6  PLT 282  --  309   Cardiac Enzymes: No results for input(s): "CKTOTAL", "CKMB", "CKMBINDEX", "TROPONINI" in the  last 168 hours. Sepsis Labs: Recent Labs  Lab 05/27/2023 0215 06/19/2023 0458  WBC 22.0* 26.7*     [place] Blizzard 06/20/2023, 9:28 AM

## 2023-06-22 NOTE — H&P (Signed)
NAME:  William Woods., MRN:  [id number], DOB:  1946-04-06, LOS: 0 ADMISSION DATE:  06/06/2023, CONSULTATION DATE: 06/19/2023 REFERRING MD:  Sheldon Silvan CHIEF COMPLAINT: Cardiac arrest  HPI  77 y.o with significant PMH of complete heart block s/p dual-chamber pacemaker placement, chronic systolic heart failure due to nonischemic cardiomyopathy s/p cath (2013), OSA on CPAP, PAF on Eliquis, HTN, HLD, thyroid disease and long QT syndrome. who presented to the ED initially as a code STEMI postcardiac arrest.  Per ED reports and patient's wife, patient was sitting in a recliner with CPAP on when he complained of difficulty breathing and went unresponsive.  Patient's wife initiated CPR and called EMS.  On EMS arrival patient was found to be in pulseless V-fib/V. tach arrest.  Patient was defibrillated 3 times and given epinephrine x 1 and amiodarone bolus with ROSC after the third defibrillation.  He was intubated in the field and transported to the ED    ED Course: On ED arrival, he was intubated with ETT 8.0 BVM in progress.  He was noted with spontaneous respiration and was hemodynamically stable with paced rhythm but with sats in the low 80s.   ETT was switched with improvement of his oxygenation unfortunately shortly after receiving intubation medications he lost pulses and CPR started  @0221 .  Patient received 200 J at 0222 with ROSC achieved at 0223.  He was noted to be in V. tach for which he received 100 mg of lidocaine x 1 and synchronized cardioversion x 2 with return of an organized rhythm after the second cardioversion.  EKG was obtained which did not show convincing evidence of STEMI however given his recurrent V-fib and V. tach patient was taken emergently to cardiac cath.   Vital signs post arrest showed HR of 154 beats/minute, BP 128/66mm Hg, the RR 14 breaths/minute, and the oxygen saturation 83% on vent and a temperature of 98.54F (36.7C).  Pertinent Labs/Diagnostics Findings: Na+/  K+: 136/4.6 Glucose: 265 BUN/Cr.:19/1.34 Calcium:8.7  AST/ALT:164/142 WBC: 22.0  PCT: negative <0.10  Lactic acid:  COVID PCR: Negative,  troponin: 99  BNP:108.9   ABG: pO2 77; pCO2 52.7; pH 7.23;  HCO3 22.3, %O2 Sat 92.  CXR> Patchy airspace opacity in the right lung as well as the left apex.   Patient admitted to ICU post cath. PCCM consulted.  Past Medical History  complete heart block s/p dual-chamber pacemaker placement, chronic systolic heart failure due to nonischemic cardiomyopathy s/p cath (2013), OSA on CPAP, PAF on Eliquis, HTN, HLD, thyroid disease and long QT syndrome  Significant Hospital Events   8/24:  Consults:  Cardiology  Procedures:  8/24: Intubation  Significant Diagnostic Tests:  8/24: Chest Xray> 8/24: Noncontrast CT head> 8/24: CTA Chest, abdomen and pelvis>  Interim History / Subjective:      Micro Data:  8/24: SARS-CoV-2 PCR> negative 8/24: Influenza PCR> negative 8/24: Blood culture x2> 8/24: Urine Culture> 8/24: MRSA PCR>>   Antimicrobials:  Unasyn 8/24>  OBJECTIVE  Blood pressure (!) 166/145, pulse (!) 57, resp. rate (!) 29, SpO2 95%.       No intake or output data in the 24 hours ending 06/19/2023 0413 There were no vitals filed for this visit.  Physical Examination  GENERAL: 77 year-old critically ill patient lying in the bed intubated NOT requiring sedation EYES: PEERLA. No scleral icterus. Extraocular muscles intact.  HEENT: Head atraumatic, normocephalic. Oropharynx and nasopharynx clear.  NECK:  No JVD, supple  LUNGS: Decreased breath sounds bilaterally.  No  use of accessory muscles of respiration.  CARDIOVASCULAR: S1, S2 normal. No murmurs, rubs, or gallops.  ABDOMEN: Soft, NTND EXTREMITIES: No swelling or erythema.  Capillary refill < 3 seconds in all extremities. Pulses palpable distally. NEUROLOGIC: The patient is intubated and NOT sedated.  Does not respond to deep sternal rub.  Does not follow commands. Does not respond  confrontation bilaterally, pupils right 3 mm, left 3 mm,and minimally responsive. Corneal reflex absent bilateral. Gag reflex absent, Extremities flaccid throughout.  No spontaneous movement noted. No purposeful movements noted.Does not respond to noxious stimuli in any extremity. SKIN: No obvious rash, lesion, or ulcer. Warm to touch Labs/imaging that I havepersonally reviewed  (right click and "Reselect all SmartList Selections" daily)     Labs   CBC: Recent Labs  Lab 06/02/2023 0215 05/26/2023 0313  WBC 22.0*  --   NEUTROABS 13.4*  --   HGB 11.4* 11.6*  HCT 36.7* 34.0*  MCV 84.0  --   PLT 282  --     Basic Metabolic Panel: Recent Labs  Lab 06/09/2023 0215 06/16/2023 0313  NA 136 132*  K 4.6 3.0*  CL 98  --   CO2 22  --   GLUCOSE 265*  --   BUN 19  --   CREATININE 1.34*  --   CALCIUM 8.7*  --   MG 2.3  --    GFR: Estimated Creatinine Clearance: 63.4 mL/min (A) (by C-G formula based on SCr of 1.34 mg/dL (H)). Recent Labs  Lab 05/23/2023 0215  WBC 22.0*    Liver Function Tests: Recent Labs  Lab 06/16/2023 0215  AST 164*  ALT 142*  ALKPHOS 78  BILITOT 1.7*  PROT 6.6  ALBUMIN 2.8*   No results for input(s): "LIPASE", "AMYLASE" in the last 168 hours. No results for input(s): "AMMONIA" in the last 168 hours.  ABG    Component Value Date/Time   PHART 7.234 (L) 05/23/2023 0313   PCO2ART 52.7 (H) 05/30/2023 0313   PO2ART 77 (L) 06/19/2023 0313   HCO3 22.3 05/22/2023 0313   TCO2 24 05/25/2023 0313   ACIDBASEDEF 5.0 (H) 06/07/2023 0313   O2SAT 92 06/19/2023 0313     Coagulation Profile: Recent Labs  Lab 05/24/2023 0215  INR 1.5*    Cardiac Enzymes: No results for input(s): "CKTOTAL", "CKMB", "CKMBINDEX", "TROPONINI" in the last 168 hours.  HbA1C: Hemoglobin A1C  Date/Time Value Ref Range Status  11/11/2016 02:04 PM 5.6%  Final  01/20/2012 03:11 AM 6.1 4.2 - 6.3 % Final    Comment:    The American Diabetes Association recommends that a primary goal  of therapy should be <7% and that physicians should reevaluate the treatment regimen in patients with HbA1c values consistently >8%.    Hgb A1c MFr Bld  Date/Time Value Ref Range Status  09/23/2017 09:35 AM 5.4 <5.7 % of total Hgb Final    Comment:    For the purpose of screening for the presence of diabetes: . <5.7%       Consistent with the absence of diabetes 5.7-6.4%    Consistent with increased risk for diabetes             (prediabetes) > or =6.5%  Consistent with diabetes . This assay result is consistent with a decreased risk of diabetes. . Currently, no consensus exists regarding use of hemoglobin A1c for diagnosis of diabetes in children. . According to American Diabetes Association (ADA) guidelines, hemoglobin A1c <7.0% represents optimal control in non-pregnant diabetic patients. Different  metrics may apply to specific patient populations.  Standards of Medical Care in Diabetes(ADA). Marland Kitchen   04/30/2016 08:46 AM 5.7 (H) <5.7 % Final    Comment:      For someone without known diabetes, a hemoglobin A1c value between 5.7% and 6.4% is consistent with prediabetes and should be confirmed with a follow-up test.   For someone with known diabetes, a value <7% indicates that their diabetes is well controlled. A1c targets should be individualized based on duration of diabetes, age, co-morbid conditions and other considerations.   This assay result is consistent with an increased risk of diabetes.   Currently, no consensus exists regarding use of hemoglobin A1c for diagnosis of diabetes in children.       CBG: No results for input(s): "GLUCAP" in the last 168 hours.  Review of Systems:   Unable to be obtained secondary to the patient's intubated and sedated status.   Past Medical History  He,  has a past medical history of CHB (complete heart block) (HCC), Edema, Hypertension, Long Q-T syndrome, Obesity (BMI 30-39.9), Obstructive sleep apnea, Pacemaker, Syncope,  and Thyroid disease.   Surgical History    Past Surgical History:  Procedure Laterality Date   BIV UPGRADE N/A 06/16/2019   Procedure: BIVP UPGRADE - BIVPacemake Upgrade;  Surgeon: Duke Salvia, MD;  Location: [facility] INVASIVE CV LAB;  Service: Cardiovascular;  Laterality: N/A;   CARDIAC CATHETERIZATION     [facility]    CARDIAC CATHETERIZATION  01/2012   [facility]: no significant CAD, EF 40%.    LOOP RECORDER EXPLANT N/A 02/04/2012   Procedure: LOOP RECORDER EXPLANT;  Surgeon: Hillis Range, MD;  Location: [facility] CATH LAB;  Service: Cardiovascular;  Laterality: N/A;   LOOP RECORDER IMPLANT N/A 01/21/2012   Procedure: LOOP RECORDER IMPLANT;  Surgeon: Duke Salvia, MD;  Location: [facility] CATH LAB;  Service: Cardiovascular;  Laterality: N/A;   OTHER SURGICAL HISTORY  02/04/2012    removal of a previously implanted MDT Reveal XT implantable loop recorder   PACEMAKER INSERTION  02/04/2012   Medtronic Adapta L model ADDRL 1 (serial number [id number]) pacemaker.   PERMANENT PACEMAKER INSERTION N/A 02/04/2012   Procedure: PERMANENT PACEMAKER INSERTION;  Surgeon: Hillis Range, MD;  Location: [facility] CATH LAB;  Service: Cardiovascular;  Laterality: N/A;     Social History   reports that he quit smoking about 35 years ago. His smoking use included cigarettes. He started smoking about 60 years ago. He has a 100 pack-year smoking history. He has quit using smokeless tobacco. He reports current alcohol use. He reports that he does not use drugs.   Family History   His family history includes Arrhythmia in his brother and father; Bone cancer in his son; Cancer in his mother; Kidney disease in his father.   Allergies Allergies  Allergen Reactions   Other     LONG QT SYNDROME--MANY DRUGS PATIENT HAS TO AVOID SEE WEBSITE FOR DETAILS Http://www.sads.org.uk/drugs_to_avoid.htm      Home Medications  Prior to Admission medications   Medication Sig Start Date End Date Taking? Authorizing Provider  apixaban (ELIQUIS) 5 MG TABS  tablet Take 1 tablet (5 mg total) by mouth 2 (two) times daily. 11/25/22   Duke Salvia, MD  Ascorbic Acid (VITAMIN C) 1000 MG tablet Take 1,000 mg by mouth 2 (two) times daily.    [provider]  Coenzyme Q10 (CO Q-10 PO) Take by mouth.    [provider]  furosemide (LASIX) 80 MG tablet Take 1  tablet (80 mg total) by mouth daily. 06/10/23 09/08/23  Duke Salvia, MD  losartan (COZAAR) 25 MG tablet Take 1 tablet (25 mg total) by mouth daily. 01/30/23   Duke Salvia, MD  MAGNESIUM PO Take by mouth. Patient not taking: Reported on 06/04/2023    [provider]  meloxicam (MOBIC) 15 MG tablet Take 15 mg by mouth daily. 04/11/21   [provider]  metoprolol succinate (TOPROL-XL) 25 MG 24 hr tablet Take 1 tablet (25 mg total) by mouth daily. 02/03/23   Duke Salvia, MD  OVER THE COUNTER MEDICATION Cherry extract daily    [provider]  spironolactone (ALDACTONE) 25 MG tablet Take 0.5 tablets (12.5 mg total) by mouth daily. 01/30/23   Duke Salvia, MD  Scheduled Meds:  heparin  4,000 Units Intravenous Once   sodium chloride flush  3 mL Intravenous Q12H   Continuous Infusions:  sodium chloride     amiodarone 60 mg/hr (06/06/2023 0238)   Followed by   amiodarone     heparin     norepinephrine Stopped (05/24/2023 0241)   potassium chloride     PRN Meds:.sodium chloride, acetaminophen, docusate sodium, norepinephrine, polyethylene glycol, sodium chloride flush   Active Hospital Problem list   See systems below  Assessment & Plan:  #Out-of-Hospital Ventricular Fibrillation Cardiac Arrest  #Cardiogenic Shock  PMHx:CAD, prolonged QT syndrome, nonischemic cardiomyopathy,CHB s/p pacemaker,PAF was recently being evaluated for A-fib ablation with possible Watchman device placement S/p cath due to recurrent Vfib/Vtach per STEMI oncall MD, see separate note -Normothermia protocol -Vasopressors as needed to maintain MAP goal  -trend HS Troponin  until peaked  -Heparin gtt per ACS protocol -Obtain CTA chest Abd/pelvis -Atorvastatin 80mg  PO daily -Cardiology following  -Obtain 2D Echo  #Acute Hypoxic Hypercapnic respiratory Failure #Aspiration Pneumonia/Event Hx of OSA on CPAP -Full vent support -VAP prevention protocol -PAD protocol for sedation -daily SAT & SBT -Follow chest x-ray, ABG prn.  -prn fentanyl, propofol  for RASS -1   #Leukocytosis, suspect reactive -Monitor fever curve -Trend WBC's & Procalcitonin -Follow cultures as above -Continue empiric pneumonia coverage pending cultures & sensitivities  Chronic HFrEF L(VEF of 40-45% on recent echo 03/03/23)  -.[facility]  #Sedation needs in setting of mechanical ventilation #Acute encephalopathy in the setting of suspected Anoxic Injury and  Respiratory insufficiency as above -Obtain STAT CTH -EEG -Seizure precaution -Maintain a RASS goal of 0 to -1 -Propofol and Fentanyl to maintain RASS goal -Avoid sedating medications as able -Daily wake up assessment     # AKI likely ATN in the setting of above # Anion gap metabolic acidosis with Lactic Acidosis -Trend Lactate - Monitor I&O's / urinary output - Follow BMP - Ensure adequate renal perfusion - Avoid nephrotoxic agents as able - Replace electrolytes as indicated   #Transaminitis ?early ischemic.  -Likely Shock Liver -trend    #Hyperglycemia -CBG's q4; Target range of 140 to 180 -SSI -Follow ICU Hypo/Hyperglycemia protocol   Best practice:  Diet:  NPO Pain/Anxiety/Delirium protocol (if indicated): Yes (RASS goal -1) VAP protocol (if indicated): Yes DVT prophylaxis: Systemic AC GI prophylaxis: PPI Glucose control:  SSI Yes Central venous access:  N/A Arterial line:  N/A Foley:  Yes, and it is still needed Mobility:  bed rest  PT consulted: N/A Last date of multidisciplinary goals of care discussion [06/07/2023] Code Status:  full code Disposition: ICU   = Goals of Care = Code Status Order:  FULL  Primary Emergency Contact: Sookdeo,Mary Wishes to  pursue full aggressive treatment and intervention options, including CPR and intubation, but goals of care will be addressed on going with family if that should become necessary.   Critical care time: 45 minutes        Webb Silversmith DNP, CCRN, FNP-C, AGACNP-BC Acute Care & Family Nurse Practitioner  Pulmonary & Critical Care Medicine PCCM on call pager [phone]

## 2023-06-22 NOTE — ED Notes (Signed)
Cards in the lobby speaking to the wife and family.

## 2023-06-22 NOTE — Progress Notes (Signed)
eLink Physician-Brief Progress Note Patient Name: William Woods. DOB: 09/15/1946 MRN: [id number]   Date of Service  05/30/2023  HPI/Events of Note  Patient admitted with out of hospital cardiac arrest, recurrent V-Tach / V-Fib, coma, and acute respiratory failure requiring intubation / mechanical ventilation, concern is for evolving STEMI and cardiology is following patient.  eICU Interventions  New Patient Evaluation.        Migdalia Dk 06/12/2023, 4:36 AM

## 2023-06-22 NOTE — Progress Notes (Signed)
Family at bedside. Wife states that once some more family visits she wants to move forward with comfort measures. Dr. Belia Heman made aware. Patient is DNR and no escalation in care.

## 2023-06-22 NOTE — ED Notes (Signed)
@   0216: ET 7.6 26 @ the lip. Fentanyl @ 0215. Etomidate 20mg  @ 0215. Rocuronium 100mg  @0215 .

## 2023-06-22 NOTE — Progress Notes (Signed)
Pt extubated @ 0918 to comfort care, per MD order.

## 2023-06-22 NOTE — Progress Notes (Signed)
PHARMACY CONSULT NOTE - FOLLOW UP  Pharmacy Consult for Electrolyte Monitoring and Replacement   Recent Labs: Potassium (mmol/L)  Date Value  06/08/2023 3.0 (L)  01/20/2012 4.1   Magnesium (mg/dL)  Date Value  57/84/6962 2.3   Calcium (mg/dL)  Date Value  95/28/4132 8.7 (L)   Calcium, Total (mg/dL)  Date Value  44/10/270 8.5   Albumin (g/dL)  Date Value  53/66/4403 2.8 (L)  06/05/2015 3.8   Sodium (mmol/L)  Date Value  05/29/2023 132 (L)  08/23/2021 142  01/20/2012 143     Assessment: 8/24:  K @ 0313 = 3.0 (K resulted at 0215 was from hemolyzed sample so value is dubious)            Ca = 8.7,  Alb = 2.8,  Corrected Ca = 9.66  Goal of Therapy:  Electrolytes WNL   Plan:  Will order KCl 10 mEq IV X 4 and recheck electrolytes on 8/24 @ 1000.   Scherrie Gerlach ,PharmD Clinical Pharmacist 06/16/2023 3:38 AM

## 2023-06-22 NOTE — Progress Notes (Signed)
Time of death called with Charna Elizabeth, RN [phone]. Spouse and other family present at bedside.

## 2023-06-22 NOTE — Progress Notes (Signed)
Pharmacy Antibiotic Note  William Sheth. is a 77 y.o. male admitted on 06/08/2023 with  aspiration PNA .  Pharmacy has been consulted for Unasyn dosing.  Plan: Unasyn 3 gm IV Q6H ordered to start on 8/24 @ ~ 0600.   Height: 6' 0.01" (182.9 cm) IBW/kg (Calculated) : 77.62  Temp (24hrs), Avg:98 F (36.7 C), Min:98 F (36.7 C), Max:98 F (36.7 C)  Recent Labs  Lab 06/19/2023 0215 06/12/2023 0458  WBC 22.0* 26.7*  CREATININE 1.34*  --     Estimated Creatinine Clearance: 63.4 mL/min (A) (by C-G formula based on SCr of 1.34 mg/dL (H)).    Allergies  Allergen Reactions   Other     LONG QT SYNDROME--MANY DRUGS PATIENT HAS TO AVOID SEE WEBSITE FOR DETAILS Http://www.sads.org.uk/drugs_to_avoid.htm     Antimicrobials this admission:   >>    >>   Dose adjustments this admission:   Microbiology results:  BCx:   UCx:    Sputum:    MRSA PCR:   Thank you for allowing pharmacy to be a part of this patient's care.  William Woods D 06/05/2023 6:05 AM

## 2023-06-22 NOTE — Progress Notes (Signed)
ANTICOAGULATION CONSULT NOTE - Initial Consult  Pharmacy Consult for Heparin  Indication: atrial fibrillation  Allergies  Allergen Reactions   Other     LONG QT SYNDROME--MANY DRUGS PATIENT HAS TO AVOID SEE WEBSITE FOR DETAILS Http://www.sads.org.uk/drugs_to_avoid.htm     Patient Measurements:   Heparin Dosing Weight: 103.5 kg   Vital Signs: Temp: 98 F (36.7 C) (08/24 0400) Temp Source: Axillary (08/24 0400) BP: 133/60 (08/24 0402) Pulse Rate: 61 (08/24 0402)  Labs: Recent Labs    06/06/2023 0215 06/18/2023 0313  HGB 11.4* 11.6*  HCT 36.7* 34.0*  PLT 282  --   APTT 35  --   LABPROT 18.2*  --   INR 1.5*  --   CREATININE 1.34*  --   TROPONINIHS 99*  --     Estimated Creatinine Clearance: 63.4 mL/min (A) (by C-G formula based on SCr of 1.34 mg/dL (H)).   Medical History: Past Medical History:  Diagnosis Date   CHB (complete heart block) (HCC)    Edema    Suspect diastolic heart failure   Hypertension    Long Q-T syndrome    Obesity (BMI 30-39.9)    Obstructive sleep apnea    Pacemaker    Medtronic Adapta L model ADDRL 1 (serial number [id number]) pacemaker.   Syncope    Thyroid disease     Medications:  Medications Prior to Admission  Medication Sig Dispense Refill Last Dose   apixaban (ELIQUIS) 5 MG TABS tablet Take 1 tablet (5 mg total) by mouth 2 (two) times daily. 60 tablet 5    Ascorbic Acid (VITAMIN C) 1000 MG tablet Take 1,000 mg by mouth 2 (two) times daily.      Coenzyme Q10 (CO Q-10 PO) Take by mouth.      furosemide (LASIX) 80 MG tablet Take 1 tablet (80 mg total) by mouth daily. 90 tablet 0    losartan (COZAAR) 25 MG tablet Take 1 tablet (25 mg total) by mouth daily. 90 tablet 1    MAGNESIUM PO Take by mouth. (Patient not taking: Reported on 06/04/2023)      meloxicam (MOBIC) 15 MG tablet Take 15 mg by mouth daily.      metoprolol succinate (TOPROL-XL) 25 MG 24 hr tablet Take 1 tablet (25 mg total) by mouth daily. 90 tablet 3    OVER THE  COUNTER MEDICATION Cherry extract daily      spironolactone (ALDACTONE) 25 MG tablet Take 0.5 tablets (12.5 mg total) by mouth daily. 45 tablet 3     Assessment: Pharmacy consulted to dose heparin in this 77 year old male admitted with STEMI/cardiac arrest, S/P cardiac cath.  Pt was on Eliquis 5 mg PO BID PTA, unsure of last dose. CrCl = 63.4 ml/min   Goal of Therapy:  Heparin level 0.3-0.7 units/ml aPTT = 66 - 102  Monitor platelets by anticoagulation protocol: Yes   Plan:  - Heparin to start 8 hrs post sheath removal.  Sheath removed on 8/24 @ 0321.  Heparin 4000 units IV X 1 bolus followed by infusion to run at 1500 units/hr ordered to begin on 8/24 @ ~ 1130. - Was on Eliquis PTA so will use aPTT to guide dosing until correlating with HL - Will draw aPTT 8 hrs after start of infusion - Will check HL on 8/25 with AM labs.   Levan Aloia D 06/11/2023,4:24 AM

## 2023-06-22 NOTE — IPAL (Signed)
  Interdisciplinary Goals of Care Family Meeting   Date carried out: 06/04/2023  Location of the meeting: Bedside  Member's involved: Physician, Bedside Registered Nurse, and Family Member or next of kin      GOALS OF CARE DISCUSSION  The Clinical status was relayed to family in detail- Wife and Brother  Updated and notified of patients medical condition- Patient remains unresponsive and will not open eyes to command.    Explained to family course of therapy and the modalities   Patient with Progressive multiorgan failure with a very high probablity of a very minimal chance of meaningful recovery despite all aggressive and optimal medical therapy.    Family understands the situation. Massive heart attack with severe brain damage Multiple organs falling with broken spine and ribs   They have consented and agreed to DNR. Will proceed to comfort care measures when family arrives  Family are satisfied with Plan of action and management. All questions answered  Additional CC time 45 mins   Arthuro Canelo Santiago Glad, M.D.  [facility]  Medical Director [facility] Medical Director [facility] Cardio-Pulmonary Department

## 2023-06-22 NOTE — ED Notes (Signed)
69fr OG 70 @ lip

## 2023-06-22 NOTE — Progress Notes (Signed)
   06/15/2023 0900  Spiritual Encounters  Type of Visit Initial  Care provided to: Family  Referral source Chaplain team  Reason for visit Urgent spiritual support  OnCall Visit Yes   Chaplain followed up to provided care for family. Two of the family's pastors were providing care to the family. Chaplain services available as needed.

## 2023-06-22 NOTE — ED Provider Notes (Signed)
[facility] Provider Note    Event Date/Time   First MD Initiated Contact with Patient 06/06/2023 [phone]     (approximate)   History   Code STEMI (Pt was found at home via ems. Ems stated they called a stemi in the field. Pt was given amiod. And epi in the field. Pt was intubated in the field. ET 8.0. BVM in progress upon arrival to ER. )   HPI  William Woods. is a 77 y.o. male who presents to the ED for evaluation of Code STEMI (Pt was found at home via ems. Ems stated they called a stemi in the field. Pt was given amiod. And epi in the field. Pt was intubated in the field. ET 8.0. BVM in progress upon arrival to ER. )   After patient goes up to the Cath Lab I do review the cardiology clinic visit from 8/6.  This was not available during my management of the patient.  Reduced ejection fraction with CRT-D.  OSA on CPAP.  Paroxysmal A-fib and long QT syndrome.  Patient presents to the ED for evaluation of postarrest care.  History is limited due to acuity of condition.  Patient was reportedly seated with his wife when he stood up said he did not feel good and had a witnessed arrest and immediate starting of bystander CPR.  First responders found him in pulseless V-fib and he was defibrillated 3 times, epi x 1 and amiodarone 300+150 mg bolus.  EMS reports return of a more organized rhythm and ROSC after the third defibrillation.  Igel supraglottic airway is placed and is brought to the ED.  He presents with spontaneous respirations and hemodynamically stable with paced rhythm.  Oxygen saturations in the 80s shortly after arrival and so transition to ETT with improvement of his oxygenation.  Shortly after medications for intubation, etomidate, rocuronium and fentanyl, he develops increasing ectopy, loses pulses and we restarted CPR briefly.  1 round of CPR in the ED, epi x 1, calcium x 1.  Defibrillation x 1 at 200 J.  ROSC after this.  While still with pulses, shortly  after this brief arrest, he then developed ventricular tachycardia and gets 100 mg of lidocaine x1 and synchronized cardioversion x 2 with return to a more organized rhythm after the second cardioversion.    Physical Exam   Triage Vital Signs: ED Triage Vitals [05/26/2023 0219]  Encounter Vitals Group     BP (!) 103/59     Systolic BP Percentile      Diastolic BP Percentile      Pulse Rate (!) 106     Resp 18     Temp      Temp src      SpO2 98 %     Weight      Height      Head Circumference      Peak Flow      Pain Score      Pain Loc      Pain Education      Exclude from Growth Chart     Most recent vital signs: Vitals:   06/16/2023 0240 06/13/2023 0248  BP:    Pulse: (!) 57   Resp: (!) 29   SpO2: 93% 95%    General: Unresponsive CV:  Good peripheral perfusion.  Resp:  Normal effort.  Breathing spontaneously Abd:  No distention.  MSK:  No deformity noted.  Minimal edema Neuro:  No focal deficits appreciated.  Other:     ED Results / Procedures / Treatments   Labs (all labs ordered are listed, but only abnormal results are displayed) Labs Reviewed  PROTIME-INR  APTT  COMPREHENSIVE METABOLIC PANEL  CBC WITH DIFFERENTIAL/PLATELET  BRAIN NATRIURETIC PEPTIDE  MAGNESIUM  URINALYSIS, ROUTINE W REFLEX MICROSCOPIC  TROPONIN I (HIGH SENSITIVITY)    EKG Paced rhythm with a rate of 104 bpm.  Low amplitude.  Left bundle morphology without STEMI by Sgarbossa criteria.  RADIOLOGY 1 view CXR interpreted by me with ETT in good position.  Patchy multifocal airspace disease is noted  Official radiology report(s): DG Chest Portable 1 View  Result Date: 06/18/2023 CLINICAL DATA:  STEMI, status post intubation EXAM: PORTABLE CHEST 1 VIEW COMPARISON:  06/16/2019 FINDINGS: Cardiac shadow is enlarged. Pacing device is again noted and stable. Endotracheal tube is seen 3.8 cm above the carina. Gastric catheter extends into the stomach. Lungs are well aerated bilaterally. Patchy  airspace opacity is noted throughout the right lung as well as within the left apex. No bony abnormality is noted. IMPRESSION: Tubes and lines as described. Patchy airspace opacity in the right lung as well as the left apex. Electronically Signed   By: Alcide Clever M.D.   On: 05/25/2023 02:44    PROCEDURES and INTERVENTIONS:  .1-3 Lead EKG Interpretation  Performed by: Delton Prairie, MD Authorized by: Delton Prairie, MD     Interpretation: abnormal     ECG rate:  105   ECG rate assessment: tachycardic     Rhythm: paced     Ectopy: PVCs     Conduction: normal   Procedure Name: Intubation Date/Time: 06/20/2023 2:49 AM  Performed by: Delton Prairie, MDPre-anesthesia Checklist: Patient identified, Patient being monitored, Emergency Drugs available, Timeout performed and Suction available Oxygen Delivery Method: Non-rebreather mask Preoxygenation: Pre-oxygenation with 100% oxygen Induction Type: Rapid sequence Ventilation: Mask ventilation without difficulty Laryngoscope Size: Glidescope Tube size: 8.0 mm Number of attempts: 1 Airway Equipment and Method: Rigid stylet Placement Confirmation: ETT inserted through vocal cords under direct vision, CO2 detector and Breath sounds checked- equal and bilateral Tube secured with: ETT holder    .Critical Care  Performed by: Delton Prairie, MD Authorized by: Delton Prairie, MD   Critical care provider statement:    Critical care time (minutes):  30   Critical care time was exclusive of:  Separately billable procedures and treating other patients   Critical care was necessary to treat or prevent imminent or life-threatening deterioration of the following conditions:  Cardiac failure, circulatory failure and respiratory failure   Critical care was time spent personally by me on the following activities:  Development of treatment plan with patient or surrogate, discussions with consultants, evaluation of patient's response to treatment, examination of  patient, ordering and review of laboratory studies, ordering and review of radiographic studies, ordering and performing treatments and interventions, pulse oximetry, re-evaluation of patient's condition and review of old charts .Cardioversion  Date/Time: 06/08/2023 2:49 AM  Performed by: Delton Prairie, MD Authorized by: Delton Prairie, MD   Consent:    Consent obtained:  Emergent situation Pre-procedure details:    Cardioversion basis:  Emergent   Rhythm:  Ventricular tachycardia   Electrode placement:  Anterior-posterior Patient sedated: Yes. Refer to sedation procedure documentation for details of sedation.  Attempt one:    Cardioversion mode:  Synchronous   Shock (Joules):  200   Shock outcome:  Conversion to other rhythm Post-procedure details:    Patient status:  Unresponsive   Patient  tolerance of procedure:  Tolerated well, no immediate complications   Medications  norepinephrine (LEVOPHED) 4-5 MG/250ML-% infusion SOLN (0 mcg/kg/min  Stopped 06/21/2023 0241)  amiodarone (NEXTERONE PREMIX) 360-4.14 MG/200ML-% (1.8 mg/mL) IV infusion (60 mg/hr Intravenous New Bag/Given 05/29/2023 0238)    Followed by  amiodarone (NEXTERONE PREMIX) 360-4.14 MG/200ML-% (1.8 mg/mL) IV infusion (has no administration in time range)  norepinephrine (LEVOPHED) 4 mg in dextrose 5 % 250 mL (0.016 mg/mL) infusion (5 mcg/min Intravenous New Bag/Given 05/22/2023 0251)     IMPRESSION / MDM / ASSESSMENT AND PLAN / ED COURSE  I reviewed the triage vital signs and the nursing notes.  Differential diagnosis includes, but is not limited to, ACS, sepsis, pneumonia, cardiac dysrhythmia  {Patient presents with symptoms of an acute illness or injury that is potentially life-threatening.  Patient presents after witnessed cardiac arrest with evidence of ventricular dysrhythmias requiring emergent left heart cath with cardiology.  As documented above, multiple ventricular dysrhythmias with V-fib, V. tach.  Emergently taken  to Cath Lab.   Clinical Course as of 06/04/2023 0252  Sat Jun 14, 2023  0234 After discussing with cardiologist, he is planning to likely take the patient to the Cath Lab.  He did have a brief arrest that required CPR for a couple minutes, got lidocaine bolus, calcium and epi x 1.  Continues to have intermittent ventricular ectopy and we were starting amnio infusion as he already received amnio bolus with EMS. [DS]  2952 Headed up to the cath lab. Strong radial pulse, some ectopy, but remains narrow complex paced rhythm. No pressors. On Amio drip.  [DS]    Clinical Course User Index [DS] Delton Prairie, MD     FINAL CLINICAL IMPRESSION(S) / ED DIAGNOSES   Final diagnoses:  Ventricular fibrillation [facility])  Cardiac arrest [facility])     Rx / DC Orders   ED Discharge Orders     None        Note:  This document was prepared using Dragon voice recognition software and may include unintentional dictation errors.   Delton Prairie, MD 05/27/2023 [phone]

## 2023-06-22 DEATH — deceased

## 2023-06-25 ENCOUNTER — Telehealth: Payer: Medicare Other | Admitting: Oncology

## 2023-07-01 ENCOUNTER — Other Ambulatory Visit (HOSPITAL_COMMUNITY): Payer: Medicare Other

## 2023-07-03 ENCOUNTER — Encounter: Payer: Self-pay | Admitting: Internal Medicine

## 2023-07-03 NOTE — Progress Notes (Signed)
Called and spoke with his wife following his death

## 2023-07-08 ENCOUNTER — Encounter (HOSPITAL_COMMUNITY): Payer: Self-pay

## 2023-07-08 ENCOUNTER — Ambulatory Visit (HOSPITAL_COMMUNITY): Admit: 2023-07-08 | Payer: Medicare Other | Admitting: Cardiology

## 2023-07-08 SURGERY — ATRIAL FIBRILLATION ABLATION
Anesthesia: General

## 2023-07-17 ENCOUNTER — Ambulatory Visit: Payer: Medicare Other | Admitting: Orthopedic Surgery
